# Patient Record
Sex: Female | Born: 1950
Health system: Southern US, Community
[De-identification: ages and names within clinical notes are randomized; demographics above are authoritative.]

## PROBLEM LIST (undated history)

## (undated) DIAGNOSIS — R0600 Dyspnea, unspecified: Secondary | ICD-10-CM

## (undated) DIAGNOSIS — E119 Type 2 diabetes mellitus without complications: Secondary | ICD-10-CM

## (undated) DIAGNOSIS — I10 Essential (primary) hypertension: Secondary | ICD-10-CM

## (undated) DIAGNOSIS — E785 Hyperlipidemia, unspecified: Secondary | ICD-10-CM

## (undated) DIAGNOSIS — D649 Anemia, unspecified: Secondary | ICD-10-CM

## (undated) DIAGNOSIS — Z9289 Personal history of other medical treatment: Secondary | ICD-10-CM

## (undated) HISTORY — PX: TONSILLECTOMY: SUR1361

## (undated) HISTORY — DX: Essential (primary) hypertension: I10

## (undated) HISTORY — DX: Type 2 diabetes mellitus without complications: E11.9

## (undated) HISTORY — DX: Hyperlipidemia, unspecified: E78.5

## (undated) HISTORY — PX: OTHER SURGICAL HISTORY: SHX169

---

## 1997-06-29 ENCOUNTER — Ambulatory Visit (HOSPITAL_COMMUNITY): Admission: RE | Admit: 1997-06-29 | Discharge: 1997-06-29 | Payer: Self-pay | Admitting: Family Medicine

## 1998-08-02 ENCOUNTER — Encounter: Payer: Self-pay | Admitting: Family Medicine

## 1998-08-02 ENCOUNTER — Ambulatory Visit (HOSPITAL_COMMUNITY): Admission: RE | Admit: 1998-08-02 | Discharge: 1998-08-02 | Payer: Self-pay | Admitting: Family Medicine

## 1999-08-29 ENCOUNTER — Ambulatory Visit (HOSPITAL_COMMUNITY): Admission: RE | Admit: 1999-08-29 | Discharge: 1999-08-29 | Payer: Self-pay | Admitting: Family Medicine

## 1999-08-29 ENCOUNTER — Encounter: Payer: Self-pay | Admitting: Family Medicine

## 1999-10-11 ENCOUNTER — Other Ambulatory Visit: Admission: RE | Admit: 1999-10-11 | Discharge: 1999-10-11 | Payer: Self-pay | Admitting: Family Medicine

## 1999-10-13 ENCOUNTER — Encounter: Payer: Self-pay | Admitting: Family Medicine

## 1999-10-13 ENCOUNTER — Encounter: Admission: RE | Admit: 1999-10-13 | Discharge: 1999-10-13 | Payer: Self-pay | Admitting: Family Medicine

## 2001-02-25 ENCOUNTER — Encounter: Payer: Self-pay | Admitting: Family Medicine

## 2001-02-25 ENCOUNTER — Ambulatory Visit (HOSPITAL_COMMUNITY): Admission: RE | Admit: 2001-02-25 | Discharge: 2001-02-25 | Payer: Self-pay | Admitting: Family Medicine

## 2002-02-26 ENCOUNTER — Ambulatory Visit (HOSPITAL_COMMUNITY): Admission: RE | Admit: 2002-02-26 | Discharge: 2002-02-26 | Payer: Self-pay | Admitting: Family Medicine

## 2002-02-26 ENCOUNTER — Encounter: Payer: Self-pay | Admitting: Family Medicine

## 2003-03-01 ENCOUNTER — Ambulatory Visit (HOSPITAL_COMMUNITY): Admission: RE | Admit: 2003-03-01 | Discharge: 2003-03-01 | Payer: Self-pay | Admitting: Family Medicine

## 2003-04-12 ENCOUNTER — Other Ambulatory Visit: Admission: RE | Admit: 2003-04-12 | Discharge: 2003-04-12 | Payer: Self-pay | Admitting: Family Medicine

## 2004-01-31 ENCOUNTER — Encounter (INDEPENDENT_AMBULATORY_CARE_PROVIDER_SITE_OTHER): Payer: Self-pay | Admitting: *Deleted

## 2004-01-31 ENCOUNTER — Ambulatory Visit (HOSPITAL_COMMUNITY): Admission: RE | Admit: 2004-01-31 | Discharge: 2004-01-31 | Payer: Self-pay | Admitting: Gastroenterology

## 2004-03-28 ENCOUNTER — Ambulatory Visit (HOSPITAL_COMMUNITY): Admission: RE | Admit: 2004-03-28 | Discharge: 2004-03-28 | Payer: Self-pay | Admitting: Family Medicine

## 2005-05-02 ENCOUNTER — Ambulatory Visit (HOSPITAL_COMMUNITY): Admission: RE | Admit: 2005-05-02 | Discharge: 2005-05-02 | Payer: Self-pay | Admitting: Family Medicine

## 2005-05-10 ENCOUNTER — Other Ambulatory Visit: Admission: RE | Admit: 2005-05-10 | Discharge: 2005-05-10 | Payer: Self-pay | Admitting: Family Medicine

## 2006-05-14 ENCOUNTER — Ambulatory Visit (HOSPITAL_COMMUNITY): Admission: RE | Admit: 2006-05-14 | Discharge: 2006-05-14 | Payer: Self-pay | Admitting: Family Medicine

## 2006-05-24 ENCOUNTER — Other Ambulatory Visit: Admission: RE | Admit: 2006-05-24 | Discharge: 2006-05-24 | Payer: Self-pay | Admitting: Family Medicine

## 2007-08-19 ENCOUNTER — Ambulatory Visit (HOSPITAL_COMMUNITY): Admission: RE | Admit: 2007-08-19 | Discharge: 2007-08-19 | Payer: Self-pay | Admitting: Family Medicine

## 2007-09-09 ENCOUNTER — Other Ambulatory Visit: Admission: RE | Admit: 2007-09-09 | Discharge: 2007-09-09 | Payer: Self-pay | Admitting: Family Medicine

## 2008-10-28 ENCOUNTER — Ambulatory Visit (HOSPITAL_COMMUNITY): Admission: RE | Admit: 2008-10-28 | Discharge: 2008-10-28 | Payer: Self-pay | Admitting: Family Medicine

## 2008-11-23 ENCOUNTER — Other Ambulatory Visit: Admission: RE | Admit: 2008-11-23 | Discharge: 2008-11-23 | Payer: Self-pay | Admitting: Family Medicine

## 2010-01-23 ENCOUNTER — Ambulatory Visit (HOSPITAL_COMMUNITY): Admission: RE | Admit: 2010-01-23 | Discharge: 2010-01-23 | Payer: Self-pay | Admitting: Family Medicine

## 2010-03-21 ENCOUNTER — Other Ambulatory Visit
Admission: RE | Admit: 2010-03-21 | Discharge: 2010-03-21 | Payer: Self-pay | Source: Home / Self Care | Admitting: Family Medicine

## 2010-05-12 ENCOUNTER — Other Ambulatory Visit: Payer: Self-pay | Admitting: Dermatology

## 2010-08-04 NOTE — Op Note (Signed)
Courtney Braun, Courtney Braun NO.:  [id number]   MEDICAL RECORD NO.:  [id number]          PATIENT TYPE:  AMB   LOCATION:  ENDO                         FACILITY:  [facility]   PHYSICIAN:  Jeryl Columbia, M.D.    DATE OF BIRTH:  February 07, 1951   DATE OF PROCEDURE:  01/31/2004  DATE OF DISCHARGE:                                 OPERATIVE REPORT   PROCEDURE:  EGD with biopsy.   INDICATIONS FOR PROCEDURE:  Patient with episodic upper tract symptoms,  longstanding.  Want to rule out Barrett's or other etiologies.  Consent was  signed after the risks, benefits, methods, and options were thoroughly  discussed in the office.   MEDICATIONS GIVEN:  Fentanyl 25 mcg, Versed 2 mg.   DESCRIPTION OF PROCEDURE:  The video endoscope was inserted by direct  vision.  The esophagus was normal.  On the distal esophagus was a tiny  hiatal hernia.  The scope passed into the stomach and into the antrum where  some minimal antritis was seen.  It was advanced through a normal pylorus  into a normal duodenal bulb and around the __________ to a normal second  portion of the duodenum.  The scope was withdrawn back to the bulb, and a  good look there __________ location.  The scope was withdrawn back into the  stomach and retroflexed.  The angularis, cardia, and fundus were pertinent  for hiatal hernia being confirmed in the cardia as well as some proximal  gastritis.  Straight visualization of the stomach did not reveal any  additional findings, except for the proximal greater than distal gastritis.  Retroflexion of the lesser and greater curve, again, did not show any  additional findings.  We went ahead and took a few biopsies of the antrum, a  few of the proximal stomach to confirm the gastritis and rule out  Helicobacter.  Air was suctioned, and the scope was slowly withdrawn.  Again, a good look at the esophagus was normal.  The scope was removed.  The  patient tolerated the procedure well.  There were  no obvious immediate  complications.   ENDOSCOPIC DIAGNOSES:  1.  Tiny hiatal hernia.  2.  Minimal proximal gastritis and mild antritis status post biopsy.  3.  Otherwise normal esophagogastroduodenoscopy.   PLAN:  1.  Consider pump inhibitor use p.r.n.  2.  Await pathology.  3.  Happy to see back p.r.n.;  otherwise, return care to Dr. Roderick Pee for      the customary health management to include yearly rectals and guaiacs.      MEM/MEDQ  D:  01/31/2004  T:  01/31/2004  Job:  [id number]   cc:   Osvaldo Human, M.D.  [address]  [place] [address]  Fax: [phone]

## 2010-08-04 NOTE — Op Note (Signed)
NAMETERREA, BRUSTER NO.:  [id number]   MEDICAL RECORD NO.:  [id number]          PATIENT TYPE:  AMB   LOCATION:  ENDO                         FACILITY:  [facility]   PHYSICIAN:  Jeryl Columbia, M.D.    DATE OF BIRTH:  1951-02-21   DATE OF PROCEDURE:  01/31/2004  DATE OF DISCHARGE:                                 OPERATIVE REPORT   PROCEDURE:  Colonoscopy.   INDICATION:  Screening.  Consent was signed after risks, benefits, methods,  options thoroughly discussed in the office.   MEDICINES USED:  1.  Fentanyl 75 mcg.  2.  Versed 7 mg.   DESCRIPTION OF PROCEDURE:  Rectal inspection was pertinent for external  hemorrhoids, small.  Digital exam was negative.  The video colonoscope was  inserted, easily advanced around the colon to the cecum.  This did require  abdominal pressure but no position changes.  No abnormality was seen on  insertion.  The cecum was identified by the appendiceal orifice and the  ileocecal valve.  The scope was slowly withdrawn.  The prep was adequate.  It did require a liter of washing and suctioning for adequate visualization  but on slow withdrawal through the colon, the cecum and the ascending were  normal.  In the mid transverse, a questionable small sessile polyp was seen  and was hot biopsied x 1.  The scope was further withdrawn.  There were a  few very rare tiny left-sided diverticula just beginning but no other  abnormalities.  Once back in the rectum, anorectal pull-through and  retroflexion confirmed some small hemorrhoids.  The scope was straightened  and readvanced a short ways up the left side of the colon; air was  suctioned, scope removed.  The patient tolerated the procedure well.  There  was no obvious immediate complication.   ENDOSCOPIC DIAGNOSES:  1.  Internal and external hemorrhoids.  2.  Very rare tiny left-sided diverticula.  3.  Questionable transverse small polyp, hot biopsied.  4.  Otherwise, within normal limits  to the cecum.   PLAN:  1.  Await pathology but probably recheck colon screening in 5 years.  2.  Continue work-up with a one-time EGD to rule out Barrett's and other      etiologies.      MEM/MEDQ  D:  01/31/2004  T:  01/31/2004  Job:  [id number]   cc:   Osvaldo Human, M.D.  [address]  [place] [address]  Fax: [phone]

## 2011-03-28 ENCOUNTER — Other Ambulatory Visit (HOSPITAL_COMMUNITY): Payer: Self-pay | Admitting: Family Medicine

## 2013-04-27 ENCOUNTER — Other Ambulatory Visit (HOSPITAL_COMMUNITY): Payer: Self-pay | Admitting: Family Medicine

## 2013-04-27 DIAGNOSIS — Z1231 Encounter for screening mammogram for malignant neoplasm of breast: Secondary | ICD-10-CM

## 2013-04-29 ENCOUNTER — Ambulatory Visit (HOSPITAL_COMMUNITY)
Admission: RE | Admit: 2013-04-29 | Discharge: 2013-04-29 | Disposition: A | Discharge status: Home / Self Care | Payer: Medicare PPO | Source: Ambulatory Visit | Attending: Family Medicine | Admitting: Family Medicine

## 2013-04-29 DIAGNOSIS — Z1231 Encounter for screening mammogram for malignant neoplasm of breast: Secondary | ICD-10-CM | POA: Insufficient documentation

## 2013-06-04 ENCOUNTER — Encounter: Payer: Medicare PPO | Attending: Family Medicine

## 2013-06-04 VITALS — Ht 72.0 in | Wt 267.6 lb

## 2013-06-04 DIAGNOSIS — Z713 Dietary counseling and surveillance: Secondary | ICD-10-CM | POA: Insufficient documentation

## 2013-06-04 DIAGNOSIS — E119 Type 2 diabetes mellitus without complications: Secondary | ICD-10-CM

## 2013-06-04 NOTE — Progress Notes (Signed)
Patient was seen on 06/04/13 for the first of a series of three diabetes self-management courses at the [facility].  Current HbA1c: 8.5%  The following learning objectives were met by the patient during this class:  Describe diabetes  State some common risk factors for diabetes  Defines the role of glucose and insulin  Identifies type of diabetes and pathophysiology  Describe the relationship between diabetes and cardiovascular risk  State the members of the Healthcare Team  States the rationale for glucose monitoring  State when to test glucose  State their individual Target Range  State the importance of logging glucose readings  Describe how to interpret glucose readings  Identifies A1C target  Explain the correlation between A1c and eAG values  State symptoms and treatment of high blood glucose  State symptoms and treatment of low blood glucose  Explain proper technique for glucose testing  Identifies proper sharps disposal  Handouts given during class include:  Living Well with Diabetes book  Carb Counting and Meal Planning book  Meal Plan Card  Carbohydrate guide  Meal planning worksheet  Low Sodium Flavoring Tips  The diabetes portion plate  D4Y to eAG Conversion Chart  Diabetes Medications  Diabetes Recommended Care Schedule  Support Group  Diabetes Success Plan  Core Class Satisfaction Survey  Follow-Up Plan:  Attend core 2

## 2013-06-11 DIAGNOSIS — E119 Type 2 diabetes mellitus without complications: Secondary | ICD-10-CM

## 2013-06-11 NOTE — Progress Notes (Signed)
Patient was seen on 06/10/13 for the second of a series of three diabetes self-management courses at the [facility]. The following learning objectives were met by the patient during this class:   Describe the role of different macronutrients on glucose  Explain how carbohydrates affect blood glucose  State what foods contain the most carbohydrates  Demonstrate carbohydrate counting  Demonstrate how to read Nutrition Facts food label  Describe effects of various fats on heart health  Describe the importance of good nutrition for health and healthy eating strategies  Describe techniques for managing your shopping, cooking and meal planning  List strategies to follow meal plan when dining out  Describe the effects of alcohol on glucose and how to use it safely  Goals:  Follow Diabetes Meal Plan as instructed  Eat 3 meals and 2 snacks, every 3-5 hrs  Aim for carbohydrate intake to 45 grams carbohydrate/meal Aim for carbohydrate intake to 15 grams carbohydrate/snack Add lean protein foods to meals/snacks  Monitor glucose levels as instructed by your doctor   Follow-Up Plan:  Attend Core 3  Work towards following your personal food plan.

## 2013-06-18 ENCOUNTER — Encounter: Payer: Medicare PPO | Attending: Family Medicine

## 2013-06-18 DIAGNOSIS — E119 Type 2 diabetes mellitus without complications: Secondary | ICD-10-CM

## 2013-06-18 DIAGNOSIS — Z713 Dietary counseling and surveillance: Secondary | ICD-10-CM | POA: Insufficient documentation

## 2013-06-18 NOTE — Progress Notes (Signed)
Patient was seen on 06/18/13 for the third of a series of three diabetes self-management courses at the [facility]. The following learning objectives were met by the patient during this class:    State the amount of activity recommended for healthy living   Describe activities suitable for individual needs   Identify ways to regularly incorporate activity into daily life   Identify barriers to activity and ways to over come these barriers  Identify diabetes medications being personally used and their primary action for lowering glucose and possible side effects   Describe role of stress on blood glucose and develop strategies to address psychosocial issues   Identify diabetes complications and ways to prevent them  Explain how to manage diabetes during illness   Evaluate success in meeting personal goal   Establish 2-3 goals that they will plan to diligently work on until they return for the  33-monthfollow-up visit  Goals:  Follow Diabetes Meal Plan as instructed  Aim for 15-30 mins of physical activity daily as tolerated  Bring food record and glucose log to your follow up visit  Your patient has established the following 4 month goals in their individualized success plan: I will count my carb choices at most meals and snacks I will increase my activity level at least 30 minutes 3 days a week I will take my diabetes medications as scheduled I will test my glucose at least 2 times a day, 7 days a week I will look at patterns in my record book at least 1 days a week To help manage stress I will do physical activity at least 3 times a week  Your patient has identified these potential barriers to change:  None noted  Your patient has identified their diabetes self-care support plan as  [facility]  Plan:  Attend Core 4 in 4 months

## 2013-10-19 ENCOUNTER — Encounter: Payer: Medicare PPO | Attending: Family Medicine

## 2013-10-19 DIAGNOSIS — E119 Type 2 diabetes mellitus without complications: Secondary | ICD-10-CM | POA: Diagnosis present

## 2013-10-19 DIAGNOSIS — Z713 Dietary counseling and surveillance: Secondary | ICD-10-CM | POA: Diagnosis present

## 2013-10-20 NOTE — Progress Notes (Signed)
Patient was seen on 10/19/2013 for a review of the series of three diabetes self-management courses at the [facility]. The following learning objectives were met by the patient during this class:    Reviewed blood glucose monitoring and interpretation including the recommended target ranges and Hgb A1c.    Reviewed on carb counting, importance of regularly scheduled meals/snacks, and meal planning.    Reviewed the effects of physical activity on glucose levels and long-term glucose control.  Recommended goal of 150 minutes of physical activity/week.   Reviewed patient medications and discussed role of medication on blood glucose and possible side effects.   Discussed strategies to manage stress, psychosocial issues, and other obstacles to diabetes management.   Encouraged moderate weight reduction to improve glucose levels.     Reviewed short-term complications: hyper- and hypo-glycemia.  Discussed causes, symptoms, and treatment options.   Reviewed prevention, detection, and treatment of long-term complications.  Discussed the role of prolonged elevated glucose levels on body systems.  Goals:  Follow Diabetes Meal Plan as instructed  Eat 3 meals and 2 snacks, every 3-5 hrs  Limit carbohydrate intake to 45 grams carbohydrate/meal Limit carbohydrate intake to 15 grams carbohydrate/snack Add lean protein foods to meals/snacks  Monitor glucose levels as instructed by your doctor  Aim for goal of 15-30 mins of physical activity daily as tolerated  Bring food record and glucose log to your next nutrition visit   

## 2013-10-20 NOTE — Patient Instructions (Signed)
Goals:  Follow Diabetes Meal Plan as instructed  Eat 3 meals and 2 snacks, every 3-5 hrs  Limit carbohydrate intake to 45 grams carbohydrate/meal Limit carbohydrate intake to 15 grams carbohydrate/snack Add lean protein foods to meals/snacks  Monitor glucose levels as instructed by your doctor  Aim for goal of 15-30 mins of physical activity daily as tolerated  Bring food record and glucose log to your next nutrition visit

## 2014-01-21 ENCOUNTER — Other Ambulatory Visit: Payer: Self-pay | Admitting: Gastroenterology

## 2014-04-22 ENCOUNTER — Other Ambulatory Visit (HOSPITAL_COMMUNITY): Payer: Self-pay | Admitting: Family Medicine

## 2014-04-22 DIAGNOSIS — Z1231 Encounter for screening mammogram for malignant neoplasm of breast: Secondary | ICD-10-CM

## 2014-04-30 ENCOUNTER — Ambulatory Visit (HOSPITAL_COMMUNITY): Payer: Medicare PPO

## 2014-05-07 ENCOUNTER — Ambulatory Visit (HOSPITAL_COMMUNITY)
Admission: RE | Admit: 2014-05-07 | Discharge: 2014-05-07 | Disposition: A | Discharge status: Home / Self Care | Payer: Medicare PPO | Source: Ambulatory Visit | Attending: Family Medicine | Admitting: Family Medicine

## 2014-05-07 DIAGNOSIS — Z1231 Encounter for screening mammogram for malignant neoplasm of breast: Secondary | ICD-10-CM | POA: Insufficient documentation

## 2014-05-07 DIAGNOSIS — I10 Essential (primary) hypertension: Secondary | ICD-10-CM | POA: Diagnosis not present

## 2014-05-07 DIAGNOSIS — Z0000 Encounter for general adult medical examination without abnormal findings: Secondary | ICD-10-CM | POA: Diagnosis not present

## 2014-05-07 DIAGNOSIS — E782 Mixed hyperlipidemia: Secondary | ICD-10-CM | POA: Diagnosis not present

## 2014-05-07 DIAGNOSIS — Z23 Encounter for immunization: Secondary | ICD-10-CM | POA: Diagnosis not present

## 2014-05-07 DIAGNOSIS — R748 Abnormal levels of other serum enzymes: Secondary | ICD-10-CM | POA: Diagnosis not present

## 2014-05-07 DIAGNOSIS — E119 Type 2 diabetes mellitus without complications: Secondary | ICD-10-CM | POA: Diagnosis not present

## 2014-05-07 DIAGNOSIS — Z6836 Body mass index (BMI) 36.0-36.9, adult: Secondary | ICD-10-CM | POA: Diagnosis not present

## 2014-05-12 DIAGNOSIS — H25093 Other age-related incipient cataract, bilateral: Secondary | ICD-10-CM | POA: Diagnosis not present

## 2014-05-12 DIAGNOSIS — H43392 Other vitreous opacities, left eye: Secondary | ICD-10-CM | POA: Diagnosis not present

## 2014-07-19 ENCOUNTER — Ambulatory Visit
Admission: RE | Admit: 2014-07-19 | Discharge: 2014-07-19 | Disposition: A | Discharge status: Home / Self Care | Payer: Commercial Managed Care - HMO | Source: Ambulatory Visit | Attending: Physician Assistant | Admitting: Physician Assistant

## 2014-07-19 ENCOUNTER — Other Ambulatory Visit: Payer: Self-pay | Admitting: Physician Assistant

## 2014-07-19 DIAGNOSIS — W19XXXA Unspecified fall, initial encounter: Secondary | ICD-10-CM

## 2014-07-19 DIAGNOSIS — M25511 Pain in right shoulder: Secondary | ICD-10-CM | POA: Diagnosis not present

## 2014-07-19 DIAGNOSIS — S2231XA Fracture of one rib, right side, initial encounter for closed fracture: Secondary | ICD-10-CM | POA: Diagnosis not present

## 2014-07-19 DIAGNOSIS — R0789 Other chest pain: Secondary | ICD-10-CM | POA: Diagnosis not present

## 2014-07-19 DIAGNOSIS — S4991XA Unspecified injury of right shoulder and upper arm, initial encounter: Secondary | ICD-10-CM | POA: Diagnosis not present

## 2014-07-19 DIAGNOSIS — S40811A Abrasion of right upper arm, initial encounter: Secondary | ICD-10-CM | POA: Diagnosis not present

## 2014-07-19 DIAGNOSIS — Z23 Encounter for immunization: Secondary | ICD-10-CM | POA: Diagnosis not present

## 2014-09-01 DIAGNOSIS — L03116 Cellulitis of left lower limb: Secondary | ICD-10-CM | POA: Diagnosis not present

## 2014-11-25 DIAGNOSIS — Z23 Encounter for immunization: Secondary | ICD-10-CM | POA: Diagnosis not present

## 2014-11-25 DIAGNOSIS — Z6835 Body mass index (BMI) 35.0-35.9, adult: Secondary | ICD-10-CM | POA: Diagnosis not present

## 2014-11-25 DIAGNOSIS — E782 Mixed hyperlipidemia: Secondary | ICD-10-CM | POA: Diagnosis not present

## 2014-11-25 DIAGNOSIS — E119 Type 2 diabetes mellitus without complications: Secondary | ICD-10-CM | POA: Diagnosis not present

## 2014-11-25 DIAGNOSIS — I10 Essential (primary) hypertension: Secondary | ICD-10-CM | POA: Diagnosis not present

## 2014-12-29 DIAGNOSIS — H524 Presbyopia: Secondary | ICD-10-CM | POA: Diagnosis not present

## 2014-12-29 DIAGNOSIS — H5210 Myopia, unspecified eye: Secondary | ICD-10-CM | POA: Diagnosis not present

## 2015-03-25 DIAGNOSIS — E119 Type 2 diabetes mellitus without complications: Secondary | ICD-10-CM | POA: Diagnosis not present

## 2015-03-25 DIAGNOSIS — Z6835 Body mass index (BMI) 35.0-35.9, adult: Secondary | ICD-10-CM | POA: Diagnosis not present

## 2015-03-25 DIAGNOSIS — I10 Essential (primary) hypertension: Secondary | ICD-10-CM | POA: Diagnosis not present

## 2015-03-25 DIAGNOSIS — E782 Mixed hyperlipidemia: Secondary | ICD-10-CM | POA: Diagnosis not present

## 2015-03-25 DIAGNOSIS — Z7984 Long term (current) use of oral hypoglycemic drugs: Secondary | ICD-10-CM | POA: Diagnosis not present

## 2015-04-26 ENCOUNTER — Other Ambulatory Visit: Payer: Self-pay

## 2015-04-26 DIAGNOSIS — Z1231 Encounter for screening mammogram for malignant neoplasm of breast: Secondary | ICD-10-CM

## 2015-06-14 ENCOUNTER — Ambulatory Visit
Admission: RE | Admit: 2015-06-14 | Discharge: 2015-06-14 | Disposition: A | Discharge status: Home / Self Care | Payer: Commercial Managed Care - HMO | Source: Ambulatory Visit

## 2015-06-14 DIAGNOSIS — Z1231 Encounter for screening mammogram for malignant neoplasm of breast: Secondary | ICD-10-CM

## 2015-08-03 DIAGNOSIS — L309 Dermatitis, unspecified: Secondary | ICD-10-CM | POA: Diagnosis not present

## 2015-08-03 DIAGNOSIS — M8580 Other specified disorders of bone density and structure, unspecified site: Secondary | ICD-10-CM | POA: Diagnosis not present

## 2015-08-03 DIAGNOSIS — R748 Abnormal levels of other serum enzymes: Secondary | ICD-10-CM | POA: Diagnosis not present

## 2015-08-03 DIAGNOSIS — I10 Essential (primary) hypertension: Secondary | ICD-10-CM | POA: Diagnosis not present

## 2015-08-03 DIAGNOSIS — E782 Mixed hyperlipidemia: Secondary | ICD-10-CM | POA: Diagnosis not present

## 2015-08-03 DIAGNOSIS — E119 Type 2 diabetes mellitus without complications: Secondary | ICD-10-CM | POA: Diagnosis not present

## 2015-08-03 DIAGNOSIS — Z0000 Encounter for general adult medical examination without abnormal findings: Secondary | ICD-10-CM | POA: Diagnosis not present

## 2015-08-03 DIAGNOSIS — Z6836 Body mass index (BMI) 36.0-36.9, adult: Secondary | ICD-10-CM | POA: Diagnosis not present

## 2015-09-07 DIAGNOSIS — M8588 Other specified disorders of bone density and structure, other site: Secondary | ICD-10-CM | POA: Diagnosis not present

## 2015-09-26 DIAGNOSIS — L309 Dermatitis, unspecified: Secondary | ICD-10-CM | POA: Diagnosis not present

## 2016-01-16 DIAGNOSIS — H524 Presbyopia: Secondary | ICD-10-CM | POA: Diagnosis not present

## 2016-01-16 DIAGNOSIS — E119 Type 2 diabetes mellitus without complications: Secondary | ICD-10-CM | POA: Diagnosis not present

## 2016-04-25 DIAGNOSIS — I10 Essential (primary) hypertension: Secondary | ICD-10-CM | POA: Diagnosis not present

## 2016-04-25 DIAGNOSIS — E782 Mixed hyperlipidemia: Secondary | ICD-10-CM | POA: Diagnosis not present

## 2016-04-25 DIAGNOSIS — E119 Type 2 diabetes mellitus without complications: Secondary | ICD-10-CM | POA: Diagnosis not present

## 2016-04-30 DIAGNOSIS — E119 Type 2 diabetes mellitus without complications: Secondary | ICD-10-CM | POA: Diagnosis not present

## 2016-07-06 ENCOUNTER — Ambulatory Visit (HOSPITAL_COMMUNITY)
Admission: EM | Admit: 2016-07-06 | Discharge: 2016-07-06 | Disposition: A | Discharge status: Home / Self Care | Payer: Medicare HMO | Attending: Internal Medicine | Admitting: Internal Medicine

## 2016-07-06 ENCOUNTER — Encounter (HOSPITAL_COMMUNITY): Payer: Self-pay | Admitting: Emergency Medicine

## 2016-07-06 DIAGNOSIS — A084 Viral intestinal infection, unspecified: Secondary | ICD-10-CM

## 2016-07-06 DIAGNOSIS — R197 Diarrhea, unspecified: Secondary | ICD-10-CM | POA: Diagnosis not present

## 2016-07-06 DIAGNOSIS — R1110 Vomiting, unspecified: Secondary | ICD-10-CM | POA: Diagnosis not present

## 2016-07-06 MED ORDER — ONDANSETRON 4 MG PO TBDP
ORAL_TABLET | ORAL | Status: AC
  Filled 2016-07-06: qty 2

## 2016-07-06 MED ORDER — DICYCLOMINE HCL 20 MG PO TABS: 20.0000 mg | ORAL_TABLET | Freq: Two times a day (BID) | ORAL | 0 refills | Status: DC

## 2016-07-06 MED ORDER — ONDANSETRON 4 MG PO TBDP
8.0000 mg | ORAL_TABLET | Freq: Once | ORAL | Status: AC
  Administered 2016-07-06: 8 mg via ORAL

## 2016-07-06 MED ORDER — OMEPRAZOLE 40 MG PO CPDR: 40.0000 mg | DELAYED_RELEASE_CAPSULE | Freq: Two times a day (BID) | ORAL | 0 refills | Status: DC

## 2016-07-06 MED ORDER — GI COCKTAIL ~~LOC~~
ORAL | Status: AC
  Filled 2016-07-06: qty 30

## 2016-07-06 MED ORDER — DIPHENOXYLATE-ATROPINE 2.5-0.025 MG PO TABS: 1.0000 | ORAL_TABLET | Freq: Four times a day (QID) | ORAL | 0 refills | Status: DC | PRN

## 2016-07-06 MED ORDER — GI COCKTAIL ~~LOC~~
30.0000 mL | Freq: Once | ORAL | Status: AC
  Administered 2016-07-06: 30 mL via ORAL

## 2016-07-06 MED ORDER — ONDANSETRON 4 MG PO TBDP: 4.0000 mg | ORAL_TABLET | Freq: Three times a day (TID) | ORAL | 0 refills | Status: DC | PRN

## 2016-07-06 NOTE — ED Triage Notes (Signed)
PT reports diarrhea that started at 1600 yesterday. PT reports vomiting started at 0400. PT reports 10 episodes diarrhea and 8 episodes vomiting since onset. PT denies abdominal pain at this time.

## 2016-07-06 NOTE — ED Provider Notes (Signed)
CSN: [id number]     Arrival date & time 07/06/16  1338 History   First MD Initiated Contact with Patient 07/06/16 1413     Chief Complaint  Patient presents with  . Emesis  . Diarrhea   (Consider location/radiation/quality/duration/timing/severity/associated sxs/prior Treatment) 66 year old female presents to clinic for nausea, vomiting, and diarrhea starting last night.   The history is provided by the patient.  Emesis  Severity:  Moderate Duration:  1 day Timing:  Constant Number of daily episodes:  5 Quality:  Stomach contents Progression:  Worsening Chronicity:  New Recent urination:  Normal Context: not post-tussive and not self-induced   Relieved by:  None tried Worsened by:  Food smell Ineffective treatments:  None tried Associated symptoms: abdominal pain and diarrhea   Associated symptoms: no chills, no cough, no fever, no headaches, no myalgias, no sore throat and no URI   Diarrhea:    Quality:  Explosive, malodorous and watery   Number of occurrences:  8-10   Severity:  Moderate   Duration:  1 day   Timing:  Constant   Progression:  Worsening Risk factors: diabetes   Risk factors: no alcohol use, no suspect food intake and no travel to endemic areas   Diarrhea  Associated symptoms: abdominal pain and vomiting   Associated symptoms: no chills, no fever, no headaches, no myalgias and no URI     Past Medical History:  Diagnosis Date  . Diabetes mellitus without complication ([place])   . Hyperlipidemia   . Hypertension    Past Surgical History:  Procedure Laterality Date  . none    . TONSILLECTOMY     Family History  Problem Relation Age of Onset  . Hyperlipidemia Other    Social History  Substance Use Topics  . Smoking status: Never Smoker  . Smokeless tobacco: Never Used  . Alcohol use Yes     Comment: occasionally   OB History    No data available     Review of Systems  Constitutional: Negative for chills and fever.  HENT: Negative for  congestion, sinus pain, sinus pressure, sore throat and trouble swallowing.   Eyes: Negative.   Respiratory: Negative for cough and shortness of breath.   Cardiovascular: Negative.   Gastrointestinal: Positive for abdominal pain, diarrhea and vomiting.  Genitourinary: Negative.   Musculoskeletal: Negative for myalgias, neck pain and neck stiffness.  Skin: Negative.   Neurological: Positive for light-headedness. Negative for dizziness and headaches.  All other systems reviewed and are negative.   Allergies  Patient has no known allergies.  Home Medications   Prior to Admission medications   Medication Sig Start Date End Date Taking? Authorizing Provider  aspirin 81 MG tablet Take 81 mg by mouth daily.   Yes Historical Provider, MD  lisinopril-hydrochlorothiazide (PRINZIDE,ZESTORETIC) 20-12.5 MG per tablet Take 1 tablet by mouth daily.   Yes Historical Provider, MD  metFORMIN (GLUCOPHAGE) 500 MG tablet Take 500 mg by mouth 2 (two) times daily with a meal.   Yes Historical Provider, MD  simvastatin (ZOCOR) 20 MG tablet Take 20 mg by mouth daily.   Yes Historical Provider, MD  dicyclomine (BENTYL) 20 MG tablet Take 1 tablet (20 mg total) by mouth 2 (two) times daily. 07/06/16   Barnet Glasgow, NP  diphenoxylate-atropine (LOMOTIL) 2.5-0.025 MG tablet Take 1 tablet by mouth 4 (four) times daily as needed for diarrhea or loose stools. 07/06/16   Barnet Glasgow, NP  omeprazole (PRILOSEC) 40 MG capsule Take 1 capsule (40  mg total) by mouth 2 (two) times daily. 07/06/16 07/20/16  Barnet Glasgow, NP  ondansetron (ZOFRAN ODT) 4 MG disintegrating tablet Take 1 tablet (4 mg total) by mouth every 8 (eight) hours as needed for nausea or vomiting. 07/06/16   Barnet Glasgow, NP   Meds Ordered and Administered this Visit   Medications  ondansetron (ZOFRAN-ODT) disintegrating tablet 8 mg (8 mg Oral Given 07/06/16 1429)  gi cocktail (Maalox,Lidocaine,Donnatal) (30 mLs Oral Given 07/06/16 1429)    BP  113/66 (BP Location: Right Arm)   Pulse (!) 128   Temp 98.7 F (37.1 C) (Oral)   Resp 18   Ht 6' (1.829 m)   Wt 245 lb (111.1 kg)   SpO2 99%   BMI 33.23 kg/m  No data found.   Physical Exam  Constitutional: She is oriented to person, place, and time. She appears well-developed and well-nourished. No distress.  HENT:  Head: Normocephalic and atraumatic.  Right Ear: External ear normal.  Left Ear: External ear normal.  Eyes: Conjunctivae are normal. Right eye exhibits no discharge. Left eye exhibits no discharge.  Cardiovascular: Normal rate and regular rhythm.   Pulmonary/Chest: Effort normal and breath sounds normal.  Abdominal: Soft. Normal appearance and bowel sounds are normal. There is tenderness in the epigastric area. There is no rebound, no CVA tenderness, no tenderness at McBurney's point and negative Murphy's sign.  Neurological: She is alert and oriented to person, place, and time.  Skin: Skin is warm and dry. Capillary refill takes less than 2 seconds. She is not diaphoretic.  Psychiatric: She has a normal mood and affect. Her behavior is normal.  Nursing note and vitals reviewed.   Urgent Care Course     Procedures (including critical care time)  Labs Review Labs Reviewed - No data to display  Imaging Review No results found.    MDM   1. Viral gastroenteritis     Patient treated in clinic with a GI cocktail, and with Zofran. Prescription sent her pharmacy for Zofran, Prilosec, Bentyl, and Lomotil. Given counseling on clear liquid diet followed by simple foods such as bananas, rice, toast, provided follow-up counseling should her symptoms worsen to go to the emergency room, or return to clinic at anytime if her symptoms fail to resolve. Also encouraged to push fluids.    Barnet Glasgow, NP 07/06/16 1451

## 2016-07-06 NOTE — ED Triage Notes (Signed)
PT reports vomit has been "dark brown" and diarrhea has been black.

## 2016-07-06 NOTE — Discharge Instructions (Signed)
You have viral gastritis. I prescribed medications to help your symptoms.For Nausea, I have prescribed Zofran, take 1 tablet under the tongue every 8 hours as needed. For diarrhea, prescribed loperamide, take one tablet after each loose stool, not to exceed 4 tablets in any 24 hour period. I prescribed a medicine called Bentyl, take one tablet by mouth twice daily. And finally have prescribed a medicine called omeprazole, take 1 tablet by mouth daily. Should your symptoms persist, or fail to improve, follow up with your primary care provider or return to clinic as needed. If your symptoms worsen, particularly if you develop worsened abdominal pain, fever, signs or symptoms of severe dehydration, then go to the emergency room.

## 2016-07-30 DIAGNOSIS — R609 Edema, unspecified: Secondary | ICD-10-CM | POA: Diagnosis not present

## 2016-07-30 DIAGNOSIS — D649 Anemia, unspecified: Secondary | ICD-10-CM | POA: Diagnosis not present

## 2016-07-31 ENCOUNTER — Other Ambulatory Visit: Payer: Self-pay | Admitting: Gastroenterology

## 2016-07-31 DIAGNOSIS — D509 Iron deficiency anemia, unspecified: Secondary | ICD-10-CM

## 2016-07-31 DIAGNOSIS — R748 Abnormal levels of other serum enzymes: Secondary | ICD-10-CM | POA: Diagnosis not present

## 2016-07-31 DIAGNOSIS — D500 Iron deficiency anemia secondary to blood loss (chronic): Secondary | ICD-10-CM | POA: Diagnosis not present

## 2016-07-31 DIAGNOSIS — Z86010 Personal history of colonic polyps: Secondary | ICD-10-CM | POA: Diagnosis not present

## 2016-08-02 ENCOUNTER — Ambulatory Visit (HOSPITAL_COMMUNITY)
Admission: RE | Admit: 2016-08-02 | Discharge: 2016-08-02 | Disposition: A | Discharge status: Home / Self Care | Payer: Medicare HMO | Source: Ambulatory Visit | Attending: Gastroenterology | Admitting: Gastroenterology

## 2016-08-02 DIAGNOSIS — D649 Anemia, unspecified: Secondary | ICD-10-CM | POA: Diagnosis not present

## 2016-08-02 LAB — ABO/RH: ABO/RH(D): B NEG

## 2016-08-02 LAB — PREPARE RBC (CROSSMATCH)

## 2016-08-02 MED ORDER — FUROSEMIDE 10 MG/ML IJ SOLN
20.0000 mg | Freq: Once | INTRAMUSCULAR | Status: AC
  Administered 2016-08-02: 20 mg via INTRAVENOUS
  Filled 2016-08-02: qty 2

## 2016-08-02 MED ORDER — SODIUM CHLORIDE 0.9 % IV SOLN
Freq: Once | INTRAVENOUS | Status: AC
  Administered 2016-08-02: 10:00:00 via INTRAVENOUS

## 2016-08-02 NOTE — Discharge Instructions (Signed)
Blood Transfusion, Care After This sheet gives you information about how to care for yourself after your procedure. Your doctor may also give you more specific instructions. If you have problems or questions, contact your doctor. Follow these instructions at home:  Take over-the-counter and prescription medicines only as told by your doctor.  Go back to your normal activities as told by your doctor.  Follow instructions from your doctor about how to take care of the area where an IV tube was put into your vein (insertion site). Make sure you:  Wash your hands with soap and water before you change your bandage (dressing). If there is no soap and water, use hand sanitizer.  Change your bandage as told by your doctor.  Check your IV insertion site every day for signs of infection. Check for:  More redness, swelling, or pain.  More fluid or blood.  Warmth.  Pus or a bad smell. Contact a doctor if:  You have more redness, swelling, or pain around the IV insertion site..  You have more fluid or blood coming from the IV insertion site.  Your IV insertion site feels warm to the touch.  You have pus or a bad smell coming from the IV insertion site.  Your pee (urine) turns pink, red, or brown.  You feel weak after doing your normal activities. Get help right away if:  You have signs of a serious allergic or body defense (immune) system reaction, including:  Itchiness.  Hives.  Trouble breathing.  Anxiety.  Pain in your chest or lower back.  Fever, flushing, and chills.  Fast pulse.  Rash.  Watery poop (diarrhea).  Throwing up (vomiting).  Dark pee.  Serious headache.  Dizziness.  Stiff neck.  Yellow color in your face or the white parts of your eyes (jaundice). Summary  After a blood transfusion, return to your normal activities as told by your doctor.  Every day, check for signs of infection where the IV tube was put into your vein.  Some signs of  infection are warm skin, more redness and pain, more fluid or blood, and pus or a bad smell where the needle went in.  Contact your doctor if you feel weak or have any unusual symptoms. This information is not intended to replace advice given to you by your health care provider. Make sure you discuss any questions you have with your health care provider. Document Released: 03/26/2014 Document Revised: 10/28/2015 Document Reviewed: 10/28/2015 Elsevier Interactive Patient Education  2017 [employer].   Patient received 2 units of PRBCs today after being typed and screened.

## 2016-08-02 NOTE — Procedures (Signed)
[facility]  Procedure Note  Courtney Braun BTY:[id number] DOB: 12-12-1950 DOA: 08/02/2016   Ordering Provider: Clarene Essex, MD  Associated Diagnosis: Anemia  Procedure Note: Transfusion of 2 units PRBCs; IV push lasix   Condition During Procedure: Pt tolerated well; no complications noted   Condition at Discharge: Pt was alert, oriented, ambulatory; no complications noted  Nigel Sloop, [facility]

## 2016-08-03 ENCOUNTER — Encounter (HOSPITAL_COMMUNITY): Payer: Self-pay | Admitting: *Deleted

## 2016-08-03 ENCOUNTER — Other Ambulatory Visit: Payer: Self-pay | Admitting: Gastroenterology

## 2016-08-03 ENCOUNTER — Ambulatory Visit
Admission: RE | Admit: 2016-08-03 | Discharge: 2016-08-03 | Disposition: A | Discharge status: Home / Self Care | Payer: Medicare HMO | Source: Ambulatory Visit | Attending: Gastroenterology | Admitting: Gastroenterology

## 2016-08-03 DIAGNOSIS — K8000 Calculus of gallbladder with acute cholecystitis without obstruction: Secondary | ICD-10-CM | POA: Diagnosis not present

## 2016-08-03 DIAGNOSIS — D509 Iron deficiency anemia, unspecified: Secondary | ICD-10-CM

## 2016-08-03 LAB — BPAM RBC
Blood Product Expiration Date: 201805302359
Blood Product Expiration Date: 201806012359
ISSUE DATE / TIME: 201805170929
ISSUE DATE / TIME: 201805170929
UNIT TYPE AND RH: 1700
Unit Type and Rh: 1700

## 2016-08-03 LAB — TYPE AND SCREEN
ABO/RH(D): B NEG
ANTIBODY SCREEN: NEGATIVE
UNIT DIVISION: 0
UNIT DIVISION: 0

## 2016-08-03 MED ORDER — IOPAMIDOL (ISOVUE-300) INJECTION 61%
125.0000 mL | Freq: Once | INTRAVENOUS | Status: AC | PRN
  Administered 2016-08-03: 125 mL via INTRAVENOUS

## 2016-08-06 ENCOUNTER — Ambulatory Visit (HOSPITAL_COMMUNITY)
Admission: RE | Admit: 2016-08-06 | Discharge: 2016-08-06 | Disposition: A | Discharge status: Home / Self Care | Payer: Medicare HMO | Source: Ambulatory Visit | Attending: Gastroenterology | Admitting: Gastroenterology

## 2016-08-06 ENCOUNTER — Ambulatory Visit (HOSPITAL_COMMUNITY): Payer: Medicare HMO | Admitting: Certified Registered"

## 2016-08-06 ENCOUNTER — Encounter (HOSPITAL_COMMUNITY): Payer: Self-pay | Admitting: Certified Registered"

## 2016-08-06 ENCOUNTER — Encounter (HOSPITAL_COMMUNITY)
Admission: RE | Disposition: A | Discharge status: Home / Self Care | Payer: Self-pay | Source: Ambulatory Visit | Attending: Gastroenterology

## 2016-08-06 DIAGNOSIS — Z79899 Other long term (current) drug therapy: Secondary | ICD-10-CM | POA: Insufficient documentation

## 2016-08-06 DIAGNOSIS — R748 Abnormal levels of other serum enzymes: Secondary | ICD-10-CM | POA: Insufficient documentation

## 2016-08-06 DIAGNOSIS — K766 Portal hypertension: Secondary | ICD-10-CM | POA: Insufficient documentation

## 2016-08-06 DIAGNOSIS — I10 Essential (primary) hypertension: Secondary | ICD-10-CM | POA: Diagnosis not present

## 2016-08-06 DIAGNOSIS — I8500 Esophageal varices without bleeding: Secondary | ICD-10-CM | POA: Diagnosis not present

## 2016-08-06 DIAGNOSIS — K317 Polyp of stomach and duodenum: Secondary | ICD-10-CM | POA: Diagnosis not present

## 2016-08-06 DIAGNOSIS — Z6838 Body mass index (BMI) 38.0-38.9, adult: Secondary | ICD-10-CM | POA: Diagnosis not present

## 2016-08-06 DIAGNOSIS — Z7984 Long term (current) use of oral hypoglycemic drugs: Secondary | ICD-10-CM | POA: Insufficient documentation

## 2016-08-06 DIAGNOSIS — E119 Type 2 diabetes mellitus without complications: Secondary | ICD-10-CM | POA: Diagnosis not present

## 2016-08-06 DIAGNOSIS — E7800 Pure hypercholesterolemia, unspecified: Secondary | ICD-10-CM | POA: Diagnosis not present

## 2016-08-06 DIAGNOSIS — K449 Diaphragmatic hernia without obstruction or gangrene: Secondary | ICD-10-CM | POA: Diagnosis not present

## 2016-08-06 DIAGNOSIS — D509 Iron deficiency anemia, unspecified: Secondary | ICD-10-CM | POA: Insufficient documentation

## 2016-08-06 DIAGNOSIS — K3189 Other diseases of stomach and duodenum: Secondary | ICD-10-CM | POA: Diagnosis not present

## 2016-08-06 DIAGNOSIS — Z7982 Long term (current) use of aspirin: Secondary | ICD-10-CM | POA: Insufficient documentation

## 2016-08-06 HISTORY — DX: Dyspnea, unspecified: R06.00

## 2016-08-06 HISTORY — DX: Anemia, unspecified: D64.9

## 2016-08-06 HISTORY — PX: ESOPHAGOGASTRODUODENOSCOPY: SHX5428

## 2016-08-06 HISTORY — DX: Personal history of other medical treatment: Z92.89

## 2016-08-06 LAB — GLUCOSE, CAPILLARY
Glucose-Capillary: 110 mg/dL — ABNORMAL HIGH (ref 65–99)
Glucose-Capillary: 107 mg/dL — ABNORMAL HIGH (ref 65–99)

## 2016-08-06 SURGERY — EGD (ESOPHAGOGASTRODUODENOSCOPY)
Anesthesia: Monitor Anesthesia Care

## 2016-08-06 MED ORDER — LACTATED RINGERS IV SOLN
INTRAVENOUS | Status: DC | PRN
Start: 2016-08-06 — End: 2016-08-06
  Administered 2016-08-06: 12:00:00 via INTRAVENOUS

## 2016-08-06 MED ORDER — SODIUM CHLORIDE 0.9 % IV SOLN: INTRAVENOUS | Status: DC

## 2016-08-06 MED ORDER — PROPOFOL 500 MG/50ML IV EMUL
INTRAVENOUS | Status: DC | PRN
  Administered 2016-08-06: 100 ug/kg/min via INTRAVENOUS

## 2016-08-06 MED ORDER — PROPOFOL 10 MG/ML IV BOLUS
INTRAVENOUS | Status: DC | PRN
  Administered 2016-08-06: 20 mg via INTRAVENOUS
  Administered 2016-08-06: 10 mg via INTRAVENOUS

## 2016-08-06 MED ORDER — BUTAMBEN-TETRACAINE-BENZOCAINE 2-2-14 % EX AERO
INHALATION_SPRAY | CUTANEOUS | Status: DC | PRN
  Administered 2016-08-06: 1 via TOPICAL

## 2016-08-06 MED ORDER — LACTATED RINGERS IV SOLN: INTRAVENOUS | Status: DC

## 2016-08-06 MED ORDER — LIDOCAINE HCL (CARDIAC) 20 MG/ML IV SOLN
INTRAVENOUS | Status: DC | PRN
  Administered 2016-08-06: 40 mg via INTRAVENOUS

## 2016-08-06 NOTE — Progress Notes (Signed)
Courtney Braun 10:34 AM  Subjective: Patient doing better since her transfusion without signs of bleeding and no new complaints and has not done her guaiac cards nor has she started her iron and her CT was reviewed and other than her brother alcohol cirrhosis no other liver disease runs in the family and she has only been a social drinker her life Objective: Vital signs stable afebrile exam please see preassessment evaluation no new labs CT reviewed as above  Assessment: Microcytic anemia probably due to her cirrhosis of questionable etiology  Plan: We'll proceed with endoscopy with anesthesia assistance and further workup and plans pending those findings including proceeding with guaiacs then begin iron and repeat labs and proceed with liver serologies and possible paracentesis as well  [phone] After 5PM or if no answer call [phone]

## 2016-08-06 NOTE — Anesthesia Postprocedure Evaluation (Signed)
Anesthesia Post Note  Patient: EYVA CALIFANO  Procedure(s) Performed: Procedure(s) (LRB): ESOPHAGOGASTRODUODENOSCOPY (EGD) (N/A)  Patient location during evaluation: PACU Anesthesia Type: MAC Level of consciousness: awake and alert Pain management: pain level controlled Vital Signs Assessment: post-procedure vital signs reviewed and stable Respiratory status: spontaneous breathing, nonlabored ventilation, respiratory function stable and patient connected to nasal cannula oxygen Cardiovascular status: stable and blood pressure returned to baseline Anesthetic complications: no       Last Vitals:  Vitals:   08/06/16 1200 08/06/16 1210  BP: (!) 120/101 118/87  Pulse: 88 90  Resp: (!) 24 (!) 43  Temp:      Last Pain:  Vitals:   08/06/16 1149  TempSrc: Oral                 Saloma Cadena DAVID

## 2016-08-06 NOTE — Anesthesia Preprocedure Evaluation (Signed)
Anesthesia Evaluation  Patient identified by MRN, date of birth, ID band Patient awake    Reviewed: Allergy & Precautions, NPO status , Patient's Chart, lab work & pertinent test results  Airway Mallampati: I  TM Distance: >3 FB Neck ROM: Full    Dental   Pulmonary    Pulmonary exam normal        Cardiovascular hypertension, Pt. on medications Normal cardiovascular exam     Neuro/Psych    GI/Hepatic   Endo/Other  diabetes, Type 2, Oral Hypoglycemic Agents  Renal/GU      Musculoskeletal   Abdominal   Peds  Hematology   Anesthesia Other Findings   Reproductive/Obstetrics                             Anesthesia Physical Anesthesia Plan  ASA: II  Anesthesia Plan: MAC   Post-op Pain Management:    Induction: Intravenous  Airway Management Planned: Simple Face Mask  Additional Equipment:   Intra-op Plan:   Post-operative Plan:   Informed Consent: I have reviewed the patients History and Physical, chart, labs and discussed the procedure including the risks, benefits and alternatives for the proposed anesthesia with the patient or authorized representative who has indicated his/her understanding and acceptance.     Plan Discussed with: CRNA and Surgeon  Anesthesia Plan Comments:         Anesthesia Quick Evaluation

## 2016-08-06 NOTE — Transfer of Care (Signed)
Immediate Anesthesia Transfer of Care Note  Patient: Courtney Braun  Procedure(s) Performed: Procedure(s): ESOPHAGOGASTRODUODENOSCOPY (EGD) (N/A)  Patient Location: Endoscopy Unit  Anesthesia Type:MAC  Level of Consciousness: awake, alert  and oriented  Airway & Oxygen Therapy: Patient Spontanous Breathing and Patient connected to nasal cannula oxygen  Post-op Assessment: Report given to RN, Post -op Vital signs reviewed and stable and Patient moving all extremities X 4  Post vital signs: Reviewed and stable  Last Vitals:  Vitals:   08/06/16 1006  BP: (!) 131/54  Pulse: 93  Resp: 18  Temp: 36.7 C    Last Pain:  Vitals:   08/06/16 1006  TempSrc: Oral         Complications: No apparent anesthesia complications

## 2016-08-06 NOTE — Discharge Instructions (Signed)
YOU HAD AN ENDOSCOPIC PROCEDURE TODAY: Refer to the procedure report and other information in the discharge instructions given to you for any specific questions about what was found during the examination. If this information does not answer your questions, please call [facility] office at [phone] to clarify.   YOU SHOULD EXPECT: Some feelings of bloating in the abdomen. Passage of more gas than usual. Walking can help get rid of the air that was put into your GI tract during the procedure and reduce the bloating. If you had a lower endoscopy (such as a colonoscopy or flexible sigmoidoscopy) you may notice spotting of blood in your stool or on the toilet paper. Some abdominal soreness may be present for a day or two, also.  DIET: Your first meal following the procedure should be a light meal and then it is ok to progress to your normal diet. A half-sandwich or bowl of soup is an example of a good first meal. Heavy or fried foods are harder to digest and may make you feel nauseous or bloated. Drink plenty of fluids but you should avoid alcoholic beverages for 24 hours. If you had a esophageal dilation, please see attached instructions for diet.   ACTIVITY: Your care partner should take you home directly after the procedure. You should plan to take it easy, moving slowly for the rest of the day. You can resume normal activity the day after the procedure however YOU SHOULD NOT DRIVE, use power tools, machinery or perform tasks that involve climbing or major physical exertion for 24 hours (because of the sedation medicines used during the test).   SYMPTOMS TO REPORT IMMEDIATELY: A gastroenterologist can be reached at any hour. Please call [phone]  for any of the following symptoms:  Following lower endoscopy (colonoscopy, flexible sigmoidoscopy) Excessive amounts of blood in the stool  Significant tenderness, worsening of abdominal pains  Swelling of the abdomen that is new, acute  Fever of 100 or  higher  Following upper endoscopy (EGD, EUS, ERCP, esophageal dilation) Vomiting of blood or coffee ground material  New, significant abdominal pain  New, significant chest pain or pain under the shoulder blades  Painful or persistently difficult swallowing  New shortness of breath  Black, tarry-looking or red, bloody stools  FOLLOW UP:  If any biopsies were taken you will be contacted by phone or by letter within the next 1-3 weeks. Call [phone]  if you have not heard about the biopsies in 3 weeks.  Please also call with any specific questions about appointments or follow up tests.  Call if question or problem otherwise will arrange lab follow-up later this week and go ahead and do the cards and then we'll start iron when done and will start beta blockers in office when we rechecked vital signs for your lab appointment and probable follow-up in 3 weeks afterward

## 2016-08-06 NOTE — Op Note (Signed)
[facility] Patient Name: Courtney Braun Procedure Date : 08/06/2016 MRN: [id number] Attending MD: Clarene Essex , MD Date of Birth: 09-Feb-1951 CSN: [id number] Age: 66 Admit Type: Outpatient Procedure:                Upper GI endoscopy Indications:              Iron deficiency anemia abnormal CT worrisome for                            his cirrhosis and varices Providers:                Clarene Essex, MD, Cleda Daub, RN, Cletis Athens,                            Technician, Alan Mulder, Technician, Phill Myron.                            Proofreader, CRNA Referring MD:              Medicines:                Propofol total dose 150 mg IV,40 mg lidocaine Complications:            No immediate complications. Estimated Blood Loss:     Estimated blood loss: none. Procedure:                Pre-Anesthesia Assessment:                           - Prior to the procedure, a History and Physical                            was performed, and patient medications and                            allergies were reviewed. The patient's tolerance of                            previous anesthesia was also reviewed. The risks                            and benefits of the procedure and the sedation                            options and risks were discussed with the patient.                            All questions were answered, and informed consent                            was obtained. Prior Anticoagulants: The patient has                            taken aspirin, last dose was 7 days prior to  procedure. ASA Grade Assessment: II - A patient                            with mild systemic disease. After reviewing the                            risks and benefits, the patient was deemed in                            satisfactory condition to undergo the procedure.                           After obtaining informed consent, the endoscope was   passed under direct vision. Throughout the                            procedure, the patient's blood pressure, pulse, and                            oxygen saturations were monitored continuously. The                            EG-[id number] ([id number]) scope was introduced through the                            mouth, and advanced to the third part of duodenum.                            The upper GI endoscopy was accomplished without                            difficulty. The patient tolerated the procedure                            well. Scope In: Scope Out: Findings:      The larynx was normal.      A small hiatal hernia was present.      Grade II varices were found in the lower third of the esophagus.      A few diminutive semi-sessile polyps with no bleeding and no stigmata of       recent bleeding were found in the gastric antrum.      The duodenal bulb, first portion of the duodenum, second portion of the       duodenum and third portion of the duodenum were normal.      The exam was otherwise without abnormality.      Mild portal hypertensive gastropathy was found in the cardia, in the       gastric fundus, on the proximal greater curvature of the stomach and on       the proximal lesser curvature of the stomach. Impression:               - Normal larynx.                           - Small hiatal hernia.                           -  Grade I esophageal varices.                           - A few gastric polyps.                           - Normal duodenal bulb, first portion of the                            duodenum, second portion of the duodenum and third                            portion of the duodenum.                           - The examination was otherwise normal.                           - Portal hypertensive gastropathy.                           - No specimens collected. Moderate Sedation:      moderate sedation-none Recommendation:           - Patient has a contact  number available for                            emergencies. The signs and symptoms of potential                            delayed complications were discussed with the                            patient. Return to normal activities tomorrow.                            Written discharge instructions were provided to the                            patient.                           - Soft diet today. continue to hold aspirin                           - Continue present medications. we will probably                            start beta blockers later this week based on vital                            signs in office on lab visit                           - Return to GI clinic at appointment to be  scheduled.                           - Telephone GI clinic if symptomatic PRN. proceed                            with guaiac cards and then will begin iron                           - Check hemogram with white blood cell count and                            platelets this week as well as entire cirrhosis                            liver panel. Procedure Code(s):        --- Professional ---                           [phone], Esophagogastroduodenoscopy, flexible,                            transoral; diagnostic, including collection of                            specimen(s) by brushing or washing, when performed                            (separate procedure) Diagnosis Code(s):        --- Professional ---                           K44.9, Diaphragmatic hernia without obstruction or                            gangrene                           I85.00, Esophageal varices without bleeding                           K31.7, Polyp of stomach and duodenum                           D50.9, Iron deficiency anemia, unspecified CPT copyright 2016 American Medical Association. All rights reserved. The codes documented in this report are preliminary and upon coder review may  be  revised to meet current compliance requirements. Clarene Essex, MD 08/06/2016 11:56:16 AM This report has been signed electronically. Number of Addenda: 0

## 2016-08-08 DIAGNOSIS — K7469 Other cirrhosis of liver: Secondary | ICD-10-CM | POA: Diagnosis not present

## 2016-08-08 DIAGNOSIS — R748 Abnormal levels of other serum enzymes: Secondary | ICD-10-CM | POA: Diagnosis not present

## 2016-08-09 DIAGNOSIS — D500 Iron deficiency anemia secondary to blood loss (chronic): Secondary | ICD-10-CM | POA: Diagnosis not present

## 2016-10-04 ENCOUNTER — Other Ambulatory Visit: Payer: Self-pay | Admitting: Family Medicine

## 2016-10-04 DIAGNOSIS — E782 Mixed hyperlipidemia: Secondary | ICD-10-CM | POA: Diagnosis not present

## 2016-10-04 DIAGNOSIS — K7469 Other cirrhosis of liver: Secondary | ICD-10-CM | POA: Diagnosis not present

## 2016-10-04 DIAGNOSIS — Z7984 Long term (current) use of oral hypoglycemic drugs: Secondary | ICD-10-CM | POA: Diagnosis not present

## 2016-10-04 DIAGNOSIS — Z0000 Encounter for general adult medical examination without abnormal findings: Secondary | ICD-10-CM | POA: Diagnosis not present

## 2016-10-04 DIAGNOSIS — E1165 Type 2 diabetes mellitus with hyperglycemia: Secondary | ICD-10-CM | POA: Diagnosis not present

## 2016-10-04 DIAGNOSIS — Z86010 Personal history of colonic polyps: Secondary | ICD-10-CM | POA: Diagnosis not present

## 2016-10-04 DIAGNOSIS — D649 Anemia, unspecified: Secondary | ICD-10-CM | POA: Diagnosis not present

## 2016-10-04 DIAGNOSIS — M8580 Other specified disorders of bone density and structure, unspecified site: Secondary | ICD-10-CM | POA: Diagnosis not present

## 2016-10-04 DIAGNOSIS — R748 Abnormal levels of other serum enzymes: Secondary | ICD-10-CM | POA: Diagnosis not present

## 2016-10-04 DIAGNOSIS — Z1231 Encounter for screening mammogram for malignant neoplasm of breast: Secondary | ICD-10-CM

## 2016-10-04 DIAGNOSIS — I10 Essential (primary) hypertension: Secondary | ICD-10-CM | POA: Diagnosis not present

## 2016-10-19 ENCOUNTER — Ambulatory Visit
Admission: RE | Admit: 2016-10-19 | Discharge: 2016-10-19 | Disposition: A | Discharge status: Home / Self Care | Payer: Medicare HMO | Source: Ambulatory Visit | Attending: Family Medicine | Admitting: Family Medicine

## 2016-10-19 DIAGNOSIS — Z1231 Encounter for screening mammogram for malignant neoplasm of breast: Secondary | ICD-10-CM | POA: Diagnosis not present

## 2017-04-10 DIAGNOSIS — I10 Essential (primary) hypertension: Secondary | ICD-10-CM | POA: Diagnosis not present

## 2017-04-10 DIAGNOSIS — E782 Mixed hyperlipidemia: Secondary | ICD-10-CM | POA: Diagnosis not present

## 2017-04-10 DIAGNOSIS — Z6837 Body mass index (BMI) 37.0-37.9, adult: Secondary | ICD-10-CM | POA: Diagnosis not present

## 2017-04-10 DIAGNOSIS — K7460 Unspecified cirrhosis of liver: Secondary | ICD-10-CM | POA: Diagnosis not present

## 2017-04-10 DIAGNOSIS — Z7984 Long term (current) use of oral hypoglycemic drugs: Secondary | ICD-10-CM | POA: Diagnosis not present

## 2017-04-10 DIAGNOSIS — I8500 Esophageal varices without bleeding: Secondary | ICD-10-CM | POA: Diagnosis not present

## 2017-04-10 DIAGNOSIS — E119 Type 2 diabetes mellitus without complications: Secondary | ICD-10-CM | POA: Diagnosis not present

## 2017-04-10 DIAGNOSIS — D649 Anemia, unspecified: Secondary | ICD-10-CM | POA: Diagnosis not present

## 2017-05-07 DIAGNOSIS — E119 Type 2 diabetes mellitus without complications: Secondary | ICD-10-CM | POA: Diagnosis not present

## 2017-05-07 DIAGNOSIS — H524 Presbyopia: Secondary | ICD-10-CM | POA: Diagnosis not present

## 2017-05-29 DIAGNOSIS — I8500 Esophageal varices without bleeding: Secondary | ICD-10-CM | POA: Diagnosis not present

## 2017-05-29 DIAGNOSIS — K7581 Nonalcoholic steatohepatitis (NASH): Secondary | ICD-10-CM | POA: Diagnosis not present

## 2017-05-30 ENCOUNTER — Other Ambulatory Visit: Payer: Self-pay | Admitting: Nurse Practitioner

## 2017-05-30 DIAGNOSIS — K7581 Nonalcoholic steatohepatitis (NASH): Secondary | ICD-10-CM

## 2017-06-10 ENCOUNTER — Ambulatory Visit
Admission: RE | Admit: 2017-06-10 | Discharge: 2017-06-10 | Disposition: A | Discharge status: Home / Self Care | Payer: Medicare HMO | Source: Ambulatory Visit | Attending: Nurse Practitioner | Admitting: Nurse Practitioner

## 2017-06-10 DIAGNOSIS — K7581 Nonalcoholic steatohepatitis (NASH): Secondary | ICD-10-CM

## 2017-06-10 DIAGNOSIS — K8020 Calculus of gallbladder without cholecystitis without obstruction: Secondary | ICD-10-CM | POA: Diagnosis not present

## 2017-06-10 DIAGNOSIS — K7460 Unspecified cirrhosis of liver: Secondary | ICD-10-CM | POA: Diagnosis not present

## 2017-11-04 DIAGNOSIS — D649 Anemia, unspecified: Secondary | ICD-10-CM | POA: Diagnosis not present

## 2017-11-04 DIAGNOSIS — E782 Mixed hyperlipidemia: Secondary | ICD-10-CM | POA: Diagnosis not present

## 2017-11-04 DIAGNOSIS — E1169 Type 2 diabetes mellitus with other specified complication: Secondary | ICD-10-CM | POA: Diagnosis not present

## 2017-11-04 DIAGNOSIS — I10 Essential (primary) hypertension: Secondary | ICD-10-CM | POA: Diagnosis not present

## 2017-11-04 DIAGNOSIS — K7460 Unspecified cirrhosis of liver: Secondary | ICD-10-CM | POA: Diagnosis not present

## 2017-11-04 DIAGNOSIS — Z86010 Personal history of colonic polyps: Secondary | ICD-10-CM | POA: Diagnosis not present

## 2017-11-04 DIAGNOSIS — Z0000 Encounter for general adult medical examination without abnormal findings: Secondary | ICD-10-CM | POA: Diagnosis not present

## 2017-11-04 DIAGNOSIS — K7581 Nonalcoholic steatohepatitis (NASH): Secondary | ICD-10-CM | POA: Diagnosis not present

## 2017-11-11 ENCOUNTER — Other Ambulatory Visit: Payer: Self-pay | Admitting: Family Medicine

## 2017-11-11 DIAGNOSIS — Z1231 Encounter for screening mammogram for malignant neoplasm of breast: Secondary | ICD-10-CM

## 2017-11-28 DIAGNOSIS — K7290 Hepatic failure, unspecified without coma: Secondary | ICD-10-CM | POA: Diagnosis not present

## 2017-11-28 DIAGNOSIS — K7581 Nonalcoholic steatohepatitis (NASH): Secondary | ICD-10-CM | POA: Diagnosis not present

## 2017-12-02 ENCOUNTER — Other Ambulatory Visit: Payer: Self-pay | Admitting: Nurse Practitioner

## 2017-12-02 DIAGNOSIS — K7581 Nonalcoholic steatohepatitis (NASH): Secondary | ICD-10-CM

## 2017-12-04 ENCOUNTER — Ambulatory Visit
Admission: RE | Admit: 2017-12-04 | Discharge: 2017-12-04 | Disposition: A | Discharge status: Home / Self Care | Payer: Medicare HMO | Source: Ambulatory Visit | Attending: Nurse Practitioner | Admitting: Nurse Practitioner

## 2017-12-04 DIAGNOSIS — K8020 Calculus of gallbladder without cholecystitis without obstruction: Secondary | ICD-10-CM | POA: Diagnosis not present

## 2017-12-04 DIAGNOSIS — K7581 Nonalcoholic steatohepatitis (NASH): Secondary | ICD-10-CM

## 2017-12-10 ENCOUNTER — Ambulatory Visit
Admission: RE | Admit: 2017-12-10 | Discharge: 2017-12-10 | Disposition: A | Discharge status: Home / Self Care | Payer: Medicare HMO | Source: Ambulatory Visit | Attending: Family Medicine | Admitting: Family Medicine

## 2017-12-10 DIAGNOSIS — Z1231 Encounter for screening mammogram for malignant neoplasm of breast: Secondary | ICD-10-CM

## 2017-12-11 ENCOUNTER — Other Ambulatory Visit: Payer: Self-pay | Admitting: Family Medicine

## 2017-12-11 DIAGNOSIS — R928 Other abnormal and inconclusive findings on diagnostic imaging of breast: Secondary | ICD-10-CM

## 2017-12-13 ENCOUNTER — Ambulatory Visit: Payer: Medicare HMO

## 2017-12-13 ENCOUNTER — Ambulatory Visit
Admission: RE | Admit: 2017-12-13 | Discharge: 2017-12-13 | Disposition: A | Discharge status: Home / Self Care | Payer: Medicare HMO | Source: Ambulatory Visit | Attending: Family Medicine | Admitting: Family Medicine

## 2017-12-13 DIAGNOSIS — R928 Other abnormal and inconclusive findings on diagnostic imaging of breast: Secondary | ICD-10-CM | POA: Diagnosis not present

## 2018-05-08 ENCOUNTER — Encounter (HOSPITAL_COMMUNITY): Payer: Self-pay | Admitting: *Deleted

## 2018-05-08 ENCOUNTER — Other Ambulatory Visit: Payer: Self-pay | Admitting: Gastroenterology

## 2018-05-08 DIAGNOSIS — K7469 Other cirrhosis of liver: Secondary | ICD-10-CM | POA: Diagnosis not present

## 2018-05-08 DIAGNOSIS — K920 Hematemesis: Secondary | ICD-10-CM | POA: Diagnosis not present

## 2018-05-09 ENCOUNTER — Other Ambulatory Visit: Payer: Self-pay

## 2018-05-09 ENCOUNTER — Encounter (HOSPITAL_COMMUNITY): Payer: Self-pay

## 2018-05-09 ENCOUNTER — Ambulatory Visit (HOSPITAL_COMMUNITY)
Admission: RE | Admit: 2018-05-09 | Discharge: 2018-05-09 | Disposition: A | Discharge status: Home / Self Care | Payer: Medicare HMO | Attending: Gastroenterology | Admitting: Gastroenterology

## 2018-05-09 ENCOUNTER — Ambulatory Visit (HOSPITAL_COMMUNITY): Payer: Medicare HMO | Admitting: Anesthesiology

## 2018-05-09 ENCOUNTER — Encounter (HOSPITAL_COMMUNITY)
Admission: RE | Disposition: A | Discharge status: Home / Self Care | Payer: Self-pay | Source: Home / Self Care | Attending: Gastroenterology

## 2018-05-09 DIAGNOSIS — Z7984 Long term (current) use of oral hypoglycemic drugs: Secondary | ICD-10-CM | POA: Diagnosis not present

## 2018-05-09 DIAGNOSIS — I8500 Esophageal varices without bleeding: Secondary | ICD-10-CM | POA: Diagnosis not present

## 2018-05-09 DIAGNOSIS — K920 Hematemesis: Secondary | ICD-10-CM | POA: Insufficient documentation

## 2018-05-09 DIAGNOSIS — K259 Gastric ulcer, unspecified as acute or chronic, without hemorrhage or perforation: Secondary | ICD-10-CM | POA: Insufficient documentation

## 2018-05-09 DIAGNOSIS — Z79899 Other long term (current) drug therapy: Secondary | ICD-10-CM | POA: Diagnosis not present

## 2018-05-09 DIAGNOSIS — K766 Portal hypertension: Secondary | ICD-10-CM | POA: Diagnosis not present

## 2018-05-09 DIAGNOSIS — Z7982 Long term (current) use of aspirin: Secondary | ICD-10-CM | POA: Insufficient documentation

## 2018-05-09 DIAGNOSIS — I10 Essential (primary) hypertension: Secondary | ICD-10-CM | POA: Insufficient documentation

## 2018-05-09 DIAGNOSIS — E119 Type 2 diabetes mellitus without complications: Secondary | ICD-10-CM | POA: Insufficient documentation

## 2018-05-09 DIAGNOSIS — K3189 Other diseases of stomach and duodenum: Secondary | ICD-10-CM | POA: Insufficient documentation

## 2018-05-09 DIAGNOSIS — I8510 Secondary esophageal varices without bleeding: Secondary | ICD-10-CM | POA: Diagnosis not present

## 2018-05-09 DIAGNOSIS — K7469 Other cirrhosis of liver: Secondary | ICD-10-CM | POA: Insufficient documentation

## 2018-05-09 DIAGNOSIS — K2210 Ulcer of esophagus without bleeding: Secondary | ICD-10-CM | POA: Diagnosis not present

## 2018-05-09 HISTORY — PX: GASTRIC VARICES BANDING: SHX5519

## 2018-05-09 HISTORY — PX: ESOPHAGOGASTRODUODENOSCOPY (EGD) WITH PROPOFOL: SHX5813

## 2018-05-09 LAB — GLUCOSE, CAPILLARY: Glucose-Capillary: 96 mg/dL (ref 70–99)

## 2018-05-09 SURGERY — ESOPHAGOGASTRODUODENOSCOPY (EGD) WITH PROPOFOL
Anesthesia: Monitor Anesthesia Care

## 2018-05-09 MED ORDER — PROPOFOL 10 MG/ML IV BOLUS
INTRAVENOUS | Status: AC
  Filled 2018-05-09: qty 40

## 2018-05-09 MED ORDER — LACTATED RINGERS IV SOLN
INTRAVENOUS | Status: DC
  Administered 2018-05-09: 11:00:00 via INTRAVENOUS

## 2018-05-09 MED ORDER — PROPOFOL 10 MG/ML IV BOLUS
INTRAVENOUS | Status: DC | PRN
  Administered 2018-05-09 (×4): 20 mg via INTRAVENOUS

## 2018-05-09 MED ORDER — SODIUM CHLORIDE 0.9 % IV SOLN: INTRAVENOUS | Status: DC

## 2018-05-09 MED ORDER — ONDANSETRON HCL 4 MG/2ML IJ SOLN
INTRAMUSCULAR | Status: DC | PRN
  Administered 2018-05-09: 4 mg via INTRAVENOUS

## 2018-05-09 MED ORDER — PROPOFOL 500 MG/50ML IV EMUL
INTRAVENOUS | Status: DC | PRN
  Administered 2018-05-09: 120 ug/kg/min via INTRAVENOUS

## 2018-05-09 MED ORDER — LIDOCAINE 2% (20 MG/ML) 5 ML SYRINGE
INTRAMUSCULAR | Status: DC | PRN
  Administered 2018-05-09: 100 mg via INTRAVENOUS

## 2018-05-09 SURGICAL SUPPLY — 15 items

## 2018-05-09 NOTE — Transfer of Care (Signed)
Immediate Anesthesia Transfer of Care Note  Patient: Courtney Braun  Procedure(s) Performed: ESOPHAGOGASTRODUODENOSCOPY (EGD) WITH PROPOFOL (N/A ) GASTRIC VARICES BANDING (N/A )  Patient Location: Endoscopy Unit  Anesthesia Type:MAC  Level of Consciousness: awake  Airway & Oxygen Therapy: Patient Spontanous Breathing and Patient connected to nasal cannula oxygen  Post-op Assessment: Report given to RN and Post -op Vital signs reviewed and stable  Post vital signs: Reviewed and stable  Last Vitals:  Vitals Value Taken Time  BP    Temp    Pulse    Resp    SpO2      Last Pain:  Vitals:   05/09/18 1048  TempSrc: Oral  PainSc: 0-No pain         Complications: No apparent anesthesia complications

## 2018-05-09 NOTE — Progress Notes (Signed)
[name] 11:32 AM  Subjective: Patient without any new problems and no further bleeding since we saw her 2 days ago in the office and her labs were discussed  Objective: Vital signs stable afebrile exam please see preassessment evaluation  Assessment: Self-limited upper GI bleeding in a patient with cirrhosis on aspirin  Plan: Okay to proceed with endoscopy and possible banding with anesthesia assistance and possibly restarting her Inderal which we discussed  [facility]  Pager [phone] After 5PM or if no answer call [phone]

## 2018-05-09 NOTE — Anesthesia Postprocedure Evaluation (Signed)
Anesthesia Post Note  Patient: Courtney Braun  Procedure(s) Performed: ESOPHAGOGASTRODUODENOSCOPY (EGD) WITH PROPOFOL (N/A ) GASTRIC VARICES BANDING (N/A )     Patient location during evaluation: PACU Anesthesia Type: MAC Level of consciousness: awake Pain management: pain level controlled Vital Signs Assessment: post-procedure vital signs reviewed and stable Respiratory status: spontaneous breathing Cardiovascular status: stable Postop Assessment: no apparent nausea or vomiting Anesthetic complications: no    Last Vitals:  Vitals:   05/09/18 1247 05/09/18 1250  BP: (!) 125/56 (!) 117/50  Pulse: (!) 106 (!) 104  Resp: (!) 21 18  Temp: (!) 36.4 C   SpO2: 100% 100%    Last Pain:  Vitals:   05/09/18 1250  TempSrc:   PainSc: 0-No pain                 Manly Nestle

## 2018-05-09 NOTE — Discharge Instructions (Signed)
Liquids only for 4 hours and if doing well may have soft solids today and for 1 week chew your food well and nothing too hard drink plenty of liquids and call if question or problem particularly if signs of rebleeding and resume your propranolol at previous dose nurse will call in a prescription and otherwise follow-up in 1 month  YOU HAD AN ENDOSCOPIC PROCEDURE TODAY: Refer to the procedure report and other information in the discharge instructions given to you for any specific questions about what was found during the examination. If this information does not answer your questions, please call [facility] office at [phone] to clarify.   YOU SHOULD EXPECT: Some feelings of bloating in the abdomen. Passage of more gas than usual. Walking can help get rid of the air that was put into your GI tract during the procedure and reduce the bloating. If you had a lower endoscopy (such as a colonoscopy or flexible sigmoidoscopy) you may notice spotting of blood in your stool or on the toilet paper. Some abdominal soreness may be present for a day or two, also.  DIET: Your first meal following the procedure should be a light meal and then it is ok to progress to your normal diet. A half-sandwich or bowl of soup is an example of a good first meal. Heavy or fried foods are harder to digest and may make you feel nauseous or bloated. Drink plenty of fluids but you should avoid alcoholic beverages for 24 hours. If you had a esophageal dilation, please see attached instructions for diet.   ACTIVITY: Your care partner should take you home directly after the procedure. You should plan to take it easy, moving slowly for the rest of the day. You can resume normal activity the day after the procedure however YOU SHOULD NOT DRIVE, use power tools, machinery or perform tasks that involve climbing or major physical exertion for 24 hours (because of the sedation medicines used during the test).   SYMPTOMS TO REPORT  IMMEDIATELY: A gastroenterologist can be reached at any hour. Please call [phone]  for any of the following symptoms:   Following lower endoscopy (colonoscopy, flexible sigmoidoscopy) Excessive amounts of blood in the stool  Significant tenderness, worsening of abdominal pains  Swelling of the abdomen that is new, acute  Fever of 100 or higher   Following upper endoscopy (EGD, EUS, ERCP, esophageal dilation) Vomiting of blood or coffee ground material  New, significant abdominal pain  New, significant chest pain or pain under the shoulder blades  Painful or persistently difficult swallowing  New shortness of breath  Black, tarry-looking or red, bloody stools  FOLLOW UP:  If any biopsies were taken you will be contacted by phone or by letter within the next 1-3 weeks. Call [phone]  if you have not heard about the biopsies in 3 weeks.  Please also call with any specific questions about appointments or follow up tests.

## 2018-05-09 NOTE — Addendum Note (Signed)
Addendum  created 05/09/18 1313 by Belinda Block, MD   Attestation recorded in [place], [place] filed

## 2018-05-09 NOTE — Op Note (Signed)
[facility] Patient Name: Courtney Braun Procedure Date: 05/09/2018 MRN: [id number] Attending MD: Clarene Essex , MD Date of Birth: [birth year] CSN: [id number] Age: 68 Admit Type: Outpatient Procedure:                Upper GI endoscopy Indications:              Hematemesis, and patient on baby aspirin and with                            cirrhosis and history of varices Providers:                Clarene Essex, MD, Jeanella Cara, RN, Charolette Child, Technician, Danley Danker, CRNA Referring MD:              Medicines:                Propofol total dose 240 mg IV, 100 mg IV lidocaine Complications:            No immediate complications. Estimated Blood Loss:     Estimated blood loss: none. Procedure:                Pre-Anesthesia Assessment:                           - Prior to the procedure, a History and Physical                            was performed, and patient medications and                            allergies were reviewed. The patient's tolerance of                            previous anesthesia was also reviewed. The risks                            and benefits of the procedure and the sedation                            options and risks were discussed with the patient.                            All questions were answered, and informed consent                            was obtained. Prior Anticoagulants: The patient has                            taken aspirin, last dose was 1 day prior to                            procedure. ASA Grade Assessment: II - A patient  with mild systemic disease. After reviewing the                            risks and benefits, the patient was deemed in                            satisfactory condition to undergo the procedure.                           After obtaining informed consent, the endoscope was                            passed under direct vision. Throughout the                             procedure, the patient's blood pressure, pulse, and                            oxygen saturations were monitored continuously. The                            [id number]-[id number] ([id number]) Olympus gastroscope was                            introduced through the mouth, and advanced to the                            third part of duodenum. The upper GI endoscopy was                            accomplished without difficulty. The patient                            tolerated the procedure well. Scope In: Scope Out: Findings:      The larynx was normal.      Grade II varices were found in the middle third of the esophagus and in       the lower third of the esophagus. They were small in size. tHey did have       a few red spots on them so Four bands were successfully placed with Out       bleeding. There was no bleeding during the procedure.      Mild portal hypertensive gastropathy was found in the cardia and in the       gastric fundus and proximal stomach.      A few localized, small non-bleeding erosions were found in the gastric       antrum. Probably aspirin induced      The duodenal bulb, first portion of the duodenum, second portion of the       duodenum and third portion of the duodenum were normal.      The exam was otherwise without abnormality. Impression:               - Normal larynx.                           -  Grade II esophageal varices. Banded.                           - Portal hypertensive gastropathy. Mild                           - Non-bleeding erosive gastropathy.                           - Normal duodenal bulb, first portion of the                            duodenum, second portion of the duodenum and third                            portion of the duodenum.                           - The examination was otherwise normal.                           - No specimens collected. Moderate Sedation:      Not Applicable - Patient had care per  Anesthesia. Recommendation:           - Patient has a contact number available for                            emergencies. The signs and symptoms of potential                            delayed complications were discussed with the                            patient. Return to normal activities tomorrow.                            Written discharge instructions were provided to the                            patient.                           - Soft diet today.                           - No aspirin, ibuprofen, naproxen, or other                            non-steroidal anti-inflammatory drugs Long-term.                           - Repeat upper endoscopy in 1 month to evaluate the                            response to therapy.                           -  Return to GI clinic in 4 weeks.                           - Telephone GI clinic if symptomatic PRN. Resume                            propranolol at previous dose Procedure Code(s):        --- Professional ---                           [id number], Esophagogastroduodenoscopy, flexible,                            transoral; with band ligation of esophageal/gastric                            varices Diagnosis Code(s):        --- Professional ---                           I85.00, Esophageal varices without bleeding                           K76.6, Portal hypertension                           K31.89, Other diseases of stomach and duodenum                           K92.0, Hematemesis CPT copyright 2018 American Medical Association. All rights reserved. The codes documented in this report are preliminary and upon coder review may  be revised to meet current compliance requirements. Clarene Essex, MD 05/09/2018 12:54:39 PM This report has been signed electronically. Number of Addenda: 0

## 2018-05-09 NOTE — Anesthesia Preprocedure Evaluation (Addendum)
Anesthesia Evaluation  Patient identified by MRN, date of birth, ID band Patient awake    Reviewed: Allergy & Precautions, NPO status   Airway Mallampati: II  TM Distance: >3 FB     Dental   Pulmonary shortness of breath,    breath sounds clear to auscultation       Cardiovascular hypertension,  Rhythm:Regular Rate:Normal     Neuro/Psych    GI/Hepatic Neg liver ROS, History noted. CG   Endo/Other  diabetes  Renal/GU negative Renal ROS     Musculoskeletal   Abdominal   Peds  Hematology   Anesthesia Other Findings   Reproductive/Obstetrics                             Anesthesia Physical Anesthesia Plan  ASA: III  Anesthesia Plan: MAC   Post-op Pain Management:    Induction: Intravenous  PONV Risk Score and Plan: 2 and Ondansetron, Dexamethasone, Propofol infusion and Treatment may vary due to age or medical condition  Airway Management Planned: Nasal Cannula and Simple Face Mask  Additional Equipment:   Intra-op Plan:   Post-operative Plan:   Informed Consent: I have reviewed the patients History and Physical, chart, labs and discussed the procedure including the risks, benefits and alternatives for the proposed anesthesia with the patient or authorized representative who has indicated his/her understanding and acceptance.     Dental advisory given  Plan Discussed with: CRNA and Anesthesiologist  Anesthesia Plan Comments:         Anesthesia Quick Evaluation

## 2018-05-09 NOTE — Anesthesia Postprocedure Evaluation (Signed)
Anesthesia Post Note  Patient: Courtney Braun  Procedure(s) Performed: ESOPHAGOGASTRODUODENOSCOPY (EGD) WITH PROPOFOL (N/A ) GASTRIC VARICES BANDING (N/A )     Patient location during evaluation: Endoscopy Anesthesia Type: MAC Level of consciousness: awake Pain management: pain level controlled Vital Signs Assessment: post-procedure vital signs reviewed and stable Respiratory status: spontaneous breathing Cardiovascular status: stable Postop Assessment: no apparent nausea or vomiting Anesthetic complications: no    Last Vitals:  Vitals:   05/09/18 1250 05/09/18 1300  BP: (!) 117/50 (!) 126/57  Pulse: (!) 104 97  Resp: 18 (!) 21  Temp:    SpO2: 100% 96%    Last Pain:  Vitals:   05/09/18 1300  TempSrc:   PainSc: 0-No pain                 Louie Flenner

## 2018-05-09 NOTE — Anesthesia Procedure Notes (Signed)
Date/Time: 05/09/2018 12:25 PM Performed by: Sharlette Dense, CRNA Oxygen Delivery Method: Nasal cannula

## 2018-05-12 ENCOUNTER — Encounter (HOSPITAL_COMMUNITY): Payer: Self-pay | Admitting: Gastroenterology

## 2018-05-29 DIAGNOSIS — Z7984 Long term (current) use of oral hypoglycemic drugs: Secondary | ICD-10-CM | POA: Diagnosis not present

## 2018-05-29 DIAGNOSIS — R197 Diarrhea, unspecified: Secondary | ICD-10-CM | POA: Diagnosis not present

## 2018-05-29 DIAGNOSIS — E1169 Type 2 diabetes mellitus with other specified complication: Secondary | ICD-10-CM | POA: Diagnosis not present

## 2018-05-29 DIAGNOSIS — R110 Nausea: Secondary | ICD-10-CM | POA: Diagnosis not present

## 2018-05-29 DIAGNOSIS — E782 Mixed hyperlipidemia: Secondary | ICD-10-CM | POA: Diagnosis not present

## 2018-05-29 DIAGNOSIS — I10 Essential (primary) hypertension: Secondary | ICD-10-CM | POA: Diagnosis not present

## 2018-05-29 DIAGNOSIS — K7460 Unspecified cirrhosis of liver: Secondary | ICD-10-CM | POA: Diagnosis not present

## 2018-05-29 DIAGNOSIS — Z6837 Body mass index (BMI) 37.0-37.9, adult: Secondary | ICD-10-CM | POA: Diagnosis not present

## 2018-05-29 DIAGNOSIS — I8500 Esophageal varices without bleeding: Secondary | ICD-10-CM | POA: Diagnosis not present

## 2018-06-03 DIAGNOSIS — H52221 Regular astigmatism, right eye: Secondary | ICD-10-CM | POA: Diagnosis not present

## 2018-06-03 DIAGNOSIS — H5203 Hypermetropia, bilateral: Secondary | ICD-10-CM | POA: Diagnosis not present

## 2018-06-03 DIAGNOSIS — I10 Essential (primary) hypertension: Secondary | ICD-10-CM | POA: Diagnosis not present

## 2018-06-03 DIAGNOSIS — H524 Presbyopia: Secondary | ICD-10-CM | POA: Diagnosis not present

## 2018-06-03 DIAGNOSIS — E119 Type 2 diabetes mellitus without complications: Secondary | ICD-10-CM | POA: Diagnosis not present

## 2018-07-17 DIAGNOSIS — Z86010 Personal history of colonic polyps: Secondary | ICD-10-CM | POA: Diagnosis not present

## 2018-07-17 DIAGNOSIS — I8500 Esophageal varices without bleeding: Secondary | ICD-10-CM | POA: Diagnosis not present

## 2018-07-17 DIAGNOSIS — K7469 Other cirrhosis of liver: Secondary | ICD-10-CM | POA: Diagnosis not present

## 2018-09-16 DIAGNOSIS — I8500 Esophageal varices without bleeding: Secondary | ICD-10-CM | POA: Diagnosis not present

## 2018-09-16 DIAGNOSIS — K7460 Unspecified cirrhosis of liver: Secondary | ICD-10-CM | POA: Diagnosis not present

## 2018-09-16 DIAGNOSIS — D500 Iron deficiency anemia secondary to blood loss (chronic): Secondary | ICD-10-CM | POA: Diagnosis not present

## 2018-09-25 ENCOUNTER — Other Ambulatory Visit: Payer: Self-pay | Admitting: Gastroenterology

## 2018-10-31 ENCOUNTER — Other Ambulatory Visit (HOSPITAL_COMMUNITY)
Admission: RE | Admit: 2018-10-31 | Discharge: 2018-10-31 | Disposition: A | Discharge status: Home / Self Care | Payer: Medicare HMO | Source: Ambulatory Visit | Attending: Gastroenterology | Admitting: Gastroenterology

## 2018-10-31 DIAGNOSIS — Z01812 Encounter for preprocedural laboratory examination: Secondary | ICD-10-CM | POA: Insufficient documentation

## 2018-10-31 DIAGNOSIS — Z20828 Contact with and (suspected) exposure to other viral communicable diseases: Secondary | ICD-10-CM | POA: Insufficient documentation

## 2018-10-31 LAB — SARS CORONAVIRUS 2 (TAT 6-24 HRS): SARS Coronavirus 2: NEGATIVE

## 2018-11-03 ENCOUNTER — Other Ambulatory Visit: Payer: Self-pay

## 2018-11-03 ENCOUNTER — Other Ambulatory Visit: Payer: Self-pay | Admitting: Gastroenterology

## 2018-11-03 ENCOUNTER — Encounter (HOSPITAL_COMMUNITY): Payer: Self-pay | Admitting: *Deleted

## 2018-11-03 NOTE — Anesthesia Preprocedure Evaluation (Addendum)
Anesthesia Evaluation  Patient identified by MRN, date of birth, ID band Patient awake    Reviewed: Allergy & Precautions, NPO status , Patient's Chart, lab work & pertinent test results  History of Anesthesia Complications Negative for: history of anesthetic complications  Airway Mallampati: II  TM Distance: >3 FB Neck ROM: Full    Dental  (+) Dental Advisory Given, Teeth Intact   Pulmonary neg pulmonary ROS,    Pulmonary exam normal        Cardiovascular hypertension, Pt. on medications and Pt. on home beta blockers (-) anginaNormal cardiovascular exam     Neuro/Psych negative neurological ROS  negative psych ROS   GI/Hepatic negative GI ROS, (+)   Esophageal Varices    ,   Endo/Other  diabetes, Well Controlled, Type 2 Obesity   Renal/GU negative Renal ROS     Musculoskeletal negative musculoskeletal ROS (+)   Abdominal   Peds  Hematology negative hematology ROS (+)   Anesthesia Other Findings   Reproductive/Obstetrics                            Anesthesia Physical Anesthesia Plan  ASA: III  Anesthesia Plan: MAC   Post-op Pain Management:    Induction: Intravenous  PONV Risk Score and Plan: 2 and Propofol infusion and Treatment may vary due to age or medical condition  Airway Management Planned: Nasal Cannula and Natural Airway  Additional Equipment: None  Intra-op Plan:   Post-operative Plan:   Informed Consent: I have reviewed the patients History and Physical, chart, labs and discussed the procedure including the risks, benefits and alternatives for the proposed anesthesia with the patient or authorized representative who has indicated his/her understanding and acceptance.       Plan Discussed with: CRNA and Anesthesiologist  Anesthesia Plan Comments:        Anesthesia Quick Evaluation

## 2018-11-04 ENCOUNTER — Ambulatory Visit (HOSPITAL_COMMUNITY)
Admission: RE | Admit: 2018-11-04 | Discharge: 2018-11-04 | Disposition: A | Discharge status: Home / Self Care | Payer: Medicare HMO | Attending: Gastroenterology | Admitting: Gastroenterology

## 2018-11-04 ENCOUNTER — Encounter (HOSPITAL_COMMUNITY)
Admission: RE | Disposition: A | Discharge status: Home / Self Care | Payer: Self-pay | Source: Home / Self Care | Attending: Gastroenterology

## 2018-11-04 ENCOUNTER — Other Ambulatory Visit: Payer: Self-pay

## 2018-11-04 ENCOUNTER — Encounter (HOSPITAL_COMMUNITY): Payer: Self-pay | Admitting: Gastroenterology

## 2018-11-04 ENCOUNTER — Ambulatory Visit (HOSPITAL_COMMUNITY): Payer: Medicare HMO | Admitting: Anesthesiology

## 2018-11-04 DIAGNOSIS — K3189 Other diseases of stomach and duodenum: Secondary | ICD-10-CM | POA: Insufficient documentation

## 2018-11-04 DIAGNOSIS — I8500 Esophageal varices without bleeding: Secondary | ICD-10-CM | POA: Diagnosis not present

## 2018-11-04 DIAGNOSIS — K7460 Unspecified cirrhosis of liver: Secondary | ICD-10-CM | POA: Insufficient documentation

## 2018-11-04 DIAGNOSIS — E119 Type 2 diabetes mellitus without complications: Secondary | ICD-10-CM | POA: Diagnosis not present

## 2018-11-04 DIAGNOSIS — I10 Essential (primary) hypertension: Secondary | ICD-10-CM | POA: Diagnosis not present

## 2018-11-04 DIAGNOSIS — E669 Obesity, unspecified: Secondary | ICD-10-CM | POA: Diagnosis not present

## 2018-11-04 DIAGNOSIS — K766 Portal hypertension: Secondary | ICD-10-CM | POA: Diagnosis not present

## 2018-11-04 DIAGNOSIS — I8510 Secondary esophageal varices without bleeding: Secondary | ICD-10-CM | POA: Insufficient documentation

## 2018-11-04 DIAGNOSIS — Z6835 Body mass index (BMI) 35.0-35.9, adult: Secondary | ICD-10-CM | POA: Insufficient documentation

## 2018-11-04 DIAGNOSIS — D509 Iron deficiency anemia, unspecified: Secondary | ICD-10-CM | POA: Insufficient documentation

## 2018-11-04 DIAGNOSIS — I864 Gastric varices: Secondary | ICD-10-CM | POA: Diagnosis not present

## 2018-11-04 HISTORY — PX: ESOPHAGOGASTRODUODENOSCOPY (EGD) WITH PROPOFOL: SHX5813

## 2018-11-04 LAB — CBC
HCT: 37.6 % (ref 36.0–46.0)
Hemoglobin: 12.2 g/dL (ref 12.0–15.0)
MCH: 28.3 pg (ref 26.0–34.0)
MCHC: 32.4 g/dL (ref 30.0–36.0)
MCV: 87.2 fL (ref 80.0–100.0)
Platelets: 69 10*3/uL — ABNORMAL LOW (ref 150–400)
RBC: 4.31 MIL/uL (ref 3.87–5.11)
RDW: 19.7 % — ABNORMAL HIGH (ref 11.5–15.5)
WBC: 4.5 10*3/uL (ref 4.0–10.5)
nRBC: 0 % (ref 0.0–0.2)

## 2018-11-04 SURGERY — ESOPHAGOGASTRODUODENOSCOPY (EGD) WITH PROPOFOL
Anesthesia: Monitor Anesthesia Care

## 2018-11-04 MED ORDER — PROPOFOL 10 MG/ML IV BOLUS
INTRAVENOUS | Status: AC
  Filled 2018-11-04: qty 60

## 2018-11-04 MED ORDER — LIDOCAINE 2% (20 MG/ML) 5 ML SYRINGE
INTRAMUSCULAR | Status: DC | PRN
  Administered 2018-11-04: 80 mg via INTRAVENOUS

## 2018-11-04 MED ORDER — SODIUM CHLORIDE 0.9 % IV SOLN: INTRAVENOUS | Status: DC

## 2018-11-04 MED ORDER — PROPOFOL 500 MG/50ML IV EMUL
INTRAVENOUS | Status: DC | PRN
  Administered 2018-11-04: 75 ug/kg/min via INTRAVENOUS

## 2018-11-04 MED ORDER — LACTATED RINGERS IV SOLN
INTRAVENOUS | Status: DC
  Administered 2018-11-04: 07:00:00 via INTRAVENOUS

## 2018-11-04 MED ORDER — PROPOFOL 10 MG/ML IV BOLUS
INTRAVENOUS | Status: DC | PRN
  Administered 2018-11-04: 10 mg via INTRAVENOUS
  Administered 2018-11-04 (×3): 20 mg via INTRAVENOUS
  Administered 2018-11-04: 10 mg via INTRAVENOUS

## 2018-11-04 SURGICAL SUPPLY — 14 items

## 2018-11-04 NOTE — Discharge Instructions (Signed)
YOU HAD AN ENDOSCOPIC PROCEDURE TODAY: Refer to the procedure report and other information in the discharge instructions given to you for any specific questions about what was found during the examination. If this information does not answer your questions, please call [facility] office at [phone] to clarify.   YOU SHOULD EXPECT: Some feelings of bloating in the abdomen. Passage of more gas than usual. Walking can help get rid of the air that was put into your GI tract during the procedure and reduce the bloating. If you had a lower endoscopy (such as a colonoscopy or flexible sigmoidoscopy) you may notice spotting of blood in your stool or on the toilet paper. Some abdominal soreness may be present for a day or two, also.  DIET: Your first meal following the procedure should be a light meal and then it is ok to progress to your normal diet. A half-sandwich or bowl of soup is an example of a good first meal. Heavy or fried foods are harder to digest and may make you feel nauseous or bloated. Drink plenty of fluids but you should avoid alcoholic beverages for 24 hours. If you had a esophageal dilation, please see attached instructions for diet.   ACTIVITY: Your care partner should take you home directly after the procedure. You should plan to take it easy, moving slowly for the rest of the day. You can resume normal activity the day after the procedure however YOU SHOULD NOT DRIVE, use power tools, machinery or perform tasks that involve climbing or major physical exertion for 24 hours (because of the sedation medicines used during the test).   SYMPTOMS TO REPORT IMMEDIATELY: A gastroenterologist can be reached at any hour. Please call [phone]  for any of the following symptoms:    Following upper endoscopy (EGD, EUS, ERCP, esophageal dilation) Vomiting of blood or coffee ground material  New, significant abdominal pain  New, significant chest pain or pain under the shoulder blades  Painful or  persistently difficult swallowing  New shortness of breath  Black, tarry-looking or red, bloody stools  FOLLOW UP:  If any biopsies were taken you will be contacted by phone or by letter within the next 1-3 weeks. Call [phone]  if you have not heard about the biopsies in 3 weeks.  Please also call with any specific questions about appointments or follow up tests.  Call if question or problem otherwise call tomorrow for lab results and try to get with my nurse Pamala Hurry about iron samples and try other pharmacies to see if they have better availability and follow-up in 4 weeks for at least repeat lab work

## 2018-11-04 NOTE — Anesthesia Postprocedure Evaluation (Signed)
Anesthesia Post Note  Patient: Courtney Braun  Procedure(s) Performed: ESOPHAGOGASTRODUODENOSCOPY (EGD) WITH PROPOFOL (N/A )     Patient location during evaluation: PACU Anesthesia Type: MAC Level of consciousness: awake and alert Pain management: pain level controlled Vital Signs Assessment: post-procedure vital signs reviewed and stable Respiratory status: spontaneous breathing, nonlabored ventilation and respiratory function stable Cardiovascular status: stable and blood pressure returned to baseline Anesthetic complications: no    Last Vitals:  Vitals:   11/04/18 0820 11/04/18 0830  BP: 129/83   Pulse: 62 65  Resp: 17 16  Temp:    SpO2: 94% 97%    Last Pain:  Vitals:   11/04/18 0824  TempSrc:   PainSc: 0-No pain                 Audry Pili

## 2018-11-04 NOTE — Op Note (Signed)
[facility] Patient Name: Courtney Braun Procedure Date: 11/04/2018 MRN: [id number] Attending MD: Clarene Essex , MD Date of Birth: 06/03/1950 CSN: [id number] Age: 68 Admit Type: Outpatient Procedure:                Upper GI endoscopy Indications:              Iron deficiency anemia, Follow-up of esophageal                            varices Providers:                Clarene Essex, MD, Angus Seller, William Dalton,                            Technician Referring MD:              Medicines:                Propofol total dose 180 mg IV, 80 milligrams IV                            lidocaine Complications:            No immediate complications. Estimated Blood Loss:     Estimated blood loss: none. Procedure:                Pre-Anesthesia Assessment:                           - Prior to the procedure, a History and Physical                            was performed, and patient medications and                            allergies were reviewed. The patient's tolerance of                            previous anesthesia was also reviewed. The risks                            and benefits of the procedure and the sedation                            options and risks were discussed with the patient.                            All questions were answered, and informed consent                            was obtained. Prior Anticoagulants: The patient has                            taken no previous anticoagulant or antiplatelet                            agents. ASA Grade Assessment: II -  A patient with                            mild systemic disease. After reviewing the risks                            and benefits, the patient was deemed in                            satisfactory condition to undergo the procedure.                           After obtaining informed consent, the endoscope was                            passed under direct vision. Throughout the          procedure, the patient's blood pressure, pulse, and                            oxygen saturations were monitored continuously. The                            [id number]-H190 ([id number]) Olympus gastroscope was                            introduced through the mouth, and advanced to the                            second part of duodenum. The upper GI endoscopy was                            accomplished without difficulty. The patient                            tolerated the procedure well. Scope In: Scope Out: Findings:      The larynx was normal.      Grade I varices were found in the middle third of the esophagus and in       the lower third of the esophagus. One may have had a small red spot but       no other at risk lesions but they did seem to flatten out fairly nicely      Varices with no bleeding were found in the gastric fundus. There were no       stigmata of recent bleeding. They were medium in largest diameter.      Diffuse portal hypertensive gastropathy was found in the entire examined       stomach.      The duodenal bulb, first portion of the duodenum and second portion of       the duodenum were normal.      The exam was otherwise without abnormality. Impression:               - Normal larynx.                           - Grade I  esophageal varices.                           - Gastric varices, without bleeding.                           - Portal hypertensive gastropathy.                           - Normal duodenal bulb, first portion of the                            duodenum and second portion of the duodenum.                           - The examination was otherwise normal.                           - No specimens collected. Moderate Sedation:      Not Applicable - Patient had care per Anesthesia. Recommendation:           - Patient has a contact number available for                            emergencies. The signs and symptoms of potential                             delayed complications were discussed with the                            patient. Return to normal activities tomorrow.                            Written discharge instructions were provided to the                            patient.                           - Soft diet today.                           - Continue present medications.                           - Return to GI clinic in 4 weeks. Repeat CBC on iron                           - Telephone GI clinic if symptomatic PRN. Procedure Code(s):        --- Professional ---                           [phone], Esophagogastroduodenoscopy, flexible,                            transoral; diagnostic, including collection of  specimen(s) by brushing or washing, when performed                            (separate procedure) Diagnosis Code(s):        --- Professional ---                           I85.00, Esophageal varices without bleeding                           I86.4, Gastric varices                           K76.6, Portal hypertension                           K31.89, Other diseases of stomach and duodenum                           D50.9, Iron deficiency anemia, unspecified CPT copyright 2019 [employer] Association. All rights reserved. The codes documented in this report are preliminary and upon coder review may  be revised to meet current compliance requirements. Clarene Essex, MD 11/04/2018 8:08:13 AM This report has been signed electronically. Number of Addenda: 0

## 2018-11-04 NOTE — Transfer of Care (Signed)
Immediate Anesthesia Transfer of Care Note  Patient: Courtney Braun  Procedure(s) Performed: ESOPHAGOGASTRODUODENOSCOPY (EGD) WITH PROPOFOL (N/A )  Patient Location: Endoscopy Unit  Anesthesia Type:MAC  Level of Consciousness: drowsy and patient cooperative  Airway & Oxygen Therapy: Patient Spontanous Breathing and Patient connected to face mask oxygen  Post-op Assessment: Report given to RN and Post -op Vital signs reviewed and stable  Post vital signs: Reviewed and stable  Last Vitals:  Vitals Value Taken Time  BP 132/48 11/04/18 0807  Temp    Pulse 65 11/04/18 0808  Resp 17 11/04/18 0808  SpO2 96 % 11/04/18 0808  Vitals shown include unvalidated device data.  Last Pain:  Vitals:   11/04/18 0807  TempSrc: Oral  PainSc: 0-No pain         Complications: No apparent anesthesia complications

## 2018-11-04 NOTE — Progress Notes (Signed)
Courtney Braun 7:39 AM  Subjective: Patient without any new medical problems since we last saw her and other than fatigue has no signs of bleeding and has been unable to get her prescription iron due to probable backorder and has no new complaints  Objective: Vital signs stable afebrile no acute distress exam please see preassessment evaluation repeat hemoglobin pending  Assessment: Cirrhosis and varices and worsening anemia  Plan: Okay to proceed with endoscopy and possible banding with anesthesia assistance  [facility]  office [phone] After 5PM or if no answer call [phone]

## 2018-11-05 ENCOUNTER — Encounter (HOSPITAL_COMMUNITY): Payer: Self-pay | Admitting: Gastroenterology

## 2018-11-05 NOTE — Progress Notes (Signed)
No answer

## 2018-12-08 DIAGNOSIS — Z86010 Personal history of colonic polyps: Secondary | ICD-10-CM | POA: Diagnosis not present

## 2018-12-08 DIAGNOSIS — D649 Anemia, unspecified: Secondary | ICD-10-CM | POA: Diagnosis not present

## 2018-12-08 DIAGNOSIS — K7460 Unspecified cirrhosis of liver: Secondary | ICD-10-CM | POA: Diagnosis not present

## 2018-12-24 DIAGNOSIS — I10 Essential (primary) hypertension: Secondary | ICD-10-CM | POA: Diagnosis not present

## 2018-12-24 DIAGNOSIS — E782 Mixed hyperlipidemia: Secondary | ICD-10-CM | POA: Diagnosis not present

## 2018-12-24 DIAGNOSIS — E1169 Type 2 diabetes mellitus with other specified complication: Secondary | ICD-10-CM | POA: Diagnosis not present

## 2018-12-26 DIAGNOSIS — K7460 Unspecified cirrhosis of liver: Secondary | ICD-10-CM | POA: Diagnosis not present

## 2018-12-26 DIAGNOSIS — Z86010 Personal history of colonic polyps: Secondary | ICD-10-CM | POA: Diagnosis not present

## 2018-12-26 DIAGNOSIS — K7581 Nonalcoholic steatohepatitis (NASH): Secondary | ICD-10-CM | POA: Diagnosis not present

## 2018-12-26 DIAGNOSIS — Z0000 Encounter for general adult medical examination without abnormal findings: Secondary | ICD-10-CM | POA: Diagnosis not present

## 2018-12-26 DIAGNOSIS — E782 Mixed hyperlipidemia: Secondary | ICD-10-CM | POA: Diagnosis not present

## 2018-12-26 DIAGNOSIS — D649 Anemia, unspecified: Secondary | ICD-10-CM | POA: Diagnosis not present

## 2018-12-26 DIAGNOSIS — E1169 Type 2 diabetes mellitus with other specified complication: Secondary | ICD-10-CM | POA: Diagnosis not present

## 2018-12-26 DIAGNOSIS — I10 Essential (primary) hypertension: Secondary | ICD-10-CM | POA: Diagnosis not present

## 2018-12-29 ENCOUNTER — Other Ambulatory Visit: Payer: Self-pay | Admitting: Family Medicine

## 2018-12-29 DIAGNOSIS — D649 Anemia, unspecified: Secondary | ICD-10-CM | POA: Diagnosis not present

## 2018-12-29 DIAGNOSIS — Z23 Encounter for immunization: Secondary | ICD-10-CM | POA: Diagnosis not present

## 2018-12-29 DIAGNOSIS — Z1231 Encounter for screening mammogram for malignant neoplasm of breast: Secondary | ICD-10-CM

## 2018-12-29 DIAGNOSIS — M8580 Other specified disorders of bone density and structure, unspecified site: Secondary | ICD-10-CM

## 2019-01-06 DIAGNOSIS — K7581 Nonalcoholic steatohepatitis (NASH): Secondary | ICD-10-CM | POA: Diagnosis not present

## 2019-01-06 DIAGNOSIS — K7460 Unspecified cirrhosis of liver: Secondary | ICD-10-CM | POA: Diagnosis not present

## 2019-01-06 DIAGNOSIS — I8500 Esophageal varices without bleeding: Secondary | ICD-10-CM | POA: Diagnosis not present

## 2019-01-06 DIAGNOSIS — K3189 Other diseases of stomach and duodenum: Secondary | ICD-10-CM | POA: Diagnosis not present

## 2019-01-12 ENCOUNTER — Other Ambulatory Visit: Payer: Self-pay | Admitting: Nurse Practitioner

## 2019-01-12 DIAGNOSIS — K7581 Nonalcoholic steatohepatitis (NASH): Secondary | ICD-10-CM

## 2019-01-12 DIAGNOSIS — K7460 Unspecified cirrhosis of liver: Secondary | ICD-10-CM

## 2019-01-16 ENCOUNTER — Ambulatory Visit
Admission: RE | Admit: 2019-01-16 | Discharge: 2019-01-16 | Disposition: A | Discharge status: Home / Self Care | Payer: Medicare HMO | Source: Ambulatory Visit | Attending: Nurse Practitioner | Admitting: Nurse Practitioner

## 2019-01-16 DIAGNOSIS — K8020 Calculus of gallbladder without cholecystitis without obstruction: Secondary | ICD-10-CM | POA: Diagnosis not present

## 2019-01-16 DIAGNOSIS — K7460 Unspecified cirrhosis of liver: Secondary | ICD-10-CM

## 2019-01-16 DIAGNOSIS — K7581 Nonalcoholic steatohepatitis (NASH): Secondary | ICD-10-CM

## 2019-02-02 DIAGNOSIS — Z23 Encounter for immunization: Secondary | ICD-10-CM | POA: Diagnosis not present

## 2019-03-17 ENCOUNTER — Ambulatory Visit
Admission: RE | Admit: 2019-03-17 | Discharge: 2019-03-17 | Disposition: A | Discharge status: Home / Self Care | Payer: Medicare HMO | Source: Ambulatory Visit | Attending: Physician Assistant | Admitting: Physician Assistant

## 2019-03-17 ENCOUNTER — Other Ambulatory Visit: Payer: Self-pay | Admitting: Physician Assistant

## 2019-03-17 DIAGNOSIS — W19XXXA Unspecified fall, initial encounter: Secondary | ICD-10-CM

## 2019-03-17 DIAGNOSIS — R300 Dysuria: Secondary | ICD-10-CM | POA: Diagnosis not present

## 2019-03-17 DIAGNOSIS — M25569 Pain in unspecified knee: Secondary | ICD-10-CM | POA: Diagnosis not present

## 2019-03-17 DIAGNOSIS — M25462 Effusion, left knee: Secondary | ICD-10-CM | POA: Diagnosis not present

## 2019-03-18 ENCOUNTER — Other Ambulatory Visit: Payer: Medicare HMO

## 2019-03-18 ENCOUNTER — Ambulatory Visit: Payer: Medicare HMO

## 2019-06-09 ENCOUNTER — Ambulatory Visit
Admission: RE | Admit: 2019-06-09 | Discharge: 2019-06-09 | Disposition: A | Discharge status: Home / Self Care | Payer: Medicare HMO | Source: Ambulatory Visit | Attending: Family Medicine | Admitting: Family Medicine

## 2019-06-09 ENCOUNTER — Other Ambulatory Visit: Payer: Self-pay

## 2019-06-09 DIAGNOSIS — Z1231 Encounter for screening mammogram for malignant neoplasm of breast: Secondary | ICD-10-CM | POA: Diagnosis not present

## 2019-06-09 DIAGNOSIS — M8580 Other specified disorders of bone density and structure, unspecified site: Secondary | ICD-10-CM

## 2019-07-09 DIAGNOSIS — H52221 Regular astigmatism, right eye: Secondary | ICD-10-CM | POA: Diagnosis not present

## 2019-07-09 DIAGNOSIS — E119 Type 2 diabetes mellitus without complications: Secondary | ICD-10-CM | POA: Diagnosis not present

## 2019-07-09 DIAGNOSIS — H5203 Hypermetropia, bilateral: Secondary | ICD-10-CM | POA: Diagnosis not present

## 2019-07-09 DIAGNOSIS — H524 Presbyopia: Secondary | ICD-10-CM | POA: Diagnosis not present

## 2019-07-09 DIAGNOSIS — I10 Essential (primary) hypertension: Secondary | ICD-10-CM | POA: Diagnosis not present

## 2019-07-10 ENCOUNTER — Other Ambulatory Visit: Payer: Medicare HMO

## 2019-07-16 DIAGNOSIS — I8500 Esophageal varices without bleeding: Secondary | ICD-10-CM | POA: Diagnosis not present

## 2019-07-16 DIAGNOSIS — K7581 Nonalcoholic steatohepatitis (NASH): Secondary | ICD-10-CM | POA: Diagnosis not present

## 2019-07-22 DIAGNOSIS — K7460 Unspecified cirrhosis of liver: Secondary | ICD-10-CM | POA: Diagnosis not present

## 2019-07-22 DIAGNOSIS — E669 Obesity, unspecified: Secondary | ICD-10-CM | POA: Diagnosis not present

## 2019-07-22 DIAGNOSIS — Z23 Encounter for immunization: Secondary | ICD-10-CM | POA: Diagnosis not present

## 2019-07-22 DIAGNOSIS — E1169 Type 2 diabetes mellitus with other specified complication: Secondary | ICD-10-CM | POA: Diagnosis not present

## 2019-07-22 DIAGNOSIS — E782 Mixed hyperlipidemia: Secondary | ICD-10-CM | POA: Diagnosis not present

## 2019-07-22 DIAGNOSIS — I10 Essential (primary) hypertension: Secondary | ICD-10-CM | POA: Diagnosis not present

## 2019-07-23 ENCOUNTER — Other Ambulatory Visit: Payer: Self-pay | Admitting: Nurse Practitioner

## 2019-07-23 DIAGNOSIS — K7469 Other cirrhosis of liver: Secondary | ICD-10-CM

## 2019-07-30 ENCOUNTER — Ambulatory Visit
Admission: RE | Admit: 2019-07-30 | Discharge: 2019-07-30 | Disposition: A | Discharge status: Home / Self Care | Payer: Medicare HMO | Source: Ambulatory Visit | Attending: Nurse Practitioner | Admitting: Nurse Practitioner

## 2019-07-30 DIAGNOSIS — K7469 Other cirrhosis of liver: Secondary | ICD-10-CM

## 2019-07-30 DIAGNOSIS — K7460 Unspecified cirrhosis of liver: Secondary | ICD-10-CM | POA: Diagnosis not present

## 2019-09-02 DIAGNOSIS — E1169 Type 2 diabetes mellitus with other specified complication: Secondary | ICD-10-CM | POA: Diagnosis not present

## 2019-09-02 DIAGNOSIS — R300 Dysuria: Secondary | ICD-10-CM | POA: Diagnosis not present

## 2019-09-14 ENCOUNTER — Other Ambulatory Visit: Payer: Self-pay

## 2019-09-14 ENCOUNTER — Ambulatory Visit
Admission: RE | Admit: 2019-09-14 | Discharge: 2019-09-14 | Disposition: A | Discharge status: Home / Self Care | Payer: Medicare HMO | Source: Ambulatory Visit | Attending: Family Medicine | Admitting: Family Medicine

## 2019-09-14 DIAGNOSIS — Z780 Asymptomatic menopausal state: Secondary | ICD-10-CM | POA: Diagnosis not present

## 2020-01-08 DIAGNOSIS — Z86010 Personal history of colonic polyps: Secondary | ICD-10-CM | POA: Diagnosis not present

## 2020-01-08 DIAGNOSIS — E782 Mixed hyperlipidemia: Secondary | ICD-10-CM | POA: Diagnosis not present

## 2020-01-08 DIAGNOSIS — E1169 Type 2 diabetes mellitus with other specified complication: Secondary | ICD-10-CM | POA: Diagnosis not present

## 2020-01-08 DIAGNOSIS — D649 Anemia, unspecified: Secondary | ICD-10-CM | POA: Diagnosis not present

## 2020-01-08 DIAGNOSIS — M8580 Other specified disorders of bone density and structure, unspecified site: Secondary | ICD-10-CM | POA: Diagnosis not present

## 2020-01-08 DIAGNOSIS — I8500 Esophageal varices without bleeding: Secondary | ICD-10-CM | POA: Diagnosis not present

## 2020-01-08 DIAGNOSIS — K7460 Unspecified cirrhosis of liver: Secondary | ICD-10-CM | POA: Diagnosis not present

## 2020-01-08 DIAGNOSIS — Z23 Encounter for immunization: Secondary | ICD-10-CM | POA: Diagnosis not present

## 2020-01-08 DIAGNOSIS — Z0000 Encounter for general adult medical examination without abnormal findings: Secondary | ICD-10-CM | POA: Diagnosis not present

## 2020-01-08 DIAGNOSIS — I10 Essential (primary) hypertension: Secondary | ICD-10-CM | POA: Diagnosis not present

## 2020-01-08 DIAGNOSIS — K7581 Nonalcoholic steatohepatitis (NASH): Secondary | ICD-10-CM | POA: Diagnosis not present

## 2020-06-08 DIAGNOSIS — K7581 Nonalcoholic steatohepatitis (NASH): Secondary | ICD-10-CM | POA: Diagnosis not present

## 2020-06-08 DIAGNOSIS — I8500 Esophageal varices without bleeding: Secondary | ICD-10-CM | POA: Diagnosis not present

## 2020-06-09 ENCOUNTER — Other Ambulatory Visit: Payer: Self-pay | Admitting: Nurse Practitioner

## 2020-06-09 DIAGNOSIS — K7469 Other cirrhosis of liver: Secondary | ICD-10-CM

## 2020-06-15 DIAGNOSIS — K7469 Other cirrhosis of liver: Secondary | ICD-10-CM | POA: Diagnosis not present

## 2020-06-15 DIAGNOSIS — K7581 Nonalcoholic steatohepatitis (NASH): Secondary | ICD-10-CM | POA: Diagnosis not present

## 2020-06-20 DIAGNOSIS — E669 Obesity, unspecified: Secondary | ICD-10-CM | POA: Diagnosis not present

## 2020-06-20 DIAGNOSIS — I10 Essential (primary) hypertension: Secondary | ICD-10-CM | POA: Diagnosis not present

## 2020-06-20 DIAGNOSIS — K7460 Unspecified cirrhosis of liver: Secondary | ICD-10-CM | POA: Diagnosis not present

## 2020-06-20 DIAGNOSIS — E1169 Type 2 diabetes mellitus with other specified complication: Secondary | ICD-10-CM | POA: Diagnosis not present

## 2020-06-20 DIAGNOSIS — E782 Mixed hyperlipidemia: Secondary | ICD-10-CM | POA: Diagnosis not present

## 2020-06-22 DIAGNOSIS — K5730 Diverticulosis of large intestine without perforation or abscess without bleeding: Secondary | ICD-10-CM | POA: Diagnosis not present

## 2020-06-22 DIAGNOSIS — K635 Polyp of colon: Secondary | ICD-10-CM | POA: Diagnosis not present

## 2020-06-22 DIAGNOSIS — Z86010 Personal history of colonic polyps: Secondary | ICD-10-CM | POA: Diagnosis not present

## 2020-06-24 DIAGNOSIS — Z86010 Personal history of colonic polyps: Secondary | ICD-10-CM | POA: Diagnosis not present

## 2020-06-27 DIAGNOSIS — E1169 Type 2 diabetes mellitus with other specified complication: Secondary | ICD-10-CM | POA: Diagnosis not present

## 2020-06-27 DIAGNOSIS — E782 Mixed hyperlipidemia: Secondary | ICD-10-CM | POA: Diagnosis not present

## 2020-06-29 ENCOUNTER — Ambulatory Visit
Admission: RE | Admit: 2020-06-29 | Discharge: 2020-06-29 | Disposition: A | Discharge status: Home / Self Care | Payer: Medicare HMO | Source: Ambulatory Visit | Attending: Nurse Practitioner | Admitting: Nurse Practitioner

## 2020-06-29 DIAGNOSIS — K7469 Other cirrhosis of liver: Secondary | ICD-10-CM

## 2020-06-29 DIAGNOSIS — K8020 Calculus of gallbladder without cholecystitis without obstruction: Secondary | ICD-10-CM | POA: Diagnosis not present

## 2020-06-29 DIAGNOSIS — K7460 Unspecified cirrhosis of liver: Secondary | ICD-10-CM | POA: Diagnosis not present

## 2020-11-18 ENCOUNTER — Other Ambulatory Visit: Payer: Self-pay

## 2020-11-18 ENCOUNTER — Encounter (HOSPITAL_COMMUNITY): Payer: Self-pay

## 2020-11-18 ENCOUNTER — Emergency Department (HOSPITAL_COMMUNITY): Payer: Medicare HMO

## 2020-11-18 ENCOUNTER — Emergency Department (HOSPITAL_COMMUNITY)
Admission: EM | Admit: 2020-11-18 | Discharge: 2020-11-19 | Disposition: A | Discharge status: Home / Self Care | Payer: Medicare HMO | Attending: Emergency Medicine | Admitting: Emergency Medicine

## 2020-11-18 DIAGNOSIS — E860 Dehydration: Secondary | ICD-10-CM | POA: Diagnosis not present

## 2020-11-18 DIAGNOSIS — R112 Nausea with vomiting, unspecified: Secondary | ICD-10-CM | POA: Insufficient documentation

## 2020-11-18 DIAGNOSIS — R110 Nausea: Secondary | ICD-10-CM | POA: Diagnosis not present

## 2020-11-18 DIAGNOSIS — A084 Viral intestinal infection, unspecified: Secondary | ICD-10-CM | POA: Diagnosis not present

## 2020-11-18 DIAGNOSIS — Z20822 Contact with and (suspected) exposure to covid-19: Secondary | ICD-10-CM | POA: Insufficient documentation

## 2020-11-18 DIAGNOSIS — R42 Dizziness and giddiness: Secondary | ICD-10-CM

## 2020-11-18 DIAGNOSIS — I10 Essential (primary) hypertension: Secondary | ICD-10-CM | POA: Diagnosis not present

## 2020-11-18 DIAGNOSIS — Z79899 Other long term (current) drug therapy: Secondary | ICD-10-CM | POA: Diagnosis not present

## 2020-11-18 DIAGNOSIS — E119 Type 2 diabetes mellitus without complications: Secondary | ICD-10-CM | POA: Diagnosis not present

## 2020-11-18 DIAGNOSIS — S2231XA Fracture of one rib, right side, initial encounter for closed fracture: Secondary | ICD-10-CM | POA: Diagnosis not present

## 2020-11-18 DIAGNOSIS — R52 Pain, unspecified: Secondary | ICD-10-CM

## 2020-11-18 LAB — CBC WITH DIFFERENTIAL/PLATELET
Abs Immature Granulocytes: 0.01 10*3/uL (ref 0.00–0.07)
Basophils Absolute: 0 10*3/uL (ref 0.0–0.1)
Basophils Relative: 0 %
Eosinophils Absolute: 0.1 10*3/uL (ref 0.0–0.5)
Eosinophils Relative: 1 %
HCT: 35 % — ABNORMAL LOW (ref 36.0–46.0)
Hemoglobin: 12.3 g/dL (ref 12.0–15.0)
Immature Granulocytes: 0 %
Lymphocytes Relative: 13 %
Lymphs Abs: 0.9 10*3/uL (ref 0.7–4.0)
MCH: 32.2 pg (ref 26.0–34.0)
MCHC: 35.1 g/dL (ref 30.0–36.0)
MCV: 91.6 fL (ref 80.0–100.0)
Monocytes Absolute: 0.9 10*3/uL (ref 0.1–1.0)
Monocytes Relative: 13 %
Neutro Abs: 5.5 10*3/uL (ref 1.7–7.7)
Neutrophils Relative %: 73 %
Platelets: 82 10*3/uL — ABNORMAL LOW (ref 150–400)
RBC: 3.82 MIL/uL — ABNORMAL LOW (ref 3.87–5.11)
RDW: 15 % (ref 11.5–15.5)
Smear Review: NORMAL
WBC: 7.5 10*3/uL (ref 4.0–10.5)
nRBC: 0 % (ref 0.0–0.2)

## 2020-11-18 LAB — COMPREHENSIVE METABOLIC PANEL
ALT: 42 U/L (ref 0–44)
AST: 62 U/L — ABNORMAL HIGH (ref 15–41)
Albumin: 2.8 g/dL — ABNORMAL LOW (ref 3.5–5.0)
Alkaline Phosphatase: 113 U/L (ref 38–126)
Anion gap: 8 (ref 5–15)
BUN: 15 mg/dL (ref 8–23)
CO2: 28 mmol/L (ref 22–32)
Calcium: 9.1 mg/dL (ref 8.9–10.3)
Chloride: 99 mmol/L (ref 98–111)
Creatinine, Ser: 0.91 mg/dL (ref 0.44–1.00)
GFR, Estimated: >60 mL/min (ref >60)
Glucose, Bld: 105 mg/dL — ABNORMAL HIGH (ref 70–99)
Potassium: 3.6 mmol/L (ref 3.5–5.1)
Sodium: 135 mmol/L (ref 135–145)
Total Bilirubin: 2.3 mg/dL — ABNORMAL HIGH (ref 0.3–1.2)
Total Protein: 6.6 g/dL (ref 6.5–8.1)

## 2020-11-18 LAB — RESP PANEL BY RT-PCR (FLU A&B, COVID) ARPGX2
Influenza A by PCR: NEGATIVE
Influenza B by PCR: NEGATIVE
SARS Coronavirus 2 by RT PCR: NEGATIVE

## 2020-11-18 MED ORDER — ONDANSETRON HCL 4 MG/2ML IJ SOLN
4.0000 mg | Freq: Once | INTRAMUSCULAR | Status: AC
  Administered 2020-11-18: 4 mg via INTRAVENOUS
  Filled 2020-11-18: qty 2

## 2020-11-18 MED ORDER — SODIUM CHLORIDE 0.9 % IV SOLN
1000.0000 mL | INTRAVENOUS | Status: DC
  Administered 2020-11-18: 1000 mL via INTRAVENOUS

## 2020-11-18 MED ORDER — SODIUM CHLORIDE 0.9 % IV BOLUS (SEPSIS)
1000.0000 mL | Freq: Once | INTRAVENOUS | Status: AC
  Administered 2020-11-18: 1000 mL via INTRAVENOUS

## 2020-11-18 NOTE — ED Triage Notes (Signed)
Pt sent from PCP office for further evaluation of dizziness and nausea since yesterday. Pt had 3 episodes of emesis last night, none today. Pt has also had some confusion and trouble finding her words, trouble writing- all started yesterday afternoon. Pt a.o, denies dizziness at this time.

## 2020-11-18 NOTE — ED Provider Notes (Signed)
Emergency Medicine Provider Triage Evaluation Note  Courtney Braun , a 70 y.o. female  was evaluated in triage.  Pt complains of nausea and dizziness. Reported nausea, vomiting, and diarrhea last night. Has been feeling weak, dizzy, and confused since. No falls. Saw family doctor today who dx her with viral gastroenteritis and sent her to ED to get fluids. At home COVID test was normal.  Review of Systems  Positive: Fevers, chills, cough, nausea, dizziness, fatigue Negative: CP, SOB, congestion, abdominal pain  Physical Exam  BP (!) 128/58 (BP Location: Left Arm)   Pulse 93   Temp 98.8 F (37.1 C) (Oral)   Resp 16   Ht 6' (1.829 m)   Wt 113.4 kg   SpO2 93%   BMI 33.91 kg/m  Gen:   Awake, no distress. A&O x 3   Resp:  Normal effort  MSK:   Moves extremities without difficulty  Other:    Medical Decision Making  Medically screening exam initiated at 5:52 PM.  Appropriate orders placed.  Courtney Braun was informed that the remainder of the evaluation will be completed by another provider, this initial triage assessment does not replace that evaluation, and the importance of remaining in the ED until their evaluation is complete.     Courtney Braun 11/18/20 1757    Sherwood Gambler, MD 11/21/20 2328

## 2020-11-18 NOTE — ED Provider Notes (Signed)
[facility] EMERGENCY DEPARTMENT Provider Note   CSN: [id number] Arrival date & time: 11/18/20  1717     History Chief Complaint  Patient presents with   Dizziness   Emesis    Courtney Braun is a 70 y.o. female.   Dizziness Associated symptoms: vomiting   Emesis  Patient presents to the ED with complaints of dizziness and confusion.  Patient states she had 1 episode of nausea and vomiting since yesterday.  She also had 1 episode of diarrhea last night.  She started to feel weak and lightheaded.  She is also felt somewhat confused.  She thinks at times her speech has been a little bit slurred.  She did a home COVID test that was negative.  She went to see her primary doctor who thought she could be having issues with dehydration related to viral illness.  She was sent to the ED for IV fluids and further evaluation.  Patient states she has had some occasional cough.  She is not having any chest pain or shortness of breath.  No known fevers or chills.  No blood in her stool  Past Medical History:  Diagnosis Date   Anemia    Diabetes mellitus without complication ([place])    type II   History of blood transfusion    Hyperlipidemia    Hypertension     There are no problems to display for this patient.   Past Surgical History:  Procedure Laterality Date   ESOPHAGOGASTRODUODENOSCOPY N/A 08/06/2016   Procedure: ESOPHAGOGASTRODUODENOSCOPY (EGD);  Surgeon: Clarene Essex, MD;  Location: [facility];  Service: Endoscopy;  Laterality: N/A;   ESOPHAGOGASTRODUODENOSCOPY (EGD) WITH PROPOFOL N/A 05/09/2018   Procedure: ESOPHAGOGASTRODUODENOSCOPY (EGD) WITH PROPOFOL;  Surgeon: Clarene Essex, MD;  Location: [facility];  Service: Endoscopy;  Laterality: N/A;   ESOPHAGOGASTRODUODENOSCOPY (EGD) WITH PROPOFOL N/A 11/04/2018   Procedure: ESOPHAGOGASTRODUODENOSCOPY (EGD) WITH PROPOFOL;  Surgeon: Clarene Essex, MD;  Location: [facility];  Service: Endoscopy;  Laterality: N/A;    GASTRIC VARICES BANDING N/A 05/09/2018   Procedure: GASTRIC VARICES BANDING;  Surgeon: Clarene Essex, MD;  Location: [facility];  Service: Endoscopy;  Laterality: N/A;   none     TONSILLECTOMY       OB History   No obstetric history on file.     Family History  Problem Relation Age of Onset   Hyperlipidemia Other     Social History   Tobacco Use   Smoking status: Never   Smokeless tobacco: Never  Vaping Use   Vaping Use: Never used  Substance Use Topics   Alcohol use: Yes    Comment: occasionally   Drug use: No    Home Medications Prior to Admission medications   Medication Sig Start Date End Date Taking? Authorizing Provider  ondansetron (ZOFRAN ODT) 8 MG disintegrating tablet Take 1 tablet (8 mg total) by mouth every 8 (eight) hours as needed for nausea or vomiting. 11/19/20  Yes Dorie Rank, MD  acetaminophen (TYLENOL) 500 MG tablet Take 1,000 mg by mouth every 6 (six) hours as needed for moderate pain or headache.    [provider]  cetirizine (ZYRTEC) 10 MG tablet Take 10 mg by mouth daily as needed for allergies.    [provider]  ferrous sulfate 325 (65 FE) MG tablet Take 325 mg by mouth daily with breakfast.    [provider]  lisinopril-hydrochlorothiazide (ZESTORETIC) 20-12.5 MG tablet Take 1 tablet by mouth daily. 08/19/18   [provider]  Multiple  Vitamin (MULTIVITAMIN WITH MINERALS) TABS tablet Take 1 tablet by mouth daily.    [provider]  ondansetron (ZOFRAN) 8 MG tablet Take 8 mg by mouth 2 (two) times daily as needed for nausea/vomiting. 05/29/18   [provider]  propranolol (INDERAL) 40 MG tablet Take 20 mg by mouth 2 (two) times daily. 09/12/18   [provider]  simvastatin (ZOCOR) 40 MG tablet Take 40 mg by mouth at bedtime.     [provider]    Allergies    Metformin and related and Voltaren [diclofenac]  Review of Systems   Review of Systems  Gastrointestinal:  Positive  for vomiting.  Neurological:  Positive for dizziness.  All other systems reviewed and are negative.  Physical Exam Updated Vital Signs BP (!) 128/58   Pulse 92   Temp 98.5 F (36.9 C) (Oral)   Resp 16   Ht 1.829 m (6')   Wt 113.4 kg   SpO2 98%   BMI 33.91 kg/m   Physical Exam Vitals and nursing note reviewed.  Constitutional:      General: She is not in acute distress.    Appearance: She is well-developed.  HENT:     Head: Normocephalic and atraumatic.     Right Ear: External ear normal.     Left Ear: External ear normal.  Eyes:     General: No scleral icterus.       Right eye: No discharge.        Left eye: No discharge.     Conjunctiva/sclera: Conjunctivae normal.  Neck:     Trachea: No tracheal deviation.  Cardiovascular:     Rate and Rhythm: Normal rate and regular rhythm.  Pulmonary:     Effort: Pulmonary effort is normal. No respiratory distress.     Breath sounds: Normal breath sounds. No stridor. No wheezing or rales.  Abdominal:     General: Bowel sounds are normal. There is no distension.     Palpations: Abdomen is soft.     Tenderness: There is no abdominal tenderness. There is no guarding or rebound.  Musculoskeletal:        General: No tenderness.     Cervical back: Neck supple.  Skin:    General: Skin is warm and dry.     Findings: No rash.  Neurological:     Mental Status: She is alert and oriented to person, place, and time.     Cranial Nerves: No cranial nerve deficit (No facial droop, extraocular movements intact, tongue midline ).     Sensory: No sensory deficit.     Motor: Weakness present. No abnormal muscle tone or seizure activity.     Coordination: Coordination normal.     Comments: No pronator drift bilateral upper extrem, able to hold both legs off bed for 5 seconds, sensation intact in all extremities, no visual field cuts, no left or right sided neglect, normal finger-nose exam bilaterally, no nystagmus noted     ED Results /  Procedures / Treatments   Labs (all labs ordered are listed, but only abnormal results are displayed) Labs Reviewed  CBC WITH DIFFERENTIAL/PLATELET - Abnormal; Notable for the following components:      Result Value   RBC 3.82 (*)    HCT 35.0 (*)    Platelets 82 (*)    All other components within normal limits  COMPREHENSIVE METABOLIC PANEL - Abnormal; Notable for the following components:   Glucose, Bld 105 (*)    Albumin 2.8 (*)  AST 62 (*)    Total Bilirubin 2.3 (*)    All other components within normal limits  RESP PANEL BY RT-PCR (FLU A&B, COVID) [id number]    EKG EKG Interpretation  Date/Time:  Friday November 18 2020 17:31:25 EDT Ventricular Rate:  91 PR Interval:  148 QRS Duration: 92 QT Interval:  356 QTC Calculation: 437 R Axis:   19 Text Interpretation: Sinus rhythm with Premature atrial complexes Nonspecific ST abnormality Abnormal ECG No old tracing to compare Confirmed by Dorie Rank [phone]) on 11/18/2020 11:26:28 PM  Radiology DG Chest 1 View  Result Date: 11/18/2020 CLINICAL DATA:  Dizziness nausea EXAM: CHEST  1 VIEW COMPARISON:  07/19/2014 FINDINGS: No focal opacity or pleural effusion. Cardiomediastinal silhouette within normal limits. Aortic atherosclerosis. No pneumothorax. Multiple chronic appearing right rib fractures. IMPRESSION: No active disease. Electronically Signed   By: Donavan Foil M.D.   On: 11/18/2020 21:05   CT HEAD WO CONTRAST (5MM)  Result Date: 11/18/2020 CLINICAL DATA:  Dizziness nausea EXAM: CT HEAD WITHOUT CONTRAST TECHNIQUE: Contiguous axial images were obtained from the base of the skull through the vertex without intravenous contrast. COMPARISON:  None. FINDINGS: Brain: No acute territorial infarction, hemorrhage or intracranial mass. Mild atrophy. Nonenlarged ventricles Vascular: No hyperdense vessels.  Carotid vascular calcification Skull: Normal. Negative for fracture or focal lesion. Sinuses/Orbits: No acute finding. Other: None  IMPRESSION: Negative.  No CT evidence for acute intracranial abnormality. Electronically Signed   By: Donavan Foil M.D.   On: 11/18/2020 21:15    Procedures Procedures   Medications Ordered in ED Medications  sodium chloride 0.9 % bolus 1,000 mL (0 mLs Intravenous Stopped 11/18/20 2212)  ondansetron (ZOFRAN) injection 4 mg (4 mg Intravenous Given 11/18/20 2142)    ED Course  I have reviewed the triage vital signs and the nursing notes.  Pertinent labs & imaging results that were available during my care of the patient were reviewed by me and considered in my medical decision making (see chart for details).  Clinical Course as of 11/19/20 1837  Ludwig Clarks Nov 18, 2020  2324 Head CT without acute findings [JK]  2324 Chest x-ray without acute findings [JK]  2325 CBC unremarkable.  Electrolyte panel unremarkable [JK]  Sat Nov 19, 2020  0017 Patient is feeling better.  She was able to ambulate without difficulty. [JK]    Clinical Course User Index [JK] Dorie Rank, MD   MDM Rules/Calculators/A&P                          Pt was sent to the ED for evaluation of dizziness, nausea vomiting.  Considered dehydration, orthostatic hypotension, with her confusion considered stroke, cerebral hemorrhage.  LAbs and CT scans reassuring.  No signs of anemia, AKI.  CT without hemorrhage, stroke.  Pt improved with IV fluids.  Walking without difficulty.  No neuro findings on exam. Doubt stroke.  Stable for discharge Final Clinical Impression(s) / ED Diagnoses Final diagnoses:  Dehydration  Dizziness    Rx / DC Orders ED Discharge Orders          Ordered    ondansetron (ZOFRAN ODT) 8 MG disintegrating tablet  Every 8 hours PRN        11/19/20 0019             Dorie Rank, MD 11/19/20 1839

## 2020-11-19 MED ORDER — ONDANSETRON 8 MG PO TBDP: 8.0000 mg | ORAL_TABLET | Freq: Three times a day (TID) | ORAL | 0 refills | Status: AC | PRN

## 2020-11-19 NOTE — ED Notes (Signed)
Pt has steady gait and able to ambulate 69f without any assistance.

## 2020-11-19 NOTE — Discharge Instructions (Addendum)
Take the medications as needed for nausea.  Follow-up with your doctor to be rechecked.  Return as needed for worsening symptoms

## 2020-11-19 NOTE — ED Notes (Signed)
Pt discharged and wheeled out of the ED without difficulty.

## 2020-11-25 DIAGNOSIS — A084 Viral intestinal infection, unspecified: Secondary | ICD-10-CM | POA: Diagnosis not present

## 2020-11-25 DIAGNOSIS — E860 Dehydration: Secondary | ICD-10-CM | POA: Diagnosis not present

## 2021-01-11 DIAGNOSIS — F321 Major depressive disorder, single episode, moderate: Secondary | ICD-10-CM | POA: Diagnosis not present

## 2021-01-11 DIAGNOSIS — Z0000 Encounter for general adult medical examination without abnormal findings: Secondary | ICD-10-CM | POA: Diagnosis not present

## 2021-01-11 DIAGNOSIS — E1169 Type 2 diabetes mellitus with other specified complication: Secondary | ICD-10-CM | POA: Diagnosis not present

## 2021-01-11 DIAGNOSIS — K7460 Unspecified cirrhosis of liver: Secondary | ICD-10-CM | POA: Diagnosis not present

## 2021-01-11 DIAGNOSIS — E669 Obesity, unspecified: Secondary | ICD-10-CM | POA: Diagnosis not present

## 2021-01-11 DIAGNOSIS — Z23 Encounter for immunization: Secondary | ICD-10-CM | POA: Diagnosis not present

## 2021-01-11 DIAGNOSIS — K7581 Nonalcoholic steatohepatitis (NASH): Secondary | ICD-10-CM | POA: Diagnosis not present

## 2021-01-11 DIAGNOSIS — D649 Anemia, unspecified: Secondary | ICD-10-CM | POA: Diagnosis not present

## 2021-01-11 DIAGNOSIS — I10 Essential (primary) hypertension: Secondary | ICD-10-CM | POA: Diagnosis not present

## 2021-01-11 DIAGNOSIS — E782 Mixed hyperlipidemia: Secondary | ICD-10-CM | POA: Diagnosis not present

## 2021-01-18 ENCOUNTER — Other Ambulatory Visit: Payer: Self-pay | Admitting: Family Medicine

## 2021-01-18 DIAGNOSIS — Z1231 Encounter for screening mammogram for malignant neoplasm of breast: Secondary | ICD-10-CM

## 2021-03-03 ENCOUNTER — Ambulatory Visit
Admission: RE | Admit: 2021-03-03 | Discharge: 2021-03-03 | Disposition: A | Discharge status: Home / Self Care | Payer: Medicare HMO | Source: Ambulatory Visit | Attending: Family Medicine | Admitting: Family Medicine

## 2021-03-03 ENCOUNTER — Other Ambulatory Visit: Payer: Self-pay

## 2021-03-03 DIAGNOSIS — Z1231 Encounter for screening mammogram for malignant neoplasm of breast: Secondary | ICD-10-CM

## 2021-03-28 DIAGNOSIS — R609 Edema, unspecified: Secondary | ICD-10-CM | POA: Diagnosis not present

## 2021-03-28 DIAGNOSIS — K7460 Unspecified cirrhosis of liver: Secondary | ICD-10-CM | POA: Diagnosis not present

## 2021-03-28 DIAGNOSIS — R29898 Other symptoms and signs involving the musculoskeletal system: Secondary | ICD-10-CM | POA: Diagnosis not present

## 2021-03-31 DIAGNOSIS — R600 Localized edema: Secondary | ICD-10-CM | POA: Diagnosis not present

## 2021-03-31 DIAGNOSIS — K7460 Unspecified cirrhosis of liver: Secondary | ICD-10-CM | POA: Diagnosis not present

## 2021-04-07 DIAGNOSIS — R188 Other ascites: Secondary | ICD-10-CM | POA: Diagnosis not present

## 2021-04-07 DIAGNOSIS — K7460 Unspecified cirrhosis of liver: Secondary | ICD-10-CM | POA: Diagnosis not present

## 2021-04-07 DIAGNOSIS — K8020 Calculus of gallbladder without cholecystitis without obstruction: Secondary | ICD-10-CM | POA: Diagnosis not present

## 2021-04-07 DIAGNOSIS — E876 Hypokalemia: Secondary | ICD-10-CM | POA: Diagnosis not present

## 2021-04-14 DIAGNOSIS — R188 Other ascites: Secondary | ICD-10-CM | POA: Diagnosis not present

## 2021-04-14 DIAGNOSIS — E876 Hypokalemia: Secondary | ICD-10-CM | POA: Diagnosis not present

## 2021-04-14 DIAGNOSIS — K7460 Unspecified cirrhosis of liver: Secondary | ICD-10-CM | POA: Diagnosis not present

## 2021-04-21 DIAGNOSIS — K7291 Hepatic failure, unspecified with coma: Secondary | ICD-10-CM | POA: Diagnosis not present

## 2021-04-21 DIAGNOSIS — J90 Pleural effusion, not elsewhere classified: Secondary | ICD-10-CM | POA: Diagnosis not present

## 2021-04-21 DIAGNOSIS — K7469 Other cirrhosis of liver: Secondary | ICD-10-CM | POA: Diagnosis not present

## 2021-04-21 DIAGNOSIS — I8510 Secondary esophageal varices without bleeding: Secondary | ICD-10-CM | POA: Diagnosis not present

## 2021-04-21 DIAGNOSIS — R188 Other ascites: Secondary | ICD-10-CM | POA: Diagnosis not present

## 2021-04-24 ENCOUNTER — Other Ambulatory Visit: Payer: Self-pay | Admitting: Nurse Practitioner

## 2021-04-24 DIAGNOSIS — K7469 Other cirrhosis of liver: Secondary | ICD-10-CM

## 2021-04-27 ENCOUNTER — Other Ambulatory Visit: Payer: Self-pay

## 2021-04-27 ENCOUNTER — Encounter (HOSPITAL_COMMUNITY): Payer: Self-pay | Admitting: *Deleted

## 2021-04-27 ENCOUNTER — Inpatient Hospital Stay (HOSPITAL_COMMUNITY)
Admission: EM | Admit: 2021-04-27 | Discharge: 2021-05-10 | DRG: 870 | Disposition: A | Discharge status: Skilled Nursing Facility | Payer: Medicare HMO | Attending: Internal Medicine | Admitting: Internal Medicine

## 2021-04-27 ENCOUNTER — Emergency Department (HOSPITAL_COMMUNITY): Payer: Medicare HMO

## 2021-04-27 DIAGNOSIS — I700 Atherosclerosis of aorta: Secondary | ICD-10-CM | POA: Diagnosis not present

## 2021-04-27 DIAGNOSIS — K766 Portal hypertension: Secondary | ICD-10-CM | POA: Diagnosis present

## 2021-04-27 DIAGNOSIS — Z66 Do not resuscitate: Secondary | ICD-10-CM | POA: Diagnosis present

## 2021-04-27 DIAGNOSIS — J810 Acute pulmonary edema: Secondary | ICD-10-CM | POA: Diagnosis present

## 2021-04-27 DIAGNOSIS — K7200 Acute and subacute hepatic failure without coma: Secondary | ICD-10-CM | POA: Diagnosis present

## 2021-04-27 DIAGNOSIS — E1165 Type 2 diabetes mellitus with hyperglycemia: Secondary | ICD-10-CM | POA: Diagnosis not present

## 2021-04-27 DIAGNOSIS — I8510 Secondary esophageal varices without bleeding: Secondary | ICD-10-CM | POA: Diagnosis present

## 2021-04-27 DIAGNOSIS — J181 Lobar pneumonia, unspecified organism: Secondary | ICD-10-CM | POA: Diagnosis not present

## 2021-04-27 DIAGNOSIS — R339 Retention of urine, unspecified: Secondary | ICD-10-CM | POA: Diagnosis present

## 2021-04-27 DIAGNOSIS — I82890 Acute embolism and thrombosis of other specified veins: Secondary | ICD-10-CM | POA: Diagnosis present

## 2021-04-27 DIAGNOSIS — N289 Disorder of kidney and ureter, unspecified: Secondary | ICD-10-CM | POA: Diagnosis present

## 2021-04-27 DIAGNOSIS — K7581 Nonalcoholic steatohepatitis (NASH): Secondary | ICD-10-CM | POA: Diagnosis not present

## 2021-04-27 DIAGNOSIS — R0602 Shortness of breath: Secondary | ICD-10-CM | POA: Diagnosis not present

## 2021-04-27 DIAGNOSIS — J9601 Acute respiratory failure with hypoxia: Secondary | ICD-10-CM | POA: Diagnosis not present

## 2021-04-27 DIAGNOSIS — K7682 Hepatic encephalopathy: Secondary | ICD-10-CM | POA: Diagnosis not present

## 2021-04-27 DIAGNOSIS — Z01810 Encounter for preprocedural cardiovascular examination: Secondary | ICD-10-CM | POA: Diagnosis not present

## 2021-04-27 DIAGNOSIS — K7469 Other cirrhosis of liver: Secondary | ICD-10-CM | POA: Diagnosis not present

## 2021-04-27 DIAGNOSIS — Z781 Physical restraint status: Secondary | ICD-10-CM

## 2021-04-27 DIAGNOSIS — Z20822 Contact with and (suspected) exposure to covid-19: Secondary | ICD-10-CM | POA: Diagnosis present

## 2021-04-27 DIAGNOSIS — J90 Pleural effusion, not elsewhere classified: Secondary | ICD-10-CM | POA: Diagnosis not present

## 2021-04-27 DIAGNOSIS — Z48813 Encounter for surgical aftercare following surgery on the respiratory system: Secondary | ICD-10-CM | POA: Diagnosis not present

## 2021-04-27 DIAGNOSIS — Y95 Nosocomial condition: Secondary | ICD-10-CM | POA: Diagnosis present

## 2021-04-27 DIAGNOSIS — R6521 Severe sepsis with septic shock: Secondary | ICD-10-CM | POA: Diagnosis not present

## 2021-04-27 DIAGNOSIS — Z7401 Bed confinement status: Secondary | ICD-10-CM | POA: Diagnosis not present

## 2021-04-27 DIAGNOSIS — D638 Anemia in other chronic diseases classified elsewhere: Secondary | ICD-10-CM | POA: Diagnosis present

## 2021-04-27 DIAGNOSIS — R262 Difficulty in walking, not elsewhere classified: Secondary | ICD-10-CM | POA: Diagnosis not present

## 2021-04-27 DIAGNOSIS — K7290 Hepatic failure, unspecified without coma: Secondary | ICD-10-CM | POA: Diagnosis present

## 2021-04-27 DIAGNOSIS — K7210 Chronic hepatic failure without coma: Secondary | ICD-10-CM | POA: Diagnosis present

## 2021-04-27 DIAGNOSIS — Z9889 Other specified postprocedural states: Secondary | ICD-10-CM

## 2021-04-27 DIAGNOSIS — Z01818 Encounter for other preprocedural examination: Secondary | ICD-10-CM

## 2021-04-27 DIAGNOSIS — I81 Portal vein thrombosis: Secondary | ICD-10-CM | POA: Diagnosis not present

## 2021-04-27 DIAGNOSIS — D684 Acquired coagulation factor deficiency: Secondary | ICD-10-CM | POA: Diagnosis present

## 2021-04-27 DIAGNOSIS — I959 Hypotension, unspecified: Secondary | ICD-10-CM | POA: Diagnosis not present

## 2021-04-27 DIAGNOSIS — J918 Pleural effusion in other conditions classified elsewhere: Secondary | ICD-10-CM | POA: Diagnosis present

## 2021-04-27 DIAGNOSIS — E440 Moderate protein-calorie malnutrition: Secondary | ICD-10-CM | POA: Diagnosis not present

## 2021-04-27 DIAGNOSIS — K7460 Unspecified cirrhosis of liver: Secondary | ICD-10-CM | POA: Diagnosis not present

## 2021-04-27 DIAGNOSIS — G9341 Metabolic encephalopathy: Secondary | ICD-10-CM | POA: Diagnosis not present

## 2021-04-27 DIAGNOSIS — I517 Cardiomegaly: Secondary | ICD-10-CM | POA: Diagnosis not present

## 2021-04-27 DIAGNOSIS — K8020 Calculus of gallbladder without cholecystitis without obstruction: Secondary | ICD-10-CM | POA: Diagnosis not present

## 2021-04-27 DIAGNOSIS — I864 Gastric varices: Secondary | ICD-10-CM | POA: Diagnosis present

## 2021-04-27 DIAGNOSIS — M6259 Muscle wasting and atrophy, not elsewhere classified, multiple sites: Secondary | ICD-10-CM | POA: Diagnosis not present

## 2021-04-27 DIAGNOSIS — D696 Thrombocytopenia, unspecified: Secondary | ICD-10-CM | POA: Diagnosis not present

## 2021-04-27 DIAGNOSIS — Z6841 Body Mass Index (BMI) 40.0 and over, adult: Secondary | ICD-10-CM | POA: Diagnosis not present

## 2021-04-27 DIAGNOSIS — R188 Other ascites: Secondary | ICD-10-CM | POA: Diagnosis present

## 2021-04-27 DIAGNOSIS — Z7189 Other specified counseling: Secondary | ICD-10-CM | POA: Diagnosis not present

## 2021-04-27 DIAGNOSIS — J9691 Respiratory failure, unspecified with hypoxia: Secondary | ICD-10-CM | POA: Diagnosis not present

## 2021-04-27 DIAGNOSIS — I10 Essential (primary) hypertension: Secondary | ICD-10-CM | POA: Diagnosis present

## 2021-04-27 DIAGNOSIS — Z4682 Encounter for fitting and adjustment of non-vascular catheter: Secondary | ICD-10-CM | POA: Diagnosis not present

## 2021-04-27 DIAGNOSIS — E8809 Other disorders of plasma-protein metabolism, not elsewhere classified: Secondary | ICD-10-CM | POA: Diagnosis present

## 2021-04-27 DIAGNOSIS — Z4659 Encounter for fitting and adjustment of other gastrointestinal appliance and device: Secondary | ICD-10-CM

## 2021-04-27 DIAGNOSIS — J948 Other specified pleural conditions: Secondary | ICD-10-CM | POA: Diagnosis not present

## 2021-04-27 DIAGNOSIS — A419 Sepsis, unspecified organism: Secondary | ICD-10-CM | POA: Diagnosis not present

## 2021-04-27 DIAGNOSIS — D6959 Other secondary thrombocytopenia: Secondary | ICD-10-CM | POA: Diagnosis present

## 2021-04-27 DIAGNOSIS — Z79899 Other long term (current) drug therapy: Secondary | ICD-10-CM

## 2021-04-27 DIAGNOSIS — G9340 Encephalopathy, unspecified: Secondary | ICD-10-CM | POA: Diagnosis not present

## 2021-04-27 DIAGNOSIS — E118 Type 2 diabetes mellitus with unspecified complications: Secondary | ICD-10-CM

## 2021-04-27 DIAGNOSIS — Z9911 Dependence on respirator [ventilator] status: Secondary | ICD-10-CM | POA: Diagnosis not present

## 2021-04-27 DIAGNOSIS — I728 Aneurysm of other specified arteries: Secondary | ICD-10-CM | POA: Diagnosis not present

## 2021-04-27 DIAGNOSIS — E785 Hyperlipidemia, unspecified: Secondary | ICD-10-CM | POA: Diagnosis present

## 2021-04-27 DIAGNOSIS — Z8719 Personal history of other diseases of the digestive system: Secondary | ICD-10-CM

## 2021-04-27 LAB — I-STAT CHEM 8, ED
BUN: 21 mg/dL (ref 8–23)
Calcium, Ion: 0.9 mmol/L — ABNORMAL LOW (ref 1.15–1.40)
Chloride: 104 mmol/L (ref 98–111)
Creatinine, Ser: 0.7 mg/dL (ref 0.44–1.00)
Glucose, Bld: 135 mg/dL — ABNORMAL HIGH (ref 70–99)
HCT: 34 % — ABNORMAL LOW (ref 36.0–46.0)
Hemoglobin: 11.6 g/dL — ABNORMAL LOW (ref 12.0–15.0)
Potassium: 3.9 mmol/L (ref 3.5–5.1)
Sodium: 137 mmol/L (ref 135–145)
TCO2: 28 mmol/L (ref 22–32)

## 2021-04-27 LAB — CBC WITH DIFFERENTIAL/PLATELET
Abs Immature Granulocytes: 0.04 10*3/uL (ref 0.00–0.07)
Basophils Absolute: 0.1 10*3/uL (ref 0.0–0.1)
Basophils Relative: 0 %
Eosinophils Absolute: 0.1 10*3/uL (ref 0.0–0.5)
Eosinophils Relative: 1 %
HCT: 34.5 % — ABNORMAL LOW (ref 36.0–46.0)
Hemoglobin: 11.6 g/dL — ABNORMAL LOW (ref 12.0–15.0)
Immature Granulocytes: 0 %
Lymphocytes Relative: 6 %
Lymphs Abs: 0.8 10*3/uL (ref 0.7–4.0)
MCH: 31.4 pg (ref 26.0–34.0)
MCHC: 33.6 g/dL (ref 30.0–36.0)
MCV: 93.5 fL (ref 80.0–100.0)
Monocytes Absolute: 1.1 10*3/uL — ABNORMAL HIGH (ref 0.1–1.0)
Monocytes Relative: 9 %
Neutro Abs: 9.9 10*3/uL — ABNORMAL HIGH (ref 1.7–7.7)
Neutrophils Relative %: 84 %
Platelets: UNDETERMINED 10*3/uL (ref 150–400)
RBC: 3.69 MIL/uL — ABNORMAL LOW (ref 3.87–5.11)
RDW: 16.7 % — ABNORMAL HIGH (ref 11.5–15.5)
WBC: 12 10*3/uL — ABNORMAL HIGH (ref 4.0–10.5)
nRBC: 0 % (ref 0.0–0.2)

## 2021-04-27 LAB — TROPONIN I (HIGH SENSITIVITY)
Troponin I (High Sensitivity): 18 ng/L — ABNORMAL HIGH (ref <18)
Troponin I (High Sensitivity): 16 ng/L (ref <18)

## 2021-04-27 LAB — HEPATIC FUNCTION PANEL
ALT: 42 U/L (ref 0–44)
AST: 63 U/L — ABNORMAL HIGH (ref 15–41)
Albumin: 2.2 g/dL — ABNORMAL LOW (ref 3.5–5.0)
Alkaline Phosphatase: 99 U/L (ref 38–126)
Bilirubin, Direct: 0.6 mg/dL — ABNORMAL HIGH (ref 0.0–0.2)
Indirect Bilirubin: 1.8 mg/dL — ABNORMAL HIGH (ref 0.3–0.9)
Total Bilirubin: 2.4 mg/dL — ABNORMAL HIGH (ref 0.3–1.2)
Total Protein: 6.4 g/dL — ABNORMAL LOW (ref 6.5–8.1)

## 2021-04-27 LAB — BASIC METABOLIC PANEL
Anion gap: 8 (ref 5–15)
BUN: 20 mg/dL (ref 8–23)
CO2: 29 mmol/L (ref 22–32)
Calcium: 8.6 mg/dL — ABNORMAL LOW (ref 8.9–10.3)
Chloride: 102 mmol/L (ref 98–111)
Creatinine, Ser: 0.74 mg/dL (ref 0.44–1.00)
GFR, Estimated: >60 mL/min (ref >60)
Glucose, Bld: 138 mg/dL — ABNORMAL HIGH (ref 70–99)
Potassium: 4 mmol/L (ref 3.5–5.1)
Sodium: 139 mmol/L (ref 135–145)

## 2021-04-27 LAB — PROTIME-INR
INR: 1.6 — ABNORMAL HIGH (ref 0.8–1.2)
Prothrombin Time: 18.8 seconds — ABNORMAL HIGH (ref 11.4–15.2)

## 2021-04-27 LAB — RESP PANEL BY RT-PCR (FLU A&B, COVID) ARPGX2
Influenza A by PCR: NEGATIVE
Influenza B by PCR: NEGATIVE
SARS Coronavirus 2 by RT PCR: NEGATIVE

## 2021-04-27 LAB — AMMONIA: Ammonia: 30 umol/L (ref 9–35)

## 2021-04-27 LAB — BRAIN NATRIURETIC PEPTIDE: B Natriuretic Peptide: 154.8 pg/mL — ABNORMAL HIGH (ref 0.0–100.0)

## 2021-04-27 MED ORDER — RIFAXIMIN 550 MG PO TABS
550.0000 mg | ORAL_TABLET | Freq: Two times a day (BID) | ORAL | Status: DC
  Administered 2021-04-27 – 2021-04-29 (×4): 550 mg via ORAL
  Filled 2021-04-27 (×6): qty 1

## 2021-04-27 MED ORDER — ACETAMINOPHEN 500 MG PO TABS: 1000.0000 mg | ORAL_TABLET | Freq: Four times a day (QID) | ORAL | Status: DC | PRN

## 2021-04-27 MED ORDER — SPIRONOLACTONE 100 MG PO TABS
100.0000 mg | ORAL_TABLET | Freq: Every day | ORAL | Status: DC
  Administered 2021-04-27 – 2021-04-29 (×3): 100 mg via ORAL
  Filled 2021-04-27 (×3): qty 1

## 2021-04-27 MED ORDER — PROPRANOLOL HCL 40 MG PO TABS
40.0000 mg | ORAL_TABLET | Freq: Two times a day (BID) | ORAL | Status: DC
  Administered 2021-04-27 – 2021-04-28 (×3): 40 mg via ORAL
  Filled 2021-04-27: qty 1
  Filled 2021-04-27 (×2): qty 2
  Filled 2021-04-27 (×3): qty 1

## 2021-04-27 MED ORDER — ONDANSETRON HCL 4 MG/2ML IJ SOLN
4.0000 mg | Freq: Four times a day (QID) | INTRAMUSCULAR | Status: DC | PRN
  Administered 2021-05-02 – 2021-05-03 (×2): 4 mg via INTRAVENOUS
  Filled 2021-04-27 (×3): qty 2

## 2021-04-27 MED ORDER — SIMVASTATIN 20 MG PO TABS
40.0000 mg | ORAL_TABLET | Freq: Every day | ORAL | Status: DC
  Administered 2021-04-27 – 2021-04-28 (×2): 40 mg via ORAL
  Filled 2021-04-27 (×3): qty 2

## 2021-04-27 MED ORDER — LACTULOSE 10 GM/15ML PO SOLN
30.0000 g | Freq: Two times a day (BID) | ORAL | Status: DC
  Administered 2021-04-27 – 2021-04-29 (×4): 30 g via ORAL
  Filled 2021-04-27: qty 45
  Filled 2021-04-27: qty 60
  Filled 2021-04-27: qty 45
  Filled 2021-04-27: qty 60

## 2021-04-27 MED ORDER — FERROUS SULFATE 325 (65 FE) MG PO TABS
325.0000 mg | ORAL_TABLET | Freq: Every day | ORAL | Status: DC
  Administered 2021-04-28 – 2021-05-01 (×4): 325 mg via ORAL
  Filled 2021-04-27 (×5): qty 1

## 2021-04-27 MED ORDER — ONDANSETRON 4 MG PO TBDP: 8.0000 mg | ORAL_TABLET | Freq: Three times a day (TID) | ORAL | Status: DC | PRN

## 2021-04-27 MED ORDER — FUROSEMIDE 10 MG/ML IJ SOLN
60.0000 mg | Freq: Two times a day (BID) | INTRAMUSCULAR | Status: DC
  Administered 2021-04-27 – 2021-04-29 (×4): 60 mg via INTRAVENOUS
  Filled 2021-04-27 (×4): qty 6

## 2021-04-27 MED ORDER — LORATADINE 10 MG PO TABS
10.0000 mg | ORAL_TABLET | Freq: Every day | ORAL | Status: DC
  Administered 2021-04-27 – 2021-04-29 (×3): 10 mg via ORAL
  Filled 2021-04-27 (×3): qty 1

## 2021-04-27 MED ORDER — SODIUM CHLORIDE 0.9% FLUSH: 3.0000 mL | INTRAVENOUS | Status: DC | PRN

## 2021-04-27 MED ORDER — ACETAMINOPHEN 325 MG PO TABS: 650.0000 mg | ORAL_TABLET | ORAL | Status: DC | PRN

## 2021-04-27 MED ORDER — PROPRANOLOL HCL 60 MG PO TABS
60.0000 mg | ORAL_TABLET | Freq: Two times a day (BID) | ORAL | Status: DC
Start: 2021-04-27 — End: 2021-04-27

## 2021-04-27 MED ORDER — PHYTONADIONE 5 MG PO TABS
5.0000 mg | ORAL_TABLET | Freq: Once | ORAL | Status: AC
  Administered 2021-04-27: 5 mg via ORAL
  Filled 2021-04-27: qty 1

## 2021-04-27 MED ORDER — SODIUM CHLORIDE 0.9% FLUSH
3.0000 mL | Freq: Two times a day (BID) | INTRAVENOUS | Status: DC
  Administered 2021-04-27 – 2021-05-07 (×18): 3 mL via INTRAVENOUS

## 2021-04-27 MED ORDER — SODIUM CHLORIDE 0.9 % IV SOLN
250.0000 mL | INTRAVENOUS | Status: DC | PRN
  Administered 2021-05-02: 250 mL via INTRAVENOUS

## 2021-04-27 MED ORDER — LISINOPRIL-HYDROCHLOROTHIAZIDE 20-12.5 MG PO TABS: 1.0000 | ORAL_TABLET | Freq: Every day | ORAL | Status: DC

## 2021-04-27 MED ORDER — IOHEXOL 300 MG/ML  SOLN
100.0000 mL | Freq: Once | INTRAMUSCULAR | Status: AC | PRN
  Administered 2021-04-27: 100 mL via INTRAVENOUS

## 2021-04-27 MED ORDER — ADULT MULTIVITAMIN W/MINERALS CH
1.0000 | ORAL_TABLET | Freq: Every day | ORAL | Status: DC
  Administered 2021-04-27 – 2021-04-29 (×3): 1 via ORAL
  Filled 2021-04-27 (×3): qty 1

## 2021-04-27 MED ORDER — PROPRANOLOL HCL 20 MG PO TABS
20.0000 mg | ORAL_TABLET | Freq: Two times a day (BID) | ORAL | Status: DC
Start: 2021-04-27 — End: 2021-04-27

## 2021-04-27 MED ORDER — ALBUMIN HUMAN 25 % IV SOLN
25.0000 g | Freq: Four times a day (QID) | INTRAVENOUS | Status: AC
  Administered 2021-04-27 – 2021-04-28 (×3): 25 g via INTRAVENOUS
  Administered 2021-04-28: 12.5 g via INTRAVENOUS
  Administered 2021-04-28 – 2021-04-29 (×4): 25 g via INTRAVENOUS
  Filled 2021-04-27 (×8): qty 100

## 2021-04-27 NOTE — ED Provider Triage Note (Signed)
Emergency Medicine Provider Triage Evaluation Note  Courtney Braun , a 71 y.o. female  was evaluated in triage.  Pt complains of shortness of breath x1 week, worse for the past 4 to 5 days, worse with exertion, denies orthopnea.  States that she has "twinges" of discomfort in her chest at times but states not frank chest pain. Denies history of chronic lung disease. Recently seen by her hematologist (sees for "elevated liver") who told her that she had fluid in her lungs however has not been able to have a chest x-ray as of yet. Also recent primary care work-up for swelling of her legs for the past few months without cause found.  Review of Systems  Positive: Shortness of breath, wheezing Negative: Fevers  Physical Exam  BP (!) 141/68 (BP Location: Left Arm)    Pulse (!) 112    Temp 97.9 F (36.6 C)    Resp 17    SpO2 94%  Gen:   Awake, no distress , speaks in short sentences  Resp:  Speaks in short sentences, diminished right lower lung field MSK:   Moves extremities without difficulty  Other:  Mild pitting edema to bilateral lower extremities  Medical Decision Making  Medically screening exam initiated at 12:06 PM.  Appropriate orders placed.  Courtney Braun was informed that the remainder of the evaluation will be completed by another provider, this initial triage assessment does not replace that evaluation, and the importance of remaining in the ED until their evaluation is complete.     Tacy Learn, PA-C 04/27/21 1225

## 2021-04-27 NOTE — ED Provider Notes (Signed)
[facility] EMERGENCY DEPARTMENT  Provider Note  CSN: [id number] Arrival date & time: 04/27/21 1136  History Chief Complaint  Patient presents with   Shortness of Breath    Courtney Braun is a 71 y.o. female with history of NASH cirrhosis has had increasing abdominal girth and leg swelling in recent weeks. She has not had any liver imaging in several years but went to the [facility] hepatologist last week and had her diuretics increased without improvement. She has been increasing SOB since then, now cannot walk very far without getting winded. No fever or CP. No nausea or vomiting.    Home Medications Prior to Admission medications   Medication Sig Start Date End Date Taking? Authorizing Provider  acetaminophen (TYLENOL) 500 MG tablet Take 1,000 mg by mouth every 6 (six) hours as needed for moderate pain or headache.    [provider]  cetirizine (ZYRTEC) 10 MG tablet Take 10 mg by mouth daily as needed for allergies.    [provider]  ferrous sulfate 325 (65 FE) MG tablet Take 325 mg by mouth daily with breakfast.    [provider]  lisinopril-hydrochlorothiazide (ZESTORETIC) 20-12.5 MG tablet Take 1 tablet by mouth daily. 08/19/18   [provider]  Multiple Vitamin (MULTIVITAMIN WITH MINERALS) TABS tablet Take 1 tablet by mouth daily.    [provider]  ondansetron (ZOFRAN ODT) 8 MG disintegrating tablet Take 1 tablet (8 mg total) by mouth every 8 (eight) hours as needed for nausea or vomiting. 11/19/20   Dorie Rank, MD  ondansetron (ZOFRAN) 8 MG tablet Take 8 mg by mouth 2 (two) times daily as needed for nausea/vomiting. 05/29/18   [provider]  propranolol (INDERAL) 40 MG tablet Take 20 mg by mouth 2 (two) times daily. 09/12/18   [provider]  simvastatin (ZOCOR) 40 MG tablet Take 40 mg by mouth at bedtime.     [provider]     Allergies    Metformin and related and Voltaren  [diclofenac]   Review of Systems   Review of Systems Please see HPI for pertinent positives and negatives  Physical Exam BP (!) 143/83    Pulse (!) 110    Temp 98.8 F (37.1 C) (Oral)    Resp (!) 23    SpO2 97%   Physical Exam Vitals and nursing note reviewed.  Constitutional:      Appearance: Normal appearance.  HENT:     Head: Normocephalic and atraumatic.     Nose: Nose normal.     Mouth/Throat:     Mouth: Mucous membranes are moist.  Eyes:     Extraocular Movements: Extraocular movements intact.     Conjunctiva/sclera: Conjunctivae normal.  Cardiovascular:     Rate and Rhythm: Normal rate.  Pulmonary:     Effort: Pulmonary effort is normal.     Breath sounds: Decreased breath sounds present.  Abdominal:     General: Abdomen is flat.     Palpations: Abdomen is soft.     Tenderness: There is no abdominal tenderness.  Musculoskeletal:        General: No swelling. Normal range of motion.     Cervical back: Neck supple.     Right lower leg: Edema present.     Left lower leg: Edema present.  Skin:    General: Skin is warm and dry.  Neurological:     General: No focal deficit present.     Mental Status: She is  alert.  Psychiatric:        Mood and Affect: Mood normal.    ED Results / Procedures / Treatments   EKG None  Procedures Procedures  Medications Ordered in the ED Medications  acetaminophen (TYLENOL) tablet 1,000 mg (has no administration in time range)  simvastatin (ZOCOR) tablet 40 mg (has no administration in time range)  ondansetron (ZOFRAN-ODT) disintegrating tablet 8 mg (has no administration in time range)  ferrous sulfate tablet 325 mg (has no administration in time range)  multivitamin with minerals tablet 1 tablet (has no administration in time range)  loratadine (CLARITIN) tablet 10 mg (has no administration in time range)  lactulose (CHRONULAC) 10 GM/15ML solution 30 g (has no administration in time range)  rifaximin (XIFAXAN) tablet 550 mg  (has no administration in time range)  albumin human 25 % solution 25 g (25 g Intravenous New Bag/Given 04/27/21 1851)  sodium chloride flush (NS) 0.9 % injection 3 mL (has no administration in time range)  sodium chloride flush (NS) 0.9 % injection 3 mL (has no administration in time range)  0.9 %  sodium chloride infusion (has no administration in time range)  acetaminophen (TYLENOL) tablet 650 mg (has no administration in time range)  ondansetron (ZOFRAN) injection 4 mg (has no administration in time range)  furosemide (LASIX) injection 60 mg (60 mg Intravenous Given 04/27/21 1843)  spironolactone (ALDACTONE) tablet 100 mg (has no administration in time range)  propranolol (INDERAL) tablet 40 mg (has no administration in time range)  iohexol (OMNIPAQUE) 300 MG/ML solution 100 mL (100 mLs Intravenous Contrast Given 04/27/21 1517)  phytonadione (VITAMIN K) tablet 5 mg (5 mg Oral Given 04/27/21 1819)    Initial Impression and Plan  Patient noted to be hypoxic on my initial evaluation, SpO2 88% on room air at rest, started on 2L [place] with good improvement. Labs and imaging done in triage are reviewed. CBC shows mild leukocytosis and anemia. PLT count not reported due to clumping. BMP is unremarkable, LFTs have been added, INR added. CXR and CT show a large R sided pleural effusion, likely hepatic hydrothorax given her known liver disease. Will discuss management with GI, anticipate admission. Patient is amenable.   ED Course   Clinical Course as of 04/27/21 1934  Thu Apr 27, 2021  1734 Spoke with Dr. Watt Climes, GI who recommends admission for thoracentesis. Dr. Roosevelt Locks, Hospitalist, will evaluate in the ED for admission.  [CS]    Clinical Course User Index [CS] Truddie Hidden, MD     MDM Rules/Calculators/A&P Medical Decision Making Problems Addressed: Cirrhosis of liver with ascites, unspecified hepatic cirrhosis type [facility]): chronic illness or injury with exacerbation, progression, or side effects  of treatment Pleural effusion: acute illness or injury that poses a threat to life or bodily functions  Amount and/or Complexity of Data Reviewed Labs: ordered. Decision-making details documented in ED Course. Radiology: ordered and independent interpretation performed. Decision-making details documented in ED Course.  Risk Decision regarding hospitalization.    Final Clinical Impression(s) / ED Diagnoses Final diagnoses:  Pleural effusion  Cirrhosis of liver with ascites, unspecified hepatic cirrhosis type [facility])    Rx / DC Orders ED Discharge Orders     None        Truddie Hidden, MD 04/27/21 1934

## 2021-04-27 NOTE — ED Triage Notes (Signed)
Pt reports ongoing sob that is getting progressively worse. Has been seen at pcp and had medications adjusted but no relief. Reports swelling to legs. No acute resp distress is noted at triage. Skin w/d.

## 2021-04-27 NOTE — H&P (Signed)
History and Physical    Courtney Braun HQP:[id number] DOB: 12/23/1950 DOA: 04/27/2021  PCP: Mayra Neer, MD (Confirm with patient/family/NH records and if not entered, this has to be entered at [facility] point of entry) Patient coming from: Home  I have personally briefly reviewed patient's old medical records in [place]  Chief Complaint: SOB, belly swelling  HPI: Courtney Braun is a 71 y.o. female with medical history significant of nonalcoholic cirrhosis secondary to NASH, esophageal varices bleeding 2018, HTN, IIDM, HLD, came with increasing SOB and girth.  Symptoms started about one month ago, with new onset of SOB and increasing of abdomen size, for which she went to see her hepatology at [place], who increased her Lasix for 40 mg daily to 60 mg daily. Despite, her symptoms of SOB and increasing girth continued, none of her pants fits her anymore. She also noticed increasing swelling of her legs.  Friend at bedside.  Reported the patient lost her son last year, and since then patient developed bilateral arms and fingers tremors, patient herself considered that related to the stress caused by her son's death.  She denied any fever, chest pain. She has been compliant with her cirrhosis medications including Lactulose.  ED Course: Tachycardia and hypertension no hypoxia.  Patient was found to be fluid overload, CT chest and abdomen showed large right-sided pleural effusion as well as large ascites.  Liver cirrhosis with portal hypertension.  Lab albumin 2.2, creatinine 0.7, WBC 12, INR 1.6, total bilirubin 2.4 direct bilirubin 0.6.  Review of Systems: As per HPI otherwise 14 point review of systems negative.    Past Medical History:  Diagnosis Date   Anemia    Diabetes mellitus without complication ([place])    type II   History of blood transfusion    Hyperlipidemia    Hypertension     Past Surgical History:  Procedure Laterality Date   ESOPHAGOGASTRODUODENOSCOPY N/A  08/06/2016   Procedure: ESOPHAGOGASTRODUODENOSCOPY (EGD);  Surgeon: Clarene Essex, MD;  Location: [facility];  Service: Endoscopy;  Laterality: N/A;   ESOPHAGOGASTRODUODENOSCOPY (EGD) WITH PROPOFOL N/A 05/09/2018   Procedure: ESOPHAGOGASTRODUODENOSCOPY (EGD) WITH PROPOFOL;  Surgeon: Clarene Essex, MD;  Location: [facility];  Service: Endoscopy;  Laterality: N/A;   ESOPHAGOGASTRODUODENOSCOPY (EGD) WITH PROPOFOL N/A 11/04/2018   Procedure: ESOPHAGOGASTRODUODENOSCOPY (EGD) WITH PROPOFOL;  Surgeon: Clarene Essex, MD;  Location: [facility];  Service: Endoscopy;  Laterality: N/A;   GASTRIC VARICES BANDING N/A 05/09/2018   Procedure: GASTRIC VARICES BANDING;  Surgeon: Clarene Essex, MD;  Location: [facility];  Service: Endoscopy;  Laterality: N/A;   none     TONSILLECTOMY       reports that she has never smoked. She has never used smokeless tobacco. She reports current alcohol use. She reports that she does not use drugs.  Allergies  Allergen Reactions   Metformin And Related     Upset stomach   Voltaren [Diclofenac]     diarrhea    Family History  Problem Relation Age of Onset   Hyperlipidemia Other      Prior to Admission medications   Medication Sig Start Date End Date Taking? Authorizing Provider  acetaminophen (TYLENOL) 500 MG tablet Take 1,000 mg by mouth every 6 (six) hours as needed for moderate pain or headache.    [provider]  cetirizine (ZYRTEC) 10 MG tablet Take 10 mg by mouth daily as needed for allergies.    [provider]  ferrous sulfate 325 (65 FE) MG tablet Take 325  mg by mouth daily with breakfast.    [provider]  lisinopril-hydrochlorothiazide (ZESTORETIC) 20-12.5 MG tablet Take 1 tablet by mouth daily. 08/19/18   [provider]  Multiple Vitamin (MULTIVITAMIN WITH MINERALS) TABS tablet Take 1 tablet by mouth daily.    [provider]  ondansetron (ZOFRAN ODT) 8 MG disintegrating tablet Take 1 tablet (8 mg total) by  mouth every 8 (eight) hours as needed for nausea or vomiting. 11/19/20   Dorie Rank, MD  ondansetron (ZOFRAN) 8 MG tablet Take 8 mg by mouth 2 (two) times daily as needed for nausea/vomiting. 05/29/18   [provider]  propranolol (INDERAL) 40 MG tablet Take 20 mg by mouth 2 (two) times daily. 09/12/18   [provider]  simvastatin (ZOCOR) 40 MG tablet Take 40 mg by mouth at bedtime.     [provider]    Physical Exam: Vitals:   04/27/21 1634 04/27/21 1645 04/27/21 1700 04/27/21 1745  BP: (!) 145/66 131/63 (!) 142/63 139/62  Pulse: (!) 108 (!) 105 (!) 101 (!) 106  Resp: (!) 23 (!) 22 (!) 25 (!) 26  Temp: 98.8 F (37.1 C)     TempSrc: Oral     SpO2: (!) 89% 95% 94% 96%    Constitutional: NAD, calm, comfortable Vitals:   04/27/21 1634 04/27/21 1645 04/27/21 1700 04/27/21 1745  BP: (!) 145/66 131/63 (!) 142/63 139/62  Pulse: (!) 108 (!) 105 (!) 101 (!) 106  Resp: (!) 23 (!) 22 (!) 25 (!) 26  Temp: 98.8 F (37.1 C)     TempSrc: Oral     SpO2: (!) 89% 95% 94% 96%   Eyes: PERRL, lids and conjunctivae normal ENMT: Mucous membranes are moist. Posterior pharynx clear of any exudate or lesions.Normal dentition.  Neck: normal, supple, no masses, no thyromegaly Respiratory: Diminished breathing sound on the right compared to the left, no wheezing, no crackles.  Increasing respiratory effort. No accessory muscle use.  Cardiovascular: Regular rate and rhythm, no murmurs / rubs / gallops. 2+ extremity edema. 2+ pedal pulses. No carotid bruits.  Abdomen: no tenderness, no masses palpated. No hepatosplenomegaly. Bowel sounds positive.  Positive ascites signs Musculoskeletal: no clubbing / cyanosis. No joint deformity upper and lower extremities. Good ROM, no contractures. Normal muscle tone.  Flapping like tremors on B/L fore-arms and hands. Skin: no rashes, lesions, ulcers. No induration Neurologic: CN 2-12 grossly intact. Sensation intact, DTR normal. Strength 5/5 in  all 4.  Psychiatric: Normal judgment and insight. Alert and oriented x 3. Normal mood.     Labs on Admission: I have personally reviewed following labs and imaging studies  CBC: Recent Labs  Lab 04/27/21 1229 04/27/21 1255  WBC 12.0*  --   NEUTROABS 9.9*  --   HGB 11.6* 11.6*  HCT 34.5* 34.0*  MCV 93.5  --   PLT PLATELET CLUMPS NOTED ON SMEAR, UNABLE TO ESTIMATE  --    Basic Metabolic Panel: Recent Labs  Lab 04/27/21 1229 04/27/21 1255  NA 139 137  K 4.0 3.9  CL 102 104  CO2 29  --   GLUCOSE 138* 135*  BUN 20 21  CREATININE 0.74 0.70  CALCIUM 8.6*  --    GFR: CrCl cannot be calculated (Unknown ideal weight.). Liver Function Tests: Recent Labs  Lab 04/27/21 1229  AST 63*  ALT 42  ALKPHOS 99  BILITOT 2.4*  PROT 6.4*  ALBUMIN 2.2*   No results for input(s): LIPASE, AMYLASE in the last 168 hours.  No results for input(s): AMMONIA in the last 168 hours. Coagulation Profile: Recent Labs  Lab 04/27/21 1637  INR 1.6*   Cardiac Enzymes: No results for input(s): CKTOTAL, CKMB, CKMBINDEX, TROPONINI in the last 168 hours. BNP (last 3 results) No results for input(s): PROBNP in the last 8760 hours. HbA1C: No results for input(s): HGBA1C in the last 72 hours. CBG: No results for input(s): GLUCAP in the last 168 hours. Lipid Profile: No results for input(s): CHOL, HDL, LDLCALC, TRIG, CHOLHDL, LDLDIRECT in the last 72 hours. Thyroid Function Tests: No results for input(s): TSH, T4TOTAL, FREET4, T3FREE, THYROIDAB in the last 72 hours. Anemia Panel: No results for input(s): VITAMINB12, FOLATE, FERRITIN, TIBC, IRON, RETICCTPCT in the last 72 hours. Urine analysis: No results found for: COLORURINE, APPEARANCEUR, LABSPEC, [place], GLUCOSEU, HGBUR, BILIRUBINUR, KETONESUR, PROTEINUR, UROBILINOGEN, NITRITE, LEUKOCYTESUR  Radiological Exams on Admission: DG Chest 2 View  Result Date: 04/27/2021 CLINICAL DATA:  Shortness of breath EXAM: CHEST - 2 VIEW COMPARISON:   11/18/2020 FINDINGS: Interval development of large right-sided pleural effusion. Hazy opacity within the aerated portion of the right upper lobe. Left lung is clear. No left-sided pleural effusion. No pneumothorax. Grossly stable heart size. Aortic atherosclerosis. IMPRESSION: Interval development of large right-sided pleural effusion. Electronically Signed   By: Davina Poke D.O.   On: 04/27/2021 13:00   CT Chest W Contrast  Result Date: 04/27/2021 CLINICAL DATA:  A 71 year old female presents for evaluation of large RIGHT-sided pleural effusion. EXAM: CT CHEST WITH CONTRAST TECHNIQUE: Multidetector CT imaging of the chest was performed during intravenous contrast administration. RADIATION DOSE REDUCTION: This exam was performed according to the departmental dose-optimization program which includes automated exposure control, adjustment of the mA and/or kV according to patient size and/or use of iterative reconstruction technique. CONTRAST:  164m OMNIPAQUE IOHEXOL 300 MG/ML  SOLN COMPARISON:  CT abdomen and pelvis from 2018. FINDINGS: Cardiovascular: Calcified atheromatous plaque of the thoracic aorta. No aneurysmal dilation. Central pulmonary vasculature is unremarkable on venous phase. Heart size top normal without pericardial effusion. Mediastinum/Nodes: No thoracic inlet, axillary, mediastinal or hilar adenopathy. Esophagus grossly normal. Lungs/Pleura: Large RIGHT-sided pleural effusion with complete collapse of the RIGHT lower lobe and middle lobe and partial collapse of the RIGHT upper lobe. Pleural effusion extends into the lower chest and is sub pulmonic as well as tracking along the dependent chest. Features may be mildly loculated based on appearance in the upper and lower chest as it extends anteriorly. Patchy areas of ground-glass attenuation in the LEFT chest are geographic, some areas measuring up to 4.2 x 3.9 cm. Some more nodular appearing areas (image 37/5) measuring between 20 and 18 mm  when compared to other scattered areas of more discrete nodularity. The airways to the LEFT chest are patent. Mild narrowing of airways to the RIGHT chest without gross endobronchial abnormality of large airways. No signs of dependent pleural nodularity. Most inferior aspect of the pleural space is not imaged as it is distended extending well inferior to what would be expected and inferior to the pleural space in the LEFT chest. Upper Abdomen: Marked hepatic cirrhosis with nodular shrunken hepatic contour. Marked worsening of liver disease compared to the exam of Aug 03, 2016. Cholelithiasis. Gallbladder does not appear distended but is not well assessed due to respiratory motion. Massive perigastric, gastric and moderate-size esophageal varices. Perigastric collateral venous network measuring up to 11 x 6 cm. Gastric varix in the gastric cardia/fundus 2.3 x 2.0 cm can be seen communicating with this large variceal  network in the upper abdomen extending from coronary vein. Large splenorenal shunt. Splenomegaly. Small volume ascites in the abdomen. No acute findings relative to imaged portions of the kidneys and adrenal glands. Question RIGHT colonic thickening. Musculoskeletal: Spinal degenerative changes. No acute musculoskeletal process or destructive bone finding. IMPRESSION: 1. Large RIGHT-sided pleural effusion with near complete collapse of the lung in the RIGHT chest with partial sparing of the RIGHT upper lobe. Findings are likely related to hepatic hydrothorax in this patient with signs of worsening liver disease/cirrhosis and evidence of ascites in the upper abdomen. 2. Marked hepatic cirrhosis with signs of severe portal hypertension and evidence of large perigastric and gastric varices. GI evaluation and interventional radiology consultation may be helpful for further management as warranted. 3. Cholelithiasis, gallbladder not well assessed due to respiratory motion in the upper abdomen. 4. Signs of  potential colonic thickening, correlate with any abdominal symptoms that would indicate portal colopathy. Ultimately abdominal imaging may be helpful for further evaluation in this complex patient. 5. Aortic atherosclerosis 6. Patchy areas of ground-glass attenuation in the LEFT upper lobe most suggestive of infectious or inflammatory changes, suggest 3 month follow-up to ensure resolution Aortic Atherosclerosis (ICD10-I70.0). Electronically Signed   By: Zetta Bills M.D.   On: 04/27/2021 15:30    EKG: Independently reviewed. Sinus tachycardia, no acute ST changes.  Assessment/Plan Principal Problem:   Liver failure (HCC) Active Problems:   Cirrhosis ([place])  (please populate well all problems here in Problem List. (For example, if patient is on BP meds at home and you resume or decide to hold them, it is a problem that needs to be her. Same for CAD, COPD, HLD and so on)  Acute on chronic liver failure -Evidenced by increased ascites, coagulopathy and hypoalbuminemia -MELD=15, with estimated 64-monthmortality 6% and Child-Pugh class C, implying life expectancy 1 to 3 years without liver transplant. -Change p.o. Lasix to IV Lasix 40 mg twice daily -Adjust her blood pressure medications, discontinue ACEI/HCTZ, as patient on Lasix, and Aldactone, and will increase her Adderall for the reason of hypertension and tachycardia. -Treat coagulopathy with vitamin K and recheck level tomorrow -We will give her 2 days infusion of IV albumin to boost diuresis. -Check ammonium level, continue lactulose 30 g 30 for now.  Thoracentesis right-sided -Etiology might be hepatic hydrothorax, ordered IR guided thoracentesis for tomorrow to relieve her breathing symptoms.  Getting albumin infusion.  Worsening of ascites -IR guided paracentesis for tomorrow.  Send cytology. -Given the more recent decompensated ascites, will check AFP.  Bilirubinemia, acute on chronic -Indicating ongoing liver injury, echo GI was  consulted.  HTN -As above  IIDM with hyperglycemia -Fairly controlled.  Tremors -Check ammonia level, on Lactulose rifaximin.  DVT prophylaxis: INR=1.6 Code Status: Full code Family Communication: Close friend at bedside Disposition Plan: Expect more than 2 midnight hospital stay, may need repeated paracentesis. Consults called: [facility], IR Admission status: Tele admit   PLequita HaltMD [facility] Pager [phone] 04/27/2021, 6:25 PM

## 2021-04-28 ENCOUNTER — Inpatient Hospital Stay (HOSPITAL_COMMUNITY): Payer: Medicare HMO

## 2021-04-28 DIAGNOSIS — I10 Essential (primary) hypertension: Secondary | ICD-10-CM

## 2021-04-28 DIAGNOSIS — R188 Other ascites: Secondary | ICD-10-CM | POA: Diagnosis not present

## 2021-04-28 DIAGNOSIS — K766 Portal hypertension: Secondary | ICD-10-CM

## 2021-04-28 DIAGNOSIS — Z8719 Personal history of other diseases of the digestive system: Secondary | ICD-10-CM

## 2021-04-28 HISTORY — PX: IR THORACENTESIS ASP PLEURAL SPACE W/IMG GUIDE: IMG5380

## 2021-04-28 LAB — LACTATE DEHYDROGENASE: LDH: 291 U/L — ABNORMAL HIGH (ref 98–192)

## 2021-04-28 LAB — BASIC METABOLIC PANEL
Anion gap: 8 (ref 5–15)
BUN: 20 mg/dL (ref 8–23)
CO2: 27 mmol/L (ref 22–32)
Calcium: 8.4 mg/dL — ABNORMAL LOW (ref 8.9–10.3)
Chloride: 103 mmol/L (ref 98–111)
Creatinine, Ser: 0.75 mg/dL (ref 0.44–1.00)
GFR, Estimated: >60 mL/min (ref >60)
Glucose, Bld: 121 mg/dL — ABNORMAL HIGH (ref 70–99)
Potassium: 3.9 mmol/L (ref 3.5–5.1)
Sodium: 138 mmol/L (ref 135–145)

## 2021-04-28 LAB — GLUCOSE, PLEURAL OR PERITONEAL FLUID: Glucose, Fluid: 139 mg/dL

## 2021-04-28 LAB — LACTATE DEHYDROGENASE, PLEURAL OR PERITONEAL FLUID: LD, Fluid: 58 U/L — ABNORMAL HIGH (ref 3–23)

## 2021-04-28 LAB — BODY FLUID CELL COUNT WITH DIFFERENTIAL
Eos, Fluid: 0 %
Lymphs, Fluid: 57 %
Monocyte-Macrophage-Serous Fluid: 21 % — ABNORMAL LOW (ref 50–90)
Neutrophil Count, Fluid: 22 % (ref 0–25)
Total Nucleated Cell Count, Fluid: 97 cu mm (ref 0–1000)

## 2021-04-28 LAB — HEMOGLOBIN A1C
Hgb A1c MFr Bld: 4.3 % — ABNORMAL LOW (ref 4.8–5.6)
Mean Plasma Glucose: 76.71 mg/dL

## 2021-04-28 LAB — GRAM STAIN

## 2021-04-28 LAB — PROTEIN, PLEURAL OR PERITONEAL FLUID: Total protein, fluid: <3 g/dL

## 2021-04-28 LAB — PROTIME-INR
INR: 1.7 — ABNORMAL HIGH (ref 0.8–1.2)
Prothrombin Time: 19.7 seconds — ABNORMAL HIGH (ref 11.4–15.2)

## 2021-04-28 LAB — ALBUMIN, PLEURAL OR PERITONEAL FLUID: Albumin, Fluid: <1.5 g/dL

## 2021-04-28 MED ORDER — LIDOCAINE HCL (PF) 1 % IJ SOLN
INTRAMUSCULAR | Status: DC | PRN
  Administered 2021-04-28: 10 mL

## 2021-04-28 NOTE — ED Notes (Signed)
RN setup pt tray in room. Pt is eating her meal.

## 2021-04-28 NOTE — ED Notes (Signed)
Pt was given pericare. Linens and brief changed.

## 2021-04-28 NOTE — Procedures (Signed)
Ultrasound-guided diagnostic and therapeutic right thoracentesis performed yielding 1.5  liters of serosanguinous colored fluid. No immediate complications.   Diagnostic fluid was sent to the lab for further analysis. Follow-up chest x-ray pending. EBL is < 2 ml.

## 2021-04-28 NOTE — ED Notes (Signed)
Niece Marcelino Freestone [phone] would like an update

## 2021-04-28 NOTE — ED Notes (Signed)
Pt was provided peri-care and brief changed.

## 2021-04-28 NOTE — Progress Notes (Signed)
PROGRESS NOTE  Courtney Braun  DOB: 1950/04/24  PCP: Mayra Neer, MD SVX:[id number]  DOA: 04/27/2021  LOS: 1 day  Hospital Day: 2  Brief narrative: Courtney Braun is a 71 y.o. female with PMH significant for nonalcoholic liver cirrhosis secondary to [facility], esophageal varices with history of bleeding in 2018, chronic anemia, DM 2, HTN, HLD.  Patient presented to the ED on 2/9 with complaint of progressive worsening shortness of breath and abdominal distention. Patient follows up with hepatologist at [facility].  Over the last year, but she has had noticed gradual decline in her health since her son passed away last year.  For the last few weeks, patient has noticed progressively worsening swelling of her abdomen and legs.  Recently her Lasix dose was increased from 40 mg to 60 mg daily but only with mild relief. Reports compliance to cirrhosis medicines at home including lactulose, reports about 6-8 bowel movements a day.  In the ED, patient was afebrile, tachycardic to 112, blood pressure 141/68 94% oxygen saturation on room air. BMP with normal sodium, potassium, creatinine, alk phos.  Slightly elevated AST and indirect bilirubin WBC count 12, hemoglobin 11.6 INR 1.7 Ammonia level normal Chest x-ray with large right-sided pleural effusion Admitted to hospital service GI was consulted.  Subjective: Patient was seen and examined this am. Pleasant elderly caucasian female. In mild resp distress on O2. Unable to complete a sentence. Chart reviewed Remains afebrile, heart rate in 80s, blood pressure in 120s,  Assessment/Plan: Active Problems:   Liver cirrhosis secondary to NASH (HCC)   Portal hypertension (HCC)   Ascites   Hx of esophageal varices   Pleural effusion, right   Benign essential HTN   Decompensated nonalcoholic liver cirrhosis Portal hypertension with ascites History of esophageal varices and GI bleeding -Presented with increased ascites, shortness of  breath -Slightly elevated AST, indirect bili, INR -MELD score 16 -Ultrasound-guided paracentesis ordered.  To obtain fluid analysis, -At home, patient was on lactulose, Lasix 20 mg daily, Aldactone 50 mg daily.  It seems patient was not taking propranolol 20 mg twice daily. -Continue IV Lasix, Aldactone, lactulose, Xifaxan Recent Labs  Lab 04/27/21 1229 04/27/21 1637 04/27/21 1815 04/28/21 0329  AST 63*  --   --   --   ALT 42  --   --   --   ALKPHOS 99  --   --   --   BILITOT 2.4*  --   --   --   BILIDIR 0.6*  --   --   --   IBILI 1.8*  --   --   --   PROT 6.4*  --   --   --   ALBUMIN 2.2*  --   --   --   AMMONIA  --   --  30  --   INR  --  1.6*  --  1.7*    MELD-Na score: 16 at 04/28/2021  3:29 AM MELD score: 16 at 04/28/2021  3:29 AM Calculated from: Serum Creatinine: 0.75 mg/dL (Using min of 1 mg/dL) at 04/28/2021  3:29 AM Serum Sodium: 138 mmol/L (Using max of 137 mmol/L) at 04/28/2021  3:29 AM Total Bilirubin: 2.4 mg/dL at 04/27/2021 12:29 PM INR(ratio): 1.7 at 04/28/2021  3:29 AM Age: 64 years  Large right pleural effusion -Ultrasound-guided thoracentesis done. 1.5 lit pleural fluid drained.  Essential hypertension -Blood pressure meds per portal hypertension regimen.   Hx of Type 2 diabetes mellitus -A1C 4.3. -Seems diet controlled.  Mild chronic anemia -Continue iron supplement Recent Labs    11/18/20 1758 04/27/21 1229 04/27/21 1255  HGB 12.3 11.6* 11.6*  MCV 91.6 93.5  --     Mobility: encourage ambulation Goals of care   Code Status: Full Code    Nutritional status:  Body mass index is 33.58 kg/m.      Diet:  Diet Order             Diet Heart Room service appropriate? Yes; Fluid consistency: Thin; Fluid restriction: 2000 mL Fluid  Diet effective now                   DVT prophylaxis:  SCDs Start: 04/27/21 1822   Antimicrobials: none Fluid:  Consultants: GI, IR Family Communication: none at bedside.   Status is:  inpatient  Continue in-hospital care because: needs fluid management.  Level of care: Telemetry Medical   Dispo: The patient is from: home              Anticipated d/c is to: home              Patient currently is not medically stable to d/c.   Difficult to place patient No     Infusions:   sodium chloride     albumin human Stopped (04/28/21 1457)    Scheduled Meds:  ferrous sulfate  325 mg Oral Q breakfast   furosemide  60 mg Intravenous BID   lactulose  30 g Oral BID   loratadine  10 mg Oral Daily   multivitamin with minerals  1 tablet Oral Daily   propranolol  40 mg Oral BID   rifaximin  550 mg Oral BID   simvastatin  40 mg Oral QHS   sodium chloride flush  3 mL Intravenous Q12H   spironolactone  100 mg Oral Daily    PRN meds: sodium chloride, acetaminophen, acetaminophen, lidocaine (PF), ondansetron (ZOFRAN) IV, ondansetron, sodium chloride flush   Antimicrobials: Anti-infectives (From admission, onward)    Start     Dose/Rate Route Frequency Ordered Stop   04/27/21 2200  rifaximin (XIFAXAN) tablet 550 mg        550 mg Oral 2 times daily 04/27/21 1822         Objective: Vitals:   04/28/21 1525 04/28/21 1614  BP: (!) 113/55 (!) 117/56  Pulse: 80 85  Resp: (!) 23 17  Temp: 98.9 F (37.2 C) 98.6 F (37 C)  SpO2: 97% 94%    Intake/Output Summary (Last 24 hours) at 04/28/2021 1834 Last data filed at 04/28/2021 1616 Gross per 24 hour  Intake 194.54 ml  Output 905 ml  Net -710.46 ml   Filed Weights   04/28/21 1607  Weight: 112.3 kg   Weight change:  Body mass index is 33.58 kg/m.   Physical Exam: General exam: pleasant, not in distress Skin: No rashes, lesions or ulcers. HEENT: Atraumatic, normocephalic, no obvious bleeding Lungs: diminished air entry in both bases. CVS: S1, S2, M0 GI/Abd soft, mild diffuse tenderness. CNS: alert, awake, orientedX3 Psychiatry: mood appropriate Extremities: trace b/l pedal edema.  Data Review: I have  personally reviewed the laboratory data and studies available.  F/u labs ordered. Unresulted Labs (From admission, onward)     Start     Ordered   04/28/21 1254  Culture, body fluid w Gram Stain-bottle  Once,   R        04/28/21 1254   04/28/21 5277  Basic metabolic panel  Daily,  R      04/27/21 1822   04/28/21 0500  AFP tumor marker  Tomorrow morning,   R        04/27/21 1823            Signed, Terrilee Croak, MD [facility] 04/28/2021

## 2021-04-28 NOTE — ED Notes (Signed)
Provided pericare and changed brief

## 2021-04-28 NOTE — Consult Note (Addendum)
Referring Provider: [facility] Primary Care Physician:  Mayra Neer, MD Primary Gastroenterologist:  Dr. Watt Climes  Reason for Consultation: Decompensated cirrhosis  HPI: Courtney Braun is a 71 y.o. female medical history significant of nonalcoholic cirrhosis secondary to NASH (MELD score 16), esophageal varices bleeding 2018, HTN, IIDM, HLD presents for evaluation of decompensated cirrhosis  Patient is followed by hepatology at [facility].  Patient states over the last year her health has been gradually declining.  Her son passed away last year and she thinks this may be why her health has been declining.  She states she has been "shaky" for the last year.  Over the last few weeks she has noticed increased swelling of her legs and abdomen. Recently increased her Lasix from 40 Mg daily to 60 Mg daily.  This provided mild relief initially, though she kept having worsening symptoms.  Also reports worsening shortness of breath over the last few weeks which brought her to the ED. She denies previous history of ascites, SBP or needing a paracentesis in the past. Denies fever, though reports intermittent chills.  Compliant with cirrhosis medications. She is on lactulose once daily, has 8 bowel movements daily.  She states she tries to stick to a low-sodium diet, primary care told her she is not intaking enough protein sop she is now on protein shakes.  Denies melena/hematochezia.  Denies abdominal pain.  Denies nausea/vomiting.  Denies blood thinner use or aspirin use.  Denies NSAIDs.  Denies tobacco.  Used alcohol socially up until 2 years ago. Denies family history of liver issues or colon cancer  RUQ ultrasound April 2022 was negative for focal lesions.  Showed single gallstone.  Last colonoscopy 06/2020: Internal hemorrhoids, diverticulosis in sigmoid colon.  Small polyp in the cecum biopsied as benign lymphangioma.  2 diminutive polyps in the sigmoid and ascending colon biopsied as polyploid  mucosa.  EGD 10/2018.  Normal larynx, grade 1 esophageal varices.  Gastric varices without bleeding.  Portal hypertensive gastropathy.  Normal duodenum.  Past Medical History:  Diagnosis Date   Anemia    Diabetes mellitus without complication ([place])    type II   History of blood transfusion    Hyperlipidemia    Hypertension     Past Surgical History:  Procedure Laterality Date   ESOPHAGOGASTRODUODENOSCOPY N/A 08/06/2016   Procedure: ESOPHAGOGASTRODUODENOSCOPY (EGD);  Surgeon: Clarene Essex, MD;  Location: [place];  Service: Endoscopy;  Laterality: N/A;   ESOPHAGOGASTRODUODENOSCOPY (EGD) WITH PROPOFOL N/A 05/09/2018   Procedure: ESOPHAGOGASTRODUODENOSCOPY (EGD) WITH PROPOFOL;  Surgeon: Clarene Essex, MD;  Location: [facility];  Service: Endoscopy;  Laterality: N/A;   ESOPHAGOGASTRODUODENOSCOPY (EGD) WITH PROPOFOL N/A 11/04/2018   Procedure: ESOPHAGOGASTRODUODENOSCOPY (EGD) WITH PROPOFOL;  Surgeon: Clarene Essex, MD;  Location: [facility];  Service: Endoscopy;  Laterality: N/A;   GASTRIC VARICES BANDING N/A 05/09/2018   Procedure: GASTRIC VARICES BANDING;  Surgeon: Clarene Essex, MD;  Location: [facility];  Service: Endoscopy;  Laterality: N/A;   none     TONSILLECTOMY      Prior to Admission medications   Medication Sig Start Date End Date Taking? Authorizing Provider  acetaminophen (TYLENOL) 500 MG tablet Take 1,000 mg by mouth every 6 (six) hours as needed for moderate pain or headache.   Yes [provider]  cetirizine (ZYRTEC) 10 MG tablet Take 10 mg by mouth daily as needed for allergies.   Yes [provider]  ferrous sulfate 325 (65 FE) MG tablet Take 325 mg by mouth daily with breakfast.  Yes [provider]  furosemide (LASIX) 20 MG tablet Take 20 mg by mouth daily. 03/29/21  Yes [provider]  Multiple Vitamin (MULTIVITAMIN WITH MINERALS) TABS tablet Take 1 tablet by mouth daily.   Yes [provider]  ondansetron (ZOFRAN) 8 MG  tablet Take 8 mg by mouth 2 (two) times daily as needed for nausea/vomiting. 05/29/18  Yes [provider]  sertraline (ZOLOFT) 50 MG tablet Take 50 mg by mouth daily.   Yes [provider]  simvastatin (ZOCOR) 40 MG tablet Take 40 mg by mouth at bedtime.    Yes [provider]  spironolactone (ALDACTONE) 50 MG tablet Take 50 mg by mouth daily. 04/21/21  Yes [provider]  lisinopril-hydrochlorothiazide (ZESTORETIC) 20-12.5 MG tablet Take 1 tablet by mouth daily. Patient not taking: Reported on 04/28/2021 08/19/18   [provider]  ondansetron (ZOFRAN ODT) 8 MG disintegrating tablet Take 1 tablet (8 mg total) by mouth every 8 (eight) hours as needed for nausea or vomiting. Patient not taking: Reported on 04/28/2021 11/19/20   Dorie Rank, MD  propranolol (INDERAL) 40 MG tablet Take 20 mg by mouth 2 (two) times daily. Patient not taking: Reported on 04/28/2021 09/12/18   [provider]    Scheduled Meds:  ferrous sulfate  325 mg Oral Q breakfast   furosemide  60 mg Intravenous BID   lactulose  30 g Oral BID   loratadine  10 mg Oral Daily   multivitamin with minerals  1 tablet Oral Daily   propranolol  40 mg Oral BID   rifaximin  550 mg Oral BID   simvastatin  40 mg Oral QHS   sodium chloride flush  3 mL Intravenous Q12H   spironolactone  100 mg Oral Daily   Continuous Infusions:  sodium chloride     albumin human Stopped (04/28/21 0245)   PRN Meds:.sodium chloride, acetaminophen, acetaminophen, ondansetron (ZOFRAN) IV, ondansetron, sodium chloride flush  Allergies as of 04/27/2021 - Review Complete 04/27/2021  Allergen Reaction Noted   Metformin and related  11/18/2020   Voltaren [diclofenac]  11/18/2020    Family History  Problem Relation Age of Onset   Hyperlipidemia Other     Social History   Socioeconomic History   Marital status: Widowed    Spouse name: Not on file   Number of children: Not on file   Years of education:  Not on file   Highest education level: Not on file  Occupational History   Not on file  Tobacco Use   Smoking status: Never   Smokeless tobacco: Never  Vaping Use   Vaping Use: Never used  Substance and Sexual Activity   Alcohol use: Yes    Comment: occasionally   Drug use: No   Sexual activity: Not on file  Other Topics Concern   Not on file  Social History Narrative   Not on file   Social Determinants of Health   Financial Resource Strain: Not on file  Food Insecurity: Not on file  Transportation Needs: Not on file  Physical Activity: Not on file  Stress: Not on file  Social Connections: Not on file  Intimate Partner Violence: Not on file    Review of Systems: Review of Systems  Constitutional:  Positive for chills. Negative for fever and weight loss.  HENT:  Negative for hearing loss and tinnitus.   Eyes:  Negative for blurred vision and double vision.  Respiratory:  Positive for shortness of breath. Negative for hemoptysis.  Cardiovascular:  Negative for chest pain and palpitations.  Gastrointestinal:  Negative for abdominal pain, blood in stool, constipation, diarrhea, heartburn, melena, nausea and vomiting.  Genitourinary:  Negative for dysuria and urgency.  Musculoskeletal:  Negative for myalgias and neck pain.  Skin:  Negative for itching and rash.  Neurological:  Negative for seizures.  Psychiatric/Behavioral:  Negative for substance abuse. The patient is not nervous/anxious.     Physical Exam:Physical Exam Constitutional:      Appearance: She is well-developed. She is ill-appearing.  HENT:     Head: Normocephalic and atraumatic.     Nose: Nose normal. No congestion.     Mouth/Throat:     Mouth: Mucous membranes are moist.     Pharynx: Oropharynx is clear.  Eyes:     General: No scleral icterus.    Extraocular Movements: Extraocular movements intact.     Conjunctiva/sclera: Conjunctivae normal.  Cardiovascular:     Rate and Rhythm: Normal rate and  regular rhythm.  Pulmonary:     Breath sounds: Normal breath sounds.     Comments: Increased respiratory effort, on O2 via [place] Abdominal:     General: Bowel sounds are normal. There is distension.     Palpations: There is no mass.     Tenderness: There is no abdominal tenderness. There is no guarding or rebound.     Hernia: No hernia is present.  Musculoskeletal:     Cervical back: Normal range of motion and neck supple.     Right lower leg: Edema present.     Left lower leg: Edema present.     Comments: 1+ pitting edema bilaterally  Skin:    General: Skin is warm and dry.     Coloration: Skin is not jaundiced.  Neurological:     General: No focal deficit present.     Mental Status: She is alert and oriented to person, place, and time.     Comments: Bilateral asterisix  Psychiatric:        Mood and Affect: Mood normal.        Behavior: Behavior normal.        Thought Content: Thought content normal.        Judgment: Judgment normal.    Vital signs: Vitals:   04/28/21 0600 04/28/21 0700  BP: 117/62 127/67  Pulse: 81 85  Resp: (!) 23 (!) 33  Temp:    SpO2: 96% 92%        GI:  Lab Results: Recent Labs    04/27/21 1229 04/27/21 1255  WBC 12.0*  --   HGB 11.6* 11.6*  HCT 34.5* 34.0*  PLT PLATELET CLUMPS NOTED ON SMEAR, UNABLE TO ESTIMATE  --    BMET Recent Labs    04/27/21 1229 04/27/21 1255 04/28/21 0329  NA 139 137 138  K 4.0 3.9 3.9  CL 102 104 103  CO2 29  --  27  GLUCOSE 138* 135* 121*  BUN 20 21 20   CREATININE 0.74 0.70 0.75  CALCIUM 8.6*  --  8.4*   LFT Recent Labs    04/27/21 1229  PROT 6.4*  ALBUMIN 2.2*  AST 63*  ALT 42  ALKPHOS 99  BILITOT 2.4*  BILIDIR 0.6*  IBILI 1.8*   PT/INR Recent Labs    04/27/21 1637 04/28/21 0329  LABPROT 18.8* 19.7*  INR 1.6* 1.7*     Studies/Results: DG Chest 2 View  Result Date: 04/27/2021 CLINICAL DATA:  Shortness of breath EXAM: CHEST - 2 VIEW COMPARISON:  11/18/2020 FINDINGS: Interval  development of large right-sided pleural effusion. Hazy opacity within the aerated portion of the right upper lobe. Left lung is clear. No left-sided pleural effusion. No pneumothorax. Grossly stable heart size. Aortic atherosclerosis. IMPRESSION: Interval development of large right-sided pleural effusion. Electronically Signed   By: Davina Poke D.O.   On: 04/27/2021 13:00   CT Chest W Contrast  Result Date: 04/27/2021 CLINICAL DATA:  A 71 year old female presents for evaluation of large RIGHT-sided pleural effusion. EXAM: CT CHEST WITH CONTRAST TECHNIQUE: Multidetector CT imaging of the chest was performed during intravenous contrast administration. RADIATION DOSE REDUCTION: This exam was performed according to the departmental dose-optimization program which includes automated exposure control, adjustment of the mA and/or kV according to patient size and/or use of iterative reconstruction technique. CONTRAST:  130m OMNIPAQUE IOHEXOL 300 MG/ML  SOLN COMPARISON:  CT abdomen and pelvis from 2018. FINDINGS: Cardiovascular: Calcified atheromatous plaque of the thoracic aorta. No aneurysmal dilation. Central pulmonary vasculature is unremarkable on venous phase. Heart size top normal without pericardial effusion. Mediastinum/Nodes: No thoracic inlet, axillary, mediastinal or hilar adenopathy. Esophagus grossly normal. Lungs/Pleura: Large RIGHT-sided pleural effusion with complete collapse of the RIGHT lower lobe and middle lobe and partial collapse of the RIGHT upper lobe. Pleural effusion extends into the lower chest and is sub pulmonic as well as tracking along the dependent chest. Features may be mildly loculated based on appearance in the upper and lower chest as it extends anteriorly. Patchy areas of ground-glass attenuation in the LEFT chest are geographic, some areas measuring up to 4.2 x 3.9 cm. Some more nodular appearing areas (image 37/5) measuring between 20 and 18 mm when compared to other  scattered areas of more discrete nodularity. The airways to the LEFT chest are patent. Mild narrowing of airways to the RIGHT chest without gross endobronchial abnormality of large airways. No signs of dependent pleural nodularity. Most inferior aspect of the pleural space is not imaged as it is distended extending well inferior to what would be expected and inferior to the pleural space in the LEFT chest. Upper Abdomen: Marked hepatic cirrhosis with nodular shrunken hepatic contour. Marked worsening of liver disease compared to the exam of Aug 03, 2016. Cholelithiasis. Gallbladder does not appear distended but is not well assessed due to respiratory motion. Massive perigastric, gastric and moderate-size esophageal varices. Perigastric collateral venous network measuring up to 11 x 6 cm. Gastric varix in the gastric cardia/fundus 2.3 x 2.0 cm can be seen communicating with this large variceal network in the upper abdomen extending from coronary vein. Large splenorenal shunt. Splenomegaly. Small volume ascites in the abdomen. No acute findings relative to imaged portions of the kidneys and adrenal glands. Question RIGHT colonic thickening. Musculoskeletal: Spinal degenerative changes. No acute musculoskeletal process or destructive bone finding. IMPRESSION: 1. Large RIGHT-sided pleural effusion with near complete collapse of the lung in the RIGHT chest with partial sparing of the RIGHT upper lobe. Findings are likely related to hepatic hydrothorax in this patient with signs of worsening liver disease/cirrhosis and evidence of ascites in the upper abdomen. 2. Marked hepatic cirrhosis with signs of severe portal hypertension and evidence of large perigastric and gastric varices. GI evaluation and interventional radiology consultation may be helpful for further management as warranted. 3. Cholelithiasis, gallbladder not well assessed due to respiratory motion in the upper abdomen. 4. Signs of potential colonic  thickening, correlate with any abdominal symptoms that would indicate portal colopathy. Ultimately abdominal imaging may be helpful for further evaluation  in this complex patient. 5. Aortic atherosclerosis 6. Patchy areas of ground-glass attenuation in the LEFT upper lobe most suggestive of infectious or inflammatory changes, suggest 3 month follow-up to ensure resolution Aortic Atherosclerosis (ICD10-I70.0). Electronically Signed   By: Zetta Bills M.D.   On: 04/27/2021 15:30    Impression: Decompensated cirrhosis: hepatic encephalopathy, ascites; nonalcoholic cirrhosis - increased ascites, coagulopathy, hypoalbuminemia -AFP tumor marker pending -INR 1.7 -AST 63/ ALT 42/ alk phos 95 -T. bili 2.4 -Hgb 11.6, platelets 82 -Leukocytosis with WBC 12.0 -Normal ammonia levels -BUN 20, creatinine 0.75 -  MELD 16  Right-sided thoracentesis -Receiving albumin infusion, IR guided thoracentesis planned.  Plan: IR guided diagnostic paracentesis planned for today with fluid analysis: cell count with differential, cytology, Gram stain, glucose, LDH, fluid culture, albumin, amylase, bilirubin.  We will follow-up on results. Continue IV Lasix 40 mg twice daily, and spironolactone 100 mg daily Continue lactulose 30 G twice daily, titrate to 2-3 bowel movements daily. Continue Xifaxan 550 mg twice daily Upon discharge, she will be due for EGD screening for varices. This can be done as an outpatient since no current signs of GI bleed. Continue supportive care [facility] will follow    LOS: 1 day   Garnette Scheuermann  PA-C 04/28/2021, 7:51 AM  Contact #  [phone]

## 2021-04-29 ENCOUNTER — Inpatient Hospital Stay (HOSPITAL_COMMUNITY): Payer: Medicare HMO

## 2021-04-29 DIAGNOSIS — K7581 Nonalcoholic steatohepatitis (NASH): Secondary | ICD-10-CM | POA: Diagnosis not present

## 2021-04-29 DIAGNOSIS — J9601 Acute respiratory failure with hypoxia: Secondary | ICD-10-CM | POA: Diagnosis not present

## 2021-04-29 DIAGNOSIS — R188 Other ascites: Secondary | ICD-10-CM | POA: Diagnosis not present

## 2021-04-29 DIAGNOSIS — K7460 Unspecified cirrhosis of liver: Secondary | ICD-10-CM

## 2021-04-29 DIAGNOSIS — Z9911 Dependence on respirator [ventilator] status: Secondary | ICD-10-CM | POA: Diagnosis not present

## 2021-04-29 LAB — PHOSPHORUS: Phosphorus: 4.1 mg/dL (ref 2.5–4.6)

## 2021-04-29 LAB — CBC WITH DIFFERENTIAL/PLATELET
Abs Immature Granulocytes: 0.09 10*3/uL — ABNORMAL HIGH (ref 0.00–0.07)
Basophils Absolute: 0.1 10*3/uL (ref 0.0–0.1)
Basophils Relative: 0 %
Eosinophils Absolute: 0.1 10*3/uL (ref 0.0–0.5)
Eosinophils Relative: 1 %
HCT: 30.9 % — ABNORMAL LOW (ref 36.0–46.0)
Hemoglobin: 9.8 g/dL — ABNORMAL LOW (ref 12.0–15.0)
Immature Granulocytes: 1 %
Lymphocytes Relative: 6 %
Lymphs Abs: 1 10*3/uL (ref 0.7–4.0)
MCH: 30.6 pg (ref 26.0–34.0)
MCHC: 31.7 g/dL (ref 30.0–36.0)
MCV: 96.6 fL (ref 80.0–100.0)
Monocytes Absolute: 1.6 10*3/uL — ABNORMAL HIGH (ref 0.1–1.0)
Monocytes Relative: 10 %
Neutro Abs: 14 10*3/uL — ABNORMAL HIGH (ref 1.7–7.7)
Neutrophils Relative %: 82 %
Platelets: 114 10*3/uL — ABNORMAL LOW (ref 150–400)
RBC: 3.2 MIL/uL — ABNORMAL LOW (ref 3.87–5.11)
RDW: 16.7 % — ABNORMAL HIGH (ref 11.5–15.5)
WBC: 16.9 10*3/uL — ABNORMAL HIGH (ref 4.0–10.5)
nRBC: 0 % (ref 0.0–0.2)

## 2021-04-29 LAB — BASIC METABOLIC PANEL
Anion gap: 9 (ref 5–15)
BUN: 26 mg/dL — ABNORMAL HIGH (ref 8–23)
CO2: 26 mmol/L (ref 22–32)
Calcium: 8.8 mg/dL — ABNORMAL LOW (ref 8.9–10.3)
Chloride: 104 mmol/L (ref 98–111)
Creatinine, Ser: 0.81 mg/dL (ref 0.44–1.00)
GFR, Estimated: >60 mL/min (ref >60)
Glucose, Bld: 116 mg/dL — ABNORMAL HIGH (ref 70–99)
Potassium: 4.1 mmol/L (ref 3.5–5.1)
Sodium: 139 mmol/L (ref 135–145)

## 2021-04-29 LAB — POCT I-STAT 7, (LYTES, BLD GAS, ICA,H+H)
Acid-Base Excess: 6 mmol/L — ABNORMAL HIGH (ref 0.0–2.0)
Bicarbonate: 33.3 mmol/L — ABNORMAL HIGH (ref 20.0–28.0)
Calcium, Ion: 1.28 mmol/L (ref 1.15–1.40)
HCT: 28 % — ABNORMAL LOW (ref 36.0–46.0)
Hemoglobin: 9.5 g/dL — ABNORMAL LOW (ref 12.0–15.0)
O2 Saturation: 100 %
Patient temperature: 98.2
Potassium: 4 mmol/L (ref 3.5–5.1)
Sodium: 142 mmol/L (ref 135–145)
TCO2: 35 mmol/L — ABNORMAL HIGH (ref 22–32)
pCO2 arterial: 64.2 mmHg — ABNORMAL HIGH (ref 32.0–48.0)
pH, Arterial: 7.322 — ABNORMAL LOW (ref 7.350–7.450)
pO2, Arterial: 230 mmHg — ABNORMAL HIGH (ref 83.0–108.0)

## 2021-04-29 LAB — D-DIMER, QUANTITATIVE: D-Dimer, Quant: 15.04 ug/mL-FEU — ABNORMAL HIGH (ref 0.00–0.50)

## 2021-04-29 LAB — MAGNESIUM: Magnesium: 1.8 mg/dL (ref 1.7–2.4)

## 2021-04-29 LAB — PROTIME-INR
INR: 2.1 — ABNORMAL HIGH (ref 0.8–1.2)
Prothrombin Time: 23.8 seconds — ABNORMAL HIGH (ref 11.4–15.2)

## 2021-04-29 LAB — AFP TUMOR MARKER: AFP, Serum, Tumor Marker: <1.8 ng/mL (ref 0.0–9.2)

## 2021-04-29 LAB — GLUCOSE, CAPILLARY
Glucose-Capillary: 124 mg/dL — ABNORMAL HIGH (ref 70–99)
Glucose-Capillary: 120 mg/dL — ABNORMAL HIGH (ref 70–99)
Glucose-Capillary: 146 mg/dL — ABNORMAL HIGH (ref 70–99)

## 2021-04-29 LAB — MRSA NEXT GEN BY PCR, NASAL: MRSA by PCR Next Gen: DETECTED — AB

## 2021-04-29 LAB — PROCALCITONIN: Procalcitonin: 0.24 ng/mL

## 2021-04-29 LAB — LACTIC ACID, PLASMA: Lactic Acid, Venous: 1.6 mmol/L (ref 0.5–1.9)

## 2021-04-29 LAB — BRAIN NATRIURETIC PEPTIDE: B Natriuretic Peptide: 1050.6 pg/mL — ABNORMAL HIGH (ref 0.0–100.0)

## 2021-04-29 MED ORDER — MIDAZOLAM HCL 2 MG/2ML IJ SOLN: 2.0000 mg | Freq: Once | INTRAMUSCULAR | Status: AC

## 2021-04-29 MED ORDER — VANCOMYCIN HCL 10 G IV SOLR
2500.0000 mg | Freq: Once | INTRAVENOUS | Status: AC
  Administered 2021-04-29: 2500 mg via INTRAVENOUS
  Filled 2021-04-29: qty 2500

## 2021-04-29 MED ORDER — VANCOMYCIN HCL 1750 MG/350ML IV SOLN: 1750.0000 mg | INTRAVENOUS | Status: DC

## 2021-04-29 MED ORDER — FENTANYL CITRATE (PF) 100 MCG/2ML IJ SOLN
INTRAMUSCULAR | Status: AC
  Administered 2021-04-29: 100 ug via INTRAVENOUS
  Filled 2021-04-29: qty 2

## 2021-04-29 MED ORDER — MUPIROCIN 2 % EX OINT
1.0000 "application " | TOPICAL_OINTMENT | Freq: Two times a day (BID) | CUTANEOUS | Status: AC
  Administered 2021-04-29 – 2021-05-04 (×10): 1 via NASAL
  Filled 2021-04-29 (×3): qty 22

## 2021-04-29 MED ORDER — LORAZEPAM 2 MG/ML IJ SOLN
INTRAMUSCULAR | Status: AC
  Administered 2021-04-29: 1 mg via INTRAVENOUS
  Filled 2021-04-29: qty 1

## 2021-04-29 MED ORDER — FENTANYL BOLUS VIA INFUSION
25.0000 ug | INTRAVENOUS | Status: DC | PRN
  Administered 2021-04-29: 50 ug via INTRAVENOUS
  Administered 2021-04-29 – 2021-04-30 (×7): 100 ug via INTRAVENOUS
  Filled 2021-04-29: qty 100

## 2021-04-29 MED ORDER — SODIUM CHLORIDE 0.9 % IV SOLN
250.0000 mL | INTRAVENOUS | Status: DC
  Administered 2021-04-29 – 2021-05-04 (×2): 250 mL via INTRAVENOUS

## 2021-04-29 MED ORDER — PROPRANOLOL HCL 40 MG PO TABS
40.0000 mg | ORAL_TABLET | Freq: Two times a day (BID) | ORAL | Status: DC
  Filled 2021-04-29 (×2): qty 1

## 2021-04-29 MED ORDER — LIDOCAINE HCL (PF) 1 % IJ SOLN
INTRAMUSCULAR | Status: AC
  Filled 2021-04-29: qty 30

## 2021-04-29 MED ORDER — SPIRONOLACTONE 100 MG PO TABS
100.0000 mg | ORAL_TABLET | Freq: Every day | ORAL | Status: DC
  Administered 2021-04-30 – 2021-05-03 (×4): 100 mg
  Filled 2021-04-29 (×6): qty 1

## 2021-04-29 MED ORDER — FENTANYL 2500MCG IN NS 250ML (10MCG/ML) PREMIX INFUSION
25.0000 ug/h | INTRAVENOUS | Status: DC
  Administered 2021-04-29: 50 ug/h via INTRAVENOUS
  Administered 2021-04-30 (×2): 175 ug/h via INTRAVENOUS
  Administered 2021-05-01: 125 ug/h via INTRAVENOUS
  Filled 2021-04-29 (×4): qty 250

## 2021-04-29 MED ORDER — SODIUM CHLORIDE 0.9 % IV SOLN
1.0000 g | INTRAVENOUS | Status: DC
  Filled 2021-04-29: qty 10

## 2021-04-29 MED ORDER — ALBUTEROL SULFATE (2.5 MG/3ML) 0.083% IN NEBU: 2.5000 mg | INHALATION_SOLUTION | RESPIRATORY_TRACT | Status: DC | PRN

## 2021-04-29 MED ORDER — VITAL HIGH PROTEIN PO LIQD
1000.0000 mL | ORAL | Status: DC
  Administered 2021-04-29 – 2021-04-30 (×2): 1000 mL

## 2021-04-29 MED ORDER — IPRATROPIUM-ALBUTEROL 0.5-2.5 (3) MG/3ML IN SOLN: 3.0000 mL | Freq: Four times a day (QID) | RESPIRATORY_TRACT | Status: DC | PRN

## 2021-04-29 MED ORDER — MIDAZOLAM HCL 2 MG/2ML IJ SOLN
1.0000 mg | INTRAMUSCULAR | Status: DC | PRN
  Administered 2021-04-29: 1 mg via INTRAVENOUS
  Filled 2021-04-29: qty 2

## 2021-04-29 MED ORDER — RIFAXIMIN 550 MG PO TABS
550.0000 mg | ORAL_TABLET | Freq: Two times a day (BID) | ORAL | Status: DC
  Administered 2021-04-29 – 2021-05-01 (×4): 550 mg
  Filled 2021-04-29 (×4): qty 1

## 2021-04-29 MED ORDER — ETOMIDATE 2 MG/ML IV SOLN: 20.0000 mg | Freq: Once | INTRAVENOUS | Status: AC

## 2021-04-29 MED ORDER — NOREPINEPHRINE 4 MG/250ML-% IV SOLN
2.0000 ug/min | INTRAVENOUS | Status: DC
  Administered 2021-04-30: 5 ug/min via INTRAVENOUS
  Administered 2021-05-02: 09:00:00 3 ug/min via INTRAVENOUS
  Administered 2021-05-03: 2 ug/min via INTRAVENOUS
  Filled 2021-04-29 (×4): qty 250

## 2021-04-29 MED ORDER — LORAZEPAM 2 MG/ML IJ SOLN: 1.0000 mg | Freq: Once | INTRAMUSCULAR | Status: AC

## 2021-04-29 MED ORDER — FUROSEMIDE 10 MG/ML IJ SOLN
40.0000 mg | Freq: Two times a day (BID) | INTRAMUSCULAR | Status: DC
  Administered 2021-04-30 – 2021-05-07 (×16): 40 mg via INTRAVENOUS
  Filled 2021-04-29 (×16): qty 4

## 2021-04-29 MED ORDER — ETOMIDATE 2 MG/ML IV SOLN
INTRAVENOUS | Status: AC
  Administered 2021-04-29: 20 mg via INTRAVENOUS
  Filled 2021-04-29: qty 20

## 2021-04-29 MED ORDER — MIDAZOLAM HCL 2 MG/2ML IJ SOLN
INTRAMUSCULAR | Status: AC
  Administered 2021-04-29: 2 mg via INTRAVENOUS
  Filled 2021-04-29: qty 2

## 2021-04-29 MED ORDER — ORAL CARE MOUTH RINSE
15.0000 mL | OROMUCOSAL | Status: DC
  Administered 2021-04-29 – 2021-05-04 (×45): 15 mL via OROMUCOSAL

## 2021-04-29 MED ORDER — NOREPINEPHRINE 4 MG/250ML-% IV SOLN
INTRAVENOUS | Status: AC
  Administered 2021-04-29: 2 ug/min via INTRAVENOUS
  Filled 2021-04-29: qty 250

## 2021-04-29 MED ORDER — FENTANYL CITRATE (PF) 100 MCG/2ML IJ SOLN: 100.0000 ug | Freq: Once | INTRAMUSCULAR | Status: AC

## 2021-04-29 MED ORDER — CHLORHEXIDINE GLUCONATE CLOTH 2 % EX PADS: 6.0000 | MEDICATED_PAD | Freq: Every day | CUTANEOUS | Status: DC

## 2021-04-29 MED ORDER — DOCUSATE SODIUM 50 MG/5ML PO LIQD: 100.0000 mg | Freq: Two times a day (BID) | ORAL | Status: DC

## 2021-04-29 MED ORDER — ROCURONIUM BROMIDE 10 MG/ML (PF) SYRINGE
PREFILLED_SYRINGE | INTRAVENOUS | Status: AC
  Filled 2021-04-29: qty 10

## 2021-04-29 MED ORDER — CHLORHEXIDINE GLUCONATE 0.12% ORAL RINSE (MEDLINE KIT)
15.0000 mL | Freq: Two times a day (BID) | OROMUCOSAL | Status: DC
  Administered 2021-04-29 – 2021-05-07 (×15): 15 mL via OROMUCOSAL

## 2021-04-29 MED ORDER — FUROSEMIDE 10 MG/ML IJ SOLN
40.0000 mg | Freq: Once | INTRAMUSCULAR | Status: AC
  Administered 2021-04-29: 40 mg via INTRAVENOUS
  Filled 2021-04-29: qty 4

## 2021-04-29 MED ORDER — CHLORHEXIDINE GLUCONATE CLOTH 2 % EX PADS
6.0000 | MEDICATED_PAD | Freq: Every day | CUTANEOUS | Status: DC
  Administered 2021-04-30 – 2021-05-07 (×8): 6 via TOPICAL

## 2021-04-29 MED ORDER — MIDAZOLAM HCL 2 MG/2ML IJ SOLN
1.0000 mg | INTRAMUSCULAR | Status: DC | PRN
  Administered 2021-04-30: 1 mg via INTRAVENOUS
  Filled 2021-04-29: qty 2

## 2021-04-29 MED ORDER — LACTULOSE 10 GM/15ML PO SOLN
30.0000 g | Freq: Two times a day (BID) | ORAL | Status: DC
  Administered 2021-04-29 – 2021-05-03 (×8): 30 g
  Filled 2021-04-29 (×8): qty 45

## 2021-04-29 MED ORDER — ADULT MULTIVITAMIN W/MINERALS CH
1.0000 | ORAL_TABLET | Freq: Every day | ORAL | Status: DC
  Administered 2021-04-30 – 2021-05-03 (×4): 1
  Filled 2021-04-29 (×4): qty 1

## 2021-04-29 MED ORDER — PROSOURCE TF PO LIQD
45.0000 mL | Freq: Two times a day (BID) | ORAL | Status: DC
  Administered 2021-04-29 – 2021-05-01 (×4): 45 mL
  Filled 2021-04-29 (×4): qty 45

## 2021-04-29 MED ORDER — FENTANYL CITRATE (PF) 100 MCG/2ML IJ SOLN: 25.0000 ug | Freq: Once | INTRAMUSCULAR | Status: AC

## 2021-04-29 MED ORDER — POLYETHYLENE GLYCOL 3350 17 G PO PACK
17.0000 g | PACK | Freq: Every day | ORAL | Status: DC
  Administered 2021-04-29: 17 g
  Filled 2021-04-29: qty 1

## 2021-04-29 MED ORDER — SIMVASTATIN 20 MG PO TABS
40.0000 mg | ORAL_TABLET | Freq: Every day | ORAL | Status: DC
  Administered 2021-04-29 – 2021-05-03 (×5): 40 mg
  Filled 2021-04-29 (×6): qty 2

## 2021-04-29 MED ORDER — SODIUM CHLORIDE 0.9 % IV SOLN
2.0000 g | Freq: Three times a day (TID) | INTRAVENOUS | Status: DC
  Administered 2021-04-29 – 2021-04-30 (×3): 2 g via INTRAVENOUS
  Filled 2021-04-29 (×4): qty 2

## 2021-04-29 NOTE — Progress Notes (Signed)
Subjective: Patient complains of minimal improvement in shortness of breath. She had a thoracocentesis, 1.55 L right pleural effusion drainage, however does not have significant ascitic fluid on ultrasound and could not have paracentesis.  Objective: Vital signs in last 24 hours: Temp:  [98.3 F (36.8 C)-98.9 F (37.2 C)] 98.4 F (36.9 C) (02/11 0939) Pulse Rate:  [79-86] 86 (02/11 0939) Resp:  [16-31] 18 (02/11 0939) BP: (104-131)/(52-67) 119/60 (02/11 0939) SpO2:  [90 %-97 %] 96 % (02/11 0939) Weight:  [112.3 kg] 112.3 kg (02/10 1607) Weight change:  Last BM Date: 04/28/21  PE: Mild icterus, mild pallor GENERAL: On oxygen via nasal cannula, able to speak in few words not full sentences, otherwise not using accessory muscles of respiration  ABDOMEN: Soft, obese, nondistended, nontender, normoactive bowel sounds EXTREMITIES: No edema  Lab Results: Results for orders placed or performed during the hospital encounter of 04/27/21 (from the past 48 hour(s))  Basic metabolic panel     Status: Abnormal   Collection Time: 04/27/21 12:29 PM  Result Value Ref Range   Sodium 139 135 - 145 mmol/L   Potassium 4.0 3.5 - 5.1 mmol/L   Chloride 102 98 - 111 mmol/L   CO2 29 22 - 32 mmol/L   Glucose, Bld 138 (H) 70 - 99 mg/dL    Comment: Glucose reference range applies only to samples taken after fasting for at least 8 hours.   BUN 20 8 - 23 mg/dL   Creatinine, Ser 0.74 0.44 - 1.00 mg/dL   Calcium 8.6 (L) 8.9 - 10.3 mg/dL   GFR, Estimated >60 >60 mL/min    Comment: (NOTE) Calculated using the CKD-EPI Creatinine Equation (2021)    Anion gap 8 5 - 15    Comment: Performed at [facility] [address], [place], [place] [address]  CBC with Differential     Status: Abnormal   Collection Time: 04/27/21 12:29 PM  Result Value Ref Range   WBC 12.0 (H) 4.0 - 10.5 K/uL   RBC 3.69 (L) 3.87 - 5.11 MIL/uL   Hemoglobin 11.6 (L) 12.0 - 15.0 g/dL   HCT 34.5 (L) 36.0 - 46.0 %   MCV 93.5 80.0  - 100.0 fL   MCH 31.4 26.0 - 34.0 pg   MCHC 33.6 30.0 - 36.0 g/dL   RDW 16.7 (H) 11.5 - 15.5 %   Platelets PLATELET CLUMPS NOTED ON SMEAR, UNABLE TO ESTIMATE 150 - 400 K/uL    Comment: Immature Platelet Fraction may be clinically indicated, consider ordering this additional test [id number] PLATELET CLUMPS NOTED ON SMEAR, UNABLE TO ESTIMATE    nRBC 0.0 0.0 - 0.2 %   Neutrophils Relative % 84 %   Neutro Abs 9.9 (H) 1.7 - 7.7 K/uL   Lymphocytes Relative 6 %   Lymphs Abs 0.8 0.7 - 4.0 K/uL   Monocytes Relative 9 %   Monocytes Absolute 1.1 (H) 0.1 - 1.0 K/uL   Eosinophils Relative 1 %   Eosinophils Absolute 0.1 0.0 - 0.5 K/uL   Basophils Relative 0 %   Basophils Absolute 0.1 0.0 - 0.1 K/uL   Immature Granulocytes 0 %   Abs Immature Granulocytes 0.04 0.00 - 0.07 K/uL    Comment: Performed at [facility], [place] [address], [place], [place] [address]  Brain natriuretic peptide     Status: Abnormal   Collection Time: 04/27/21 12:29 PM  Result Value Ref Range   B Natriuretic Peptide 154.8 (H) 0.0 - 100.0 pg/mL  Comment: Performed at [facility], [place] [address] [place], [place] [address]  Troponin I (High Sensitivity)     Status: Abnormal   Collection Time: 04/27/21 12:29 PM  Result Value Ref Range   Troponin I (High Sensitivity) 18 (H) <18 ng/L    Comment: (NOTE) Elevated high sensitivity troponin I (hsTnI) values and significant  changes across serial measurements may suggest ACS but many other  chronic and acute conditions are known to elevate hsTnI results.  Refer to the "Links" section for chest pain algorithms and additional  guidance. Performed at [facility], [place] [address] [place] [address] [place], [place] [address]   Hepatic function panel     Status: Abnormal   Collection Time: 04/27/21 12:29 PM  Result Value Ref Range   Total Protein 6.4 (L) 6.5 - 8.1 g/dL   Albumin 2.2 (L) 3.5 - 5.0 g/dL   AST 63 (H) 15 - 41 U/L   ALT 42 0 - 44 U/L   Alkaline Phosphatase 99 38  - 126 U/L   Total Bilirubin 2.4 (H) 0.3 - 1.2 mg/dL   Bilirubin, Direct 0.6 (H) 0.0 - 0.2 mg/dL   Indirect Bilirubin 1.8 (H) 0.3 - 0.9 mg/dL    Comment: Performed at [facility] [address] [place], [place] [address]  I-stat chem 8, ED     Status: Abnormal   Collection Time: 04/27/21 12:55 PM  Result Value Ref Range   Sodium 137 135 - 145 mmol/L   Potassium 3.9 3.5 - 5.1 mmol/L   Chloride 104 98 - 111 mmol/L   BUN 21 8 - 23 mg/dL   Creatinine, Ser 0.70 0.44 - 1.00 mg/dL   Glucose, Bld 135 (H) 70 - 99 mg/dL    Comment: Glucose reference range applies only to samples taken after fasting for at least 8 hours.   Calcium, Ion 0.90 (L) 1.15 - 1.40 mmol/L   TCO2 28 22 - 32 mmol/L   Hemoglobin 11.6 (L) 12.0 - 15.0 g/dL   HCT 34.0 (L) 36.0 - 46.0 %  Troponin I (High Sensitivity)     Status: None   Collection Time: 04/27/21  2:24 PM  Result Value Ref Range   Troponin I (High Sensitivity) 16 <18 ng/L    Comment: (NOTE) Elevated high sensitivity troponin I (hsTnI) values and significant  changes across serial measurements may suggest ACS but many other  chronic and acute conditions are known to elevate hsTnI results.  Refer to the "Links" section for chest pain algorithms and additional  guidance. Performed at [facility], [place] [address] [place], [place] [address]   Protime-INR     Status: Abnormal   Collection Time: 04/27/21  4:37 PM  Result Value Ref Range   Prothrombin Time 18.8 (H) 11.4 - 15.2 seconds   INR 1.6 (H) 0.8 - 1.2    Comment: (NOTE) INR goal varies based on device and disease states. Performed at [facility], [place] [address] [place] [address] [place],  [address]   Resp Panel by RT-PCR (Flu A&B, Covid) Nasopharyngeal Swab     Status: None   Collection Time: 04/27/21  4:50 PM   Specimen: Nasopharyngeal Swab; Nasopharyngeal(NP) swabs in vial transport medium  Result Value Ref Range   SARS Coronavirus 2 by RT PCR NEGATIVE NEGATIVE    Comment:  (NOTE) SARS-CoV-2 target nucleic acids are NOT DETECTED.  The SARS-CoV-2 RNA is generally detectable in upper respiratory specimens during the acute phase of infection. The  lowest concentration of SARS-CoV-2 viral copies this assay can detect is 138 copies/mL. A negative result does not preclude SARS-Cov-2 infection and should not be used as the sole basis for treatment or other patient management decisions. A negative result may occur with  improper specimen collection/handling, submission of specimen other than nasopharyngeal swab, presence of viral mutation(s) within the areas targeted by this assay, and inadequate number of viral copies(<138 copies/mL). A negative result must be combined with clinical observations, patient history, and epidemiological information. The expected result is Negative.  Fact Sheet for Patients:  EntrepreneurPulse.com.au  Fact Sheet for Healthcare Providers:  IncredibleEmployment.be  This test is no t yet approved or cleared by the [place] FDA and  has been authorized for detection and/or diagnosis of SARS-CoV-2 by FDA under an Emergency Use Authorization (EUA). This EUA will remain  in effect (meaning this test can be used) for the duration of the COVID-19 declaration under Section 564(b)(1) of the Act, 21 U.S.C.section 360bbb-3(b)(1), unless the authorization is terminated  or revoked sooner.       Influenza A by PCR NEGATIVE NEGATIVE   Influenza B by PCR NEGATIVE NEGATIVE    Comment: (NOTE) The Xpert Xpress SARS-CoV-2/FLU/RSV plus assay is intended as an aid in the diagnosis of influenza from Nasopharyngeal swab specimens and should not be used as a sole basis for treatment. Nasal washings and aspirates are unacceptable for Xpert Xpress SARS-CoV-2/FLU/RSV testing.  Fact Sheet for Patients: EntrepreneurPulse.com.au  Fact Sheet for Healthcare  Providers: IncredibleEmployment.be  This test is not yet approved or cleared by the [place] FDA and has been authorized for detection and/or diagnosis of SARS-CoV-2 by FDA under an Emergency Use Authorization (EUA). This EUA will remain in effect (meaning this test can be used) for the duration of the COVID-19 declaration under Section 564(b)(1) of the Act, 21 U.S.C. section 360bbb-3(b)(1), unless the authorization is terminated or revoked.  Performed at [facility], [place] [address] [place], [place] [address]   Ammonia     Status: None   Collection Time: 04/27/21  6:15 PM  Result Value Ref Range   Ammonia 30 9 - 35 umol/L    Comment: Performed at [facility], [place] [address], [place], [place] [address]  Protime-INR     Status: Abnormal   Collection Time: 04/28/21  3:29 AM  Result Value Ref Range   Prothrombin Time 19.7 (H) 11.4 - 15.2 seconds   INR 1.7 (H) 0.8 - 1.2    Comment: (NOTE) INR goal varies based on device and disease states. Performed at [facility], [place] [address], [place], [place] [address]   Lactate dehydrogenase     Status: Abnormal   Collection Time: 04/28/21  3:29 AM  Result Value Ref Range   LDH 291 (H) 98 - 192 U/L    Comment: Performed at [facility], [place] [address], [place], [place] [address]  Basic metabolic panel     Status: Abnormal   Collection Time: 04/28/21  3:29 AM  Result Value Ref Range   Sodium 138 135 - 145 mmol/L   Potassium 3.9 3.5 - 5.1 mmol/L   Chloride 103 98 - 111 mmol/L   CO2 27 22 - 32 mmol/L   Glucose, Bld 121 (H) 70 - 99 mg/dL    Comment: Glucose reference range applies only to samples taken after fasting for at least 8 hours.   BUN 20 8 - 23 mg/dL   Creatinine, Ser 0.75  0.44 - 1.00 mg/dL   Calcium 8.4 (L) 8.9 - 10.3 mg/dL   GFR, Estimated >60 >60 mL/min    Comment: (NOTE) Calculated using the CKD-EPI Creatinine Equation (2021)    Anion gap 8 5 - 15    Comment: Performed at  [facility] [address], [place], [place] [address]  AFP tumor marker     Status: None   Collection Time: 04/28/21  3:29 AM  Result Value Ref Range   AFP, Serum, Tumor Marker <1.8 0.0 - 9.2 ng/mL    Comment: (NOTE) Roche [employer]ostics Electrochemiluminescence Immunoassay (ECLIA) Values obtained with different assay methods or kits cannot be used interchangeably.  Results cannot be interpreted as absolute evidence of the presence or absence of malignant disease. This test is not interpretable in pregnant females. Performed At: [facility] [place], [place] [id number] Rush Farmer MD IR:[id number]   Hemoglobin A1c     Status: Abnormal   Collection Time: 04/28/21  3:29 AM  Result Value Ref Range   Hgb A1c MFr Bld 4.3 (L) 4.8 - 5.6 %    Comment: (NOTE) Pre diabetes:          5.7%-6.4%  Diabetes:              >6.4%  Glycemic control for   <7.0% adults with diabetes    Mean Plasma Glucose 76.71 mg/dL    Comment: Performed at [facility] [address], [place], [place] [address]  Lactate dehydrogenase (pleural or peritoneal fluid)     Status: Abnormal   Collection Time: 04/28/21 12:54 PM  Result Value Ref Range   LD, Fluid 58 (H) 3 - 23 U/L    Comment: (NOTE) Results should be evaluated in conjunction with serum values    Fluid Type-FLDH PLEURAL     Comment: RIGHT FLUID Performed at [facility] [address], [place], [place] [address] CORRECTED ON 02/10 AT 1450: PREVIOUSLY REPORTED AS Lung, Right   Body fluid cell count with differential     Status: Abnormal   Collection Time: 04/28/21 12:54 PM  Result Value Ref Range   Fluid Type-FCT PLEURAL     Comment: RIGHT FLUID CORRECTED ON 02/10 AT 1450: PREVIOUSLY REPORTED AS Lung, Right    Color, Fluid RED    Appearance, Fluid CLOUDY (A) CLEAR   Total Nucleated Cell Count, Fluid 97 0 - 1,000 cu mm   Neutrophil Count, Fluid 22 0 - 25 %   Lymphs, Fluid 57 %    Monocyte-Macrophage-Serous Fluid 21 (L) 50 - 90 %   Eos, Fluid 0 %   Other Cells, Fluid MESOTHELIAL CELLS %    Comment: Performed at [facility], [place] [address], [place], [place] [address]  Albumin, pleural or peritoneal fluid      Status: None   Collection Time: 04/28/21 12:54 PM  Result Value Ref Range   Albumin, Fluid <1.5 g/dL    Comment: (NOTE) No normal range established for this test Results should be evaluated in conjunction with serum values    Fluid Type-FALB PLEURAL     Comment: RIGHT FLUID Performed at [facility] [address] [place], [place] [address] CORRECTED ON 02/10 AT 1450: PREVIOUSLY REPORTED AS Lung, Right   Protein, pleural or peritoneal fluid     Status: None   Collection Time: 04/28/21 12:54 PM  Result Value Ref Range   Total protein, fluid <3.0 g/dL    Comment: (NOTE)  No normal range established for this test Results should be evaluated in conjunction with serum values    Fluid Type-FTP PLEURAL     Comment: RIGHT FLUID Performed at [facility], [place] [address] [place], [place] [address] CORRECTED ON 02/10 AT 1450: PREVIOUSLY REPORTED AS Lung, Right   Glucose, pleural or peritoneal fluid     Status: None   Collection Time: 04/28/21 12:54 PM  Result Value Ref Range   Glucose, Fluid 139 mg/dL    Comment: (NOTE) No normal range established for this test Results should be evaluated in conjunction with serum values    Fluid Type-FGLU PLEURAL     Comment: RIGHT FLUID Performed at [facility] [address], [place], [place] [address] CORRECTED ON 02/10 AT 1450: PREVIOUSLY REPORTED AS Lung, Right   Culture, body fluid w Gram Stain-bottle     Status: None (Preliminary result)   Collection Time: 04/28/21 12:54 PM   Specimen: Pleura  Result Value Ref Range   Specimen Description PLEURAL FLUID RIGHT    Special Requests NONE    Culture      NO GROWTH < 24 HOURS Performed at [facility], [place] [address]  [place], [place] [address]    Report Status PENDING   Gram stain     Status: None   Collection Time: 04/28/21 12:54 PM   Specimen: Pleura  Result Value Ref Range   Specimen Description PLEURAL FLUID RIGHT    Special Requests NONE    Gram Stain      WBC PRESENT, PREDOMINANTLY MONONUCLEAR NO ORGANISMS SEEN CYTOSPIN SMEAR Performed at [facility], [place] [address], [place], [place] [address]    Report Status 04/28/2021 FINAL   Basic metabolic panel     Status: Abnormal   Collection Time: 04/29/21  2:22 AM  Result Value Ref Range   Sodium 139 135 - 145 mmol/L   Potassium 4.1 3.5 - 5.1 mmol/L   Chloride 104 98 - 111 mmol/L   CO2 26 22 - 32 mmol/L   Glucose, Bld 116 (H) 70 - 99 mg/dL    Comment: Glucose reference range applies only to samples taken after fasting for at least 8 hours.   BUN 26 (H) 8 - 23 mg/dL   Creatinine, Ser 0.81 0.44 - 1.00 mg/dL   Calcium 8.8 (L) 8.9 - 10.3 mg/dL   GFR, Estimated >60 >60 mL/min    Comment: (NOTE) Calculated using the CKD-EPI Creatinine Equation (2021)    Anion gap 9 5 - 15    Comment: Performed at [facility] [address], [place],  [address]  CBC with Differential/Platelet     Status: Abnormal   Collection Time: 04/29/21  2:22 AM  Result Value Ref Range   WBC 16.9 (H) 4.0 - 10.5 K/uL   RBC 3.20 (L) 3.87 - 5.11 MIL/uL   Hemoglobin 9.8 (L) 12.0 - 15.0 g/dL   HCT 30.9 (L) 36.0 - 46.0 %   MCV 96.6 80.0 - 100.0 fL   MCH 30.6 26.0 - 34.0 pg   MCHC 31.7 30.0 - 36.0 g/dL   RDW 16.7 (H) 11.5 - 15.5 %   Platelets 114 (L) 150 - 400 K/uL    Comment: Immature Platelet Fraction may be clinically indicated, consider ordering this additional test [id number] REPEATED TO VERIFY PLATELET COUNT CONFIRMED BY SMEAR    nRBC 0.0 0.0 - 0.2 %   Neutrophils Relative % 82 %   Neutro Abs  14.0 (H) 1.7 - 7.7 K/uL   Lymphocytes Relative 6 %   Lymphs Abs 1.0 0.7 - 4.0 K/uL   Monocytes Relative 10 %   Monocytes Absolute 1.6 (H) 0.1 - 1.0 K/uL    Eosinophils Relative 1 %   Eosinophils Absolute 0.1 0.0 - 0.5 K/uL   Basophils Relative 0 %   Basophils Absolute 0.1 0.0 - 0.1 K/uL   Immature Granulocytes 1 %   Abs Immature Granulocytes 0.09 (H) 0.00 - 0.07 K/uL    Comment: Performed at [facility] [address], [place], [place] [address]    Studies/Results: DG Chest 1 View  Result Date: 04/28/2021 CLINICAL DATA:  Status post right thoracentesis. EXAM: CHEST  1 VIEW COMPARISON:  Dated April 27, 2021 FINDINGS: Visualized cardiac and mediastinal contours are unchanged. Moderate right pleural effusion, decreased in size when compared with prior exam. No evidence of pneumothorax. Increased heterogeneous opacities of the left hemithorax. No large pleural effusion or pneumothorax. IMPRESSION: 1. Moderate right pleural effusion, decreased in size when compared with prior exam. No evidence pneumothorax. 2. Increased heterogeneous opacities of the left hemithorax, concerning for infection or asymmetric pulmonary edema. Electronically Signed   By: Yetta Glassman M.D.   On: 04/28/2021 13:11   DG Chest 2 View  Result Date: 04/27/2021 CLINICAL DATA:  Shortness of breath EXAM: CHEST - 2 VIEW COMPARISON:  11/18/2020 FINDINGS: Interval development of large right-sided pleural effusion. Hazy opacity within the aerated portion of the right upper lobe. Left lung is clear. No left-sided pleural effusion. No pneumothorax. Grossly stable heart size. Aortic atherosclerosis. IMPRESSION: Interval development of large right-sided pleural effusion. Electronically Signed   By: Davina Poke D.O.   On: 04/27/2021 13:00   CT Chest W Contrast  Result Date: 04/27/2021 CLINICAL DATA:  A 71 year old female presents for evaluation of large RIGHT-sided pleural effusion. EXAM: CT CHEST WITH CONTRAST TECHNIQUE: Multidetector CT imaging of the chest was performed during intravenous contrast administration. RADIATION DOSE REDUCTION: This exam was performed according to  the departmental dose-optimization program which includes automated exposure control, adjustment of the mA and/or kV according to patient size and/or use of iterative reconstruction technique. CONTRAST:  142m OMNIPAQUE IOHEXOL 300 MG/ML  SOLN COMPARISON:  CT abdomen and pelvis from 2018. FINDINGS: Cardiovascular: Calcified atheromatous plaque of the thoracic aorta. No aneurysmal dilation. Central pulmonary vasculature is unremarkable on venous phase. Heart size top normal without pericardial effusion. Mediastinum/Nodes: No thoracic inlet, axillary, mediastinal or hilar adenopathy. Esophagus grossly normal. Lungs/Pleura: Large RIGHT-sided pleural effusion with complete collapse of the RIGHT lower lobe and middle lobe and partial collapse of the RIGHT upper lobe. Pleural effusion extends into the lower chest and is sub pulmonic as well as tracking along the dependent chest. Features may be mildly loculated based on appearance in the upper and lower chest as it extends anteriorly. Patchy areas of ground-glass attenuation in the LEFT chest are geographic, some areas measuring up to 4.2 x 3.9 cm. Some more nodular appearing areas (image 37/5) measuring between 20 and 18 mm when compared to other scattered areas of more discrete nodularity. The airways to the LEFT chest are patent. Mild narrowing of airways to the RIGHT chest without gross endobronchial abnormality of large airways. No signs of dependent pleural nodularity. Most inferior aspect of the pleural space is not imaged as it is distended extending well inferior to what would be expected and inferior to the pleural space in the LEFT chest. Upper Abdomen: Marked hepatic cirrhosis with  nodular shrunken hepatic contour. Marked worsening of liver disease compared to the exam of Aug 03, 2016. Cholelithiasis. Gallbladder does not appear distended but is not well assessed due to respiratory motion. Massive perigastric, gastric and moderate-size esophageal varices.  Perigastric collateral venous network measuring up to 11 x 6 cm. Gastric varix in the gastric cardia/fundus 2.3 x 2.0 cm can be seen communicating with this large variceal network in the upper abdomen extending from coronary vein. Large splenorenal shunt. Splenomegaly. Small volume ascites in the abdomen. No acute findings relative to imaged portions of the kidneys and adrenal glands. Question RIGHT colonic thickening. Musculoskeletal: Spinal degenerative changes. No acute musculoskeletal process or destructive bone finding. IMPRESSION: 1. Large RIGHT-sided pleural effusion with near complete collapse of the lung in the RIGHT chest with partial sparing of the RIGHT upper lobe. Findings are likely related to hepatic hydrothorax in this patient with signs of worsening liver disease/cirrhosis and evidence of ascites in the upper abdomen. 2. Marked hepatic cirrhosis with signs of severe portal hypertension and evidence of large perigastric and gastric varices. GI evaluation and interventional radiology consultation may be helpful for further management as warranted. 3. Cholelithiasis, gallbladder not well assessed due to respiratory motion in the upper abdomen. 4. Signs of potential colonic thickening, correlate with any abdominal symptoms that would indicate portal colopathy. Ultimately abdominal imaging may be helpful for further evaluation in this complex patient. 5. Aortic atherosclerosis 6. Patchy areas of ground-glass attenuation in the LEFT upper lobe most suggestive of infectious or inflammatory changes, suggest 3 month follow-up to ensure resolution Aortic Atherosclerosis (ICD10-I70.0). Electronically Signed   By: Zetta Bills M.D.   On: 04/27/2021 15:30   US Abdomen Limited  Result Date: 04/29/2021 CLINICAL DATA:  Ascites EXAM: LIMITED ABDOMEN ULTRASOUND FOR ASCITES TECHNIQUE: Limited ultrasound survey for ascites was performed in all four abdominal quadrants. COMPARISON:  None. FINDINGS: No significant  ascites was identified. IMPRESSION: No significant ascites.  Paracentesis canceled. Electronically Signed   By: Miachel Roux M.D.   On: 04/29/2021 10:42   IR THORACENTESIS ASP PLEURAL SPACE W/IMG GUIDE  Result Date: 04/28/2021 INDICATION: Patient with history of NASH cirrhosis, presented to the ED with shortness of breath. Found to have a right-sided pleural effusion. Request is for therapeutic and diagnostic thoracentesis. EXAM: ULTRASOUND GUIDED DIAGNOSTIC AND THERAPEUTIC THORACENTESIS MEDICATIONS: Lidocaine 1% 10 mL COMPLICATIONS: None immediate. PROCEDURE: An ultrasound guided thoracentesis was thoroughly discussed with the patient and questions answered. The benefits, risks, alternatives and complications were also discussed. The patient understands and wishes to proceed with the procedure. Written consent was obtained. Ultrasound was performed to localize and mark an adequate pocket of fluid in the right chest. The area was then prepped and draped in the normal sterile fashion. 1% Lidocaine was used for local anesthesia. Under ultrasound guidance a 19 gauge, 7-cm, Yueh catheter was introduced. Thoracentesis was performed. The catheter was removed and a dressing applied. FINDINGS: A total of approximately 1.55 L of serosanguineous fluid was removed. Samples were sent to the laboratory as requested by the clinical team. IMPRESSION: Successful ultrasound guided right-sided diagnostic and therapeutic thoracentesis yielding 1.55 L of pleural fluid. Read by: Rushie Nyhan, NP Electronically Signed   By: Corrie Mckusick D.O.   On: 04/28/2021 15:41    Medications: I have reviewed the patient's current medications.  Assessment: Decompensated cirrhosis?  NASH related Hepatic hydrothorax-status post right-sided thoracocentesis(WBC count 97, 22% neutrophils, albumin less than 1.5, total protein less than 3) History of hepatic encephalopathy-on lactulose History  of esophageal varices  CT from 04/27/2021 with  contrast showed Large right-sided pleural effusion with near complete collapse of lung Marked hepatic cirrhosis with signs of severe portal hypertension Evidence of large perigastric and gastric varices  MELD sodium score 16  Plan: IR evaluation for TIPS which will help patient with hepatic hydrothorax, ascites and perigastric and gastric varices and severe portal hypertension  Patient currently on IV albumin 25 g every 6 hours for 2 days, Lasix 60 mg IV twice a day, spironolactone 100 mg once a day As patient does not have significant pedal edema, although renal function is currently within normal limits, dosage of diuretic would likely have to be reduced in the next 24 hours. Recommend changing to 40 mg furosemide daily and to continue spironolactone 100 mg daily.  Continue lactulose 30 g twice a day and Xifaxan 550 mg twice a day. If patient deemed an appropriate candidate for TIPS and undergoes TIPS, she will be at high risk of hepatic encephalopathy and will need to continue lactulose, and Xifaxan 550 mg twice a day.  Discussed the same with the patient as well as patient's hospitalist Dr. Pietro Cassis.  Ronnette Juniper, MD 04/29/2021, 10:55 AM

## 2021-04-29 NOTE — Consult Note (Signed)
NAME:  Courtney Braun, MRN:  [id number], DOB:  09/10/1950, LOS: 2 ADMISSION DATE:  04/27/2021, CONSULTATION DATE: 04/29/2021 REFERRING MD: Dr. Pietro Cassis, CHIEF COMPLAINT: Acute respiratory failure  History of Present Illness:  71 year old woman with a history of [name] cirrhosis complicated by esophageal varices, chronic anemia and right hepatic hydrothorax.  Also with a history of diabetes, hypertension, hyperlipidemia. Admitted 2/9 with progressive abdominal distention and associated dyspnea.  She has had an overall decline in her health over about 1 year.  Diuretic dosing has been adjusted as an outpatient but symptoms continue to progress.  Admitted for further care.  She underwent a right thoracentesis on 2/10.  Per RN she was doing well on 4 L/min morning of 2/11, then developed a sudden decline in respiratory status, more dyspnea, more hypoxemia and increased cough with yellow secretions.  Now on 15 L/min.  Chest x-ray reviewed, shows increasing bilateral alveolar infiltrates, particularly on the right.  The right pleural effusion is smaller than at presentation, has not increased significantly since thoracentesis.  Lasix given, empiric ceftriaxone started.  PCCM consulted to eval. She will move to ICU for further care  Pertinent  Medical History   Past Medical History:  Diagnosis Date   Anemia    Diabetes mellitus without complication ([place])    type II   History of blood transfusion    Hyperlipidemia    Hypertension     Significant Hospital Events: Including procedures, antibiotic start and stop dates in addition to other pertinent events   CT chest 2/9 >> large right pleural effusion with compressive atelectasis of the right lower and right middle lobes.  Marked hepatic cirrhosis with severe portal hypertension and evidence for large perigastric and gastric varices.  Scattered patchy left upper lobe groundglass infiltrates Chest x-ray 2/11 >> progression of bilateral alveolar infiltrates,  no real increase in size of pleural effusions since thoracentesis Pleural fluid 2/10 >> transudate.  WBC 97 (22 N, 57L, 21 M, 0E).  Gram stain negative TTE 2/11 >>   Interim History / Subjective:  Patient states that she is more dyspneic.  She is coughing up purulent sputum.  No hemoptysis. I/O- 570 cc total Abdominal ultrasound without significant ascites, no paracentesis performed  Objective   Blood pressure 132/70, pulse 92, temperature 98.2 F (36.8 C), temperature source Oral, resp. rate (!) 28, height 6' (1.829 m), weight 112.3 kg, SpO2 93 %.        Intake/Output Summary (Last 24 hours) at 04/29/2021 1423 Last data filed at 04/29/2021 0427 Gross per 24 hour  Intake 534.54 ml  Output 200 ml  Net 334.54 ml   Filed Weights   04/28/21 1607  Weight: 112.3 kg    Examination: General: Ill-appearing woman in moderate respiratory distress, tachypneic HENT: Upper airway secretions and noise heard, no stridor.  Strong voice.  Somewhat ineffective cough Lungs: Coarse bilateral end inspiratory breath sounds Cardiovascular: Regular, tachycardic, distant, no murmur Abdomen: Obese, somewhat distended.  Nontender with positive bowel sounds Extremities: 1+ lower extremity edema Neuro: Awake, alert, interacts and answers questions, follows commands GU: Resolved Hospital Problem list     Assessment & Plan:  Acute hypoxemic respiratory failure.  Diffuse bilateral pulmonary infiltrates likely the cause.  In setting of increasing WBC, cough, purulent sputum concerning for HCAP.  Consider also pulmonary edema although she has been effectively diuresed and is net negative -Agree with procalcitonin. Also check BNP, d-dimer although suspicion for PE low -We will broaden her antibiotics from ceftriaxone to cefepime/vancomycin -  Continue aggressive diuresis as her blood pressure and renal function will tolerate -She is at high risk for respiratory failure and mechanical ventilation.  She may be a  candidate for BiPAP but I am concerned about her airway protection and her secretion management.  Discussed with her that she may need intubation.  She would except this for short trial to reverse acute decompensation. -Add bronchodilators  Decompensated [place] cirrhosis.  MELD score 16.  Complicated by portal gastropathy with significant gastric varices, esophageal varices. -Given her portal gastropathy as well as her hepatic hydrothorax plans in place to consult IR for TIPS -Has received scheduled albumin for 2 days and Lasix 60 mg IV twice daily, Aldactone.  Plans had been in place to decrease her diuretics but given acute decompensation will plan to continue at higher doses -careful w OGT placement given her hx varices  Hepatic coagulopathy -follow INR -consider FFP if she needs procedures  History of hepatic encephalopathy -On lactulose at home, continue -Continue rifaximin  Chronic thrombocytopenia -following CBC -no indication transfusion at this time  Diabetes with hyperglycemia -start CBG's and SSI coverage   Best Practice (right click and "Reselect all SmartList Selections" daily)   Diet/type: NPO w/ meds via tube DVT prophylaxis: SCD GI prophylaxis: PPI Lines: N/A Foley:  N/A Code Status:  full code Last date of multidisciplinary goals of care discussion [discussed with patient 2/11]  Niece updated by phone by RN on 2/11  Labs   CBC: Recent Labs  Lab 04/27/21 1229 04/27/21 1255 04/29/21 0222  WBC 12.0*  --  16.9*  NEUTROABS 9.9*  --  14.0*  HGB 11.6* 11.6* 9.8*  HCT 34.5* 34.0* 30.9*  MCV 93.5  --  96.6  PLT PLATELET CLUMPS NOTED ON SMEAR, UNABLE TO ESTIMATE  --  114*    Basic Metabolic Panel: Recent Labs  Lab 04/27/21 1229 04/27/21 1255 04/28/21 0329 04/29/21 0222  NA 139 137 138 139  K 4.0 3.9 3.9 4.1  CL 102 104 103 104  CO2 29  --  27 26  GLUCOSE 138* 135* 121* 116*  BUN 20 21 20  26*  CREATININE 0.74 0.70 0.75 0.81  CALCIUM 8.6*  --  8.4*  8.8*   GFR: Estimated Creatinine Clearance: 86.7 mL/min (by C-G formula based on SCr of 0.81 mg/dL). Recent Labs  Lab 04/27/21 1229 04/29/21 0222  WBC 12.0* 16.9*    Liver Function Tests: Recent Labs  Lab 04/27/21 1229  AST 63*  ALT 42  ALKPHOS 99  BILITOT 2.4*  PROT 6.4*  ALBUMIN 2.2*   No results for input(s): LIPASE, AMYLASE in the last 168 hours. Recent Labs  Lab 04/27/21 1815  AMMONIA 30    ABG    Component Value Date/Time   TCO2 28 04/27/2021 1255     Coagulation Profile: Recent Labs  Lab 04/27/21 1637 04/28/21 0329  INR 1.6* 1.7*    Cardiac Enzymes: No results for input(s): CKTOTAL, CKMB, CKMBINDEX, TROPONINI in the last 168 hours.  HbA1C: Hgb A1c MFr Bld  Date/Time Value Ref Range Status  04/28/2021 03:29 AM 4.3 (L) 4.8 - 5.6 % Final    Comment:    (NOTE) Pre diabetes:          5.7%-6.4%  Diabetes:              >6.4%  Glycemic control for   <7.0% adults with diabetes     CBG: No results for input(s): GLUCAP in the last 168 hours.  Review of Systems:  Significant shortness of breath Increased cough with purulent sputum No chest pain  Past Medical History:  She,  has a past medical history of Anemia, Diabetes mellitus without complication ([place]), History of blood transfusion, Hyperlipidemia, and Hypertension.   Surgical History:   Past Surgical History:  Procedure Laterality Date   ESOPHAGOGASTRODUODENOSCOPY N/A 08/06/2016   Procedure: ESOPHAGOGASTRODUODENOSCOPY (EGD);  Surgeon: Clarene Essex, MD;  Location: [facility];  Service: Endoscopy;  Laterality: N/A;   ESOPHAGOGASTRODUODENOSCOPY (EGD) WITH PROPOFOL N/A 05/09/2018   Procedure: ESOPHAGOGASTRODUODENOSCOPY (EGD) WITH PROPOFOL;  Surgeon: Clarene Essex, MD;  Location: [facility];  Service: Endoscopy;  Laterality: N/A;   ESOPHAGOGASTRODUODENOSCOPY (EGD) WITH PROPOFOL N/A 11/04/2018   Procedure: ESOPHAGOGASTRODUODENOSCOPY (EGD) WITH PROPOFOL;  Surgeon: Clarene Essex, MD;  Location: [facility];  Service: Endoscopy;  Laterality: N/A;   GASTRIC VARICES BANDING N/A 05/09/2018   Procedure: GASTRIC VARICES BANDING;  Surgeon: Clarene Essex, MD;  Location: [facility];  Service: Endoscopy;  Laterality: N/A;   IR THORACENTESIS ASP PLEURAL SPACE W/IMG GUIDE  04/28/2021   none     TONSILLECTOMY       Social History:   reports that she has never smoked. She has never used smokeless tobacco. She reports current alcohol use. She reports that she does not use drugs.   Family History:  Her family history includes Hyperlipidemia in an other family member.   Allergies Allergies  Allergen Reactions   Metformin And Related     Upset stomach   Voltaren [Diclofenac]     diarrhea     Home Medications  Prior to Admission medications   Medication Sig Start Date End Date Taking? Authorizing Provider  acetaminophen (TYLENOL) 500 MG tablet Take 1,000 mg by mouth every 6 (six) hours as needed for moderate pain or headache.   Yes [provider]  cetirizine (ZYRTEC) 10 MG tablet Take 10 mg by mouth daily as needed for allergies.   Yes [provider]  ferrous sulfate 325 (65 FE) MG tablet Take 325 mg by mouth daily with breakfast.   Yes [provider]  furosemide (LASIX) 20 MG tablet Take 20 mg by mouth daily. 03/29/21  Yes [provider]  Lactulose 20 GM/30ML SOLN Take 30-45 mLs by mouth 3 (three) times daily.   Yes [provider]  Multiple Vitamin (MULTIVITAMIN WITH MINERALS) TABS tablet Take 1 tablet by mouth daily.   Yes [provider]  ondansetron (ZOFRAN ODT) 8 MG disintegrating tablet Take 1 tablet (8 mg total) by mouth every 8 (eight) hours as needed for nausea or vomiting. 11/19/20  Yes Dorie Rank, MD  sertraline (ZOLOFT) 50 MG tablet Take 50 mg by mouth daily.   Yes [provider]  simvastatin (ZOCOR) 40 MG tablet Take 40 mg by mouth at bedtime.    Yes [provider]  spironolactone (ALDACTONE) 50 MG tablet  Take 50 mg by mouth daily. 04/21/21  Yes [provider]  lisinopril-hydrochlorothiazide (ZESTORETIC) 20-12.5 MG tablet Take 1 tablet by mouth daily. Patient not taking: Reported on 04/28/2021 08/19/18   [provider]  propranolol (INDERAL) 40 MG tablet Take 20 mg by mouth 2 (two) times daily. Patient not taking: Reported on 04/28/2021 09/12/18   [provider]     Critical care time: 13 min     Baltazar Apo, MD, PhD 04/29/2021, 3:12 PM [facility] [phone] or if no answer before 7:00PM call [phone] For any issues after 7:00PM please call eLink [phone]

## 2021-04-29 NOTE — Progress Notes (Signed)
Patient admitted 04/27/21 with decompensated cirrhosis - CT chest w/contrast showed large right pleural effusion and small volume ascites. She is s/p right thoracentesis yesterday in IR yielding 1.55 L. Request to IR for diagnostic and therapeutic paracentesis.  Limited abd [place] of all 4 quadrants shows no ascites. No procedure performed, patient returned to floor in stable condition.  Images available for review under imaging section of Epic. IR remains available as needed.  Candiss Norse, PA-C

## 2021-04-29 NOTE — Progress Notes (Addendum)
[facility] Progress Note Patient Name: Courtney Braun DOB: 03-Jun-1950 MRN: [id number]   Date of Service  04/29/2021  HPI/Events of Note  BP 89/52 (55) in cirrhotic patient LA wnl. On broad spectrum antibiotics  eICU Interventions  Reduce PEEP to 5. Order AM ABG Will order peripheral levophed as need for MAP goal >60. Hold anti-hypertensive meds (incl propanalol tonight)     Intervention Category Intermediate Interventions: Hypotension - evaluation and management  [name] 04/29/2021, 8:44 PM

## 2021-04-29 NOTE — Procedures (Signed)
Intubation Procedure Note Orogastric tube placement  Courtney Braun  [id number]  1950/10/10  Date:04/29/21  Time:3:35 PM   Provider Performing:Yasenia Reedy S Tata Timmins    Procedure: Intubation (31500)  Indication(s) Respiratory Failure Need for meds and nutrition  Consent Risks of the procedure as well as the alternatives and risks of each were explained to the patient and/or caregiver.  Consent for the procedure was obtained and is signed in the bedside chart   Anesthesia Etomidate, Versed, and Fentanyl   Time Out Verified patient identification, verified procedure, site/side was marked, verified correct patient position, special equipment/implants available, medications/allergies/relevant history reviewed, required imaging and test results available.   Sterile Technique Usual hand hygeine, masks, and gloves were used   Procedure Description Patient positioned in bed supine.  Sedation given as noted above.  Patient was intubated with endotracheal tube using Glidescope.  View was Grade 1 full glottis .  Number of attempts was 1.  Colorimetric CO2 detector was consistent with tracheal placement.  Using direct visualization with glidescope, orogastric tube was placed. Auscultation confirmed position.    Complications/Tolerance None; patient tolerated the procedure well. Chest X-ray and KUB are ordered to verify placement.   EBL None   Specimen(s) None   Baltazar Apo, MD, PhD 04/29/2021, 3:36 PM [facility] [phone] or if no answer before 7:00PM call [phone] For any issues after 7:00PM please call eLink [phone]

## 2021-04-29 NOTE — Progress Notes (Signed)
°   04/29/21 1336  Vitals  Temp 98.2 F (36.8 C)  Temp Source Oral  BP 132/70  MAP (mmHg) 88  BP Location Left Arm  BP Method Automatic  Patient Position (if appropriate) Lying  Pulse Rate 92  Resp (!) 28  MEWS COLOR  MEWS Score Color Yellow  Oxygen Therapy  SpO2 93 %  O2 Device Non-rebreather Mask  O2 Flow Rate (L/min) 15 L/min  MEWS Score  MEWS Temp 0  MEWS Systolic 0  MEWS Pulse 0  MEWS RR 2  MEWS LOC 0  MEWS Score 2  Provider Notification  Provider Name/Title Dr, Pietro Cassis  Date Provider Notified 04/29/21  Time Provider Notified 1336  Notification Type Face-to-face  Notification Reason Change in status  Provider response See new orders  Date of Provider Response 04/29/21  Time of Provider Response 1336  Rapid Response Notification  Name of Rapid Response RN Notified Antoine Primas  Date Rapid Response Notified 04/29/21  Time Rapid Response Notified 1336

## 2021-04-29 NOTE — Progress Notes (Signed)
Brief Nutrition Note RD working remotely.   Consult received for enteral/tube feeding initiation and management. Patient intubated a short time ago. OGT placed and abdominal x-ray obtained but not yet read.  She was previously on a Heart Healthy diet and ate 100% of dinner last night.  Adult Enteral Nutrition Protocol initiated. Full assessment to follow.  Admitting Dx: Liver failure ([place]) [K72.90] Pleural effusion [J90] S/P thoracentesis [Z98.890] Cirrhosis of liver with ascites, unspecified hepatic cirrhosis type ([place]) [K74.60, R18.8]  Body mass index is 40.57 kg/m. Pt meets criteria for obesity based on current BMI.  Labs:  Recent Labs  Lab 04/27/21 1229 04/27/21 1255 04/28/21 0329 04/29/21 0222  NA 139 137 138 139  K 4.0 3.9 3.9 4.1  CL 102 104 103 104  CO2 29  --  27 26  BUN 20 21 20  26*  CREATININE 0.74 0.70 0.75 0.81  CALCIUM 8.6*  --  8.4* 8.8*  GLUCOSE 138* 135* 121* 116*        Jarome Matin, MS, RD, LDN Inpatient Clinical Dietitian RD pager # available in [facility]  After hours/weekend pager # available in [facility]

## 2021-04-29 NOTE — Progress Notes (Signed)
PROGRESS NOTE  Courtney Braun  DOB: 1950/11/08  PCP: Mayra Neer, MD ZOX:[id number]  DOA: 04/27/2021  LOS: 2 days  Hospital Day: 3  Brief narrative: Courtney Braun is a 71 y.o. female with PMH significant for nonalcoholic liver cirrhosis secondary to [facility], esophageal varices with history of bleeding in 2018, chronic anemia, DM 2, HTN, HLD.  Patient presented to the ED on 2/9 with complaint of progressive worsening shortness of breath and abdominal distention. Patient follows up with hepatologist at [facility].  Over the last year, but she has had noticed gradual decline in her health since her son passed away last year.  For the last few weeks, patient has noticed progressively worsening swelling of her abdomen and legs.  Recently her Lasix dose was increased from 40 mg to 60 mg daily but only with mild relief. Reports compliance to cirrhosis medicines at home including lactulose, reports about 6-8 bowel movements a day.  In the ED, patient was afebrile, tachycardic to 112, blood pressure 141/68 94% oxygen saturation on room air. BMP with normal sodium, potassium, creatinine, alk phos.  Slightly elevated AST and indirect bilirubin WBC count 12, hemoglobin 11.6 INR 1.7 Ammonia level normal Chest x-ray with large right-sided pleural effusion Admitted to hospital service GI was consulted.  Subjective: Patient was seen and examined this am. Elderly Caucasian female.  Lying on bed.  On 4 L oxygen via nasal cannula.  She underwent thoracentesis yesterday with some relief but she is back on oxygen requirement again this morning.   She last received a dose of Lasix 60 mg IV at 8:30 this morning.  Later this morning after next few hours, patient became more short of breath requiring up to 15 L oxygen by nasal cannula.   Chest x-ray done, seem to have multiple symmetric pulm edema.  Pending formal report.  Assessment/Plan: Active Problems:   Acute respiratory failure with hypoxia  (HCC)   Liver cirrhosis secondary to NASH (HCC)   Portal hypertension (HCC)   Ascites   Hx of esophageal varices   Pleural effusion, right   Benign essential HTN    Acute respiratory failure with hypoxia -Patient was hypoxic on presentation due to pleural effusion.  Despite thoracentesis, respiratory status worsened.  Currently requiring 15 L high flow oxygen by nasal cannula.  Chest x-ray with significant bilateral asymmetric edema despite getting IV Lasix. -Obtain procalcitonin level.  While waiting for x-ray report, I will give 1 more dose of IV Lasix.  She was given 1 mg of IV Ativan earlier for anxiety. -Start on empiric antibiotic treatment with IV Rocephin. -PCCM consulted.  Large right pleural effusion Hepatic hydrothorax -Ultrasound-guided thoracentesis done. 1.5 lit pleural fluid drained.  Decompensated nonalcoholic liver cirrhosis Portal hypertension with ascites -Presented with increased ascites, shortness of breath -Slightly elevated AST, indirect bili, INR -MELD score 16 -Ultrasound-guided paracentesis ordered.  However, not enough fluid recommendation identified for tapping. -At home, patient was on lactulose, Lasix 20 mg daily, Aldactone 50 mg daily.  It seems patient was not taking propranolol 20 mg twice daily. -Currently patient is on IV Lasix, IV albumin infusion. Recent Labs  Lab 04/27/21 1229 04/27/21 1637 04/27/21 1815 04/28/21 0329  AST 63*  --   --   --   ALT 42  --   --   --   ALKPHOS 99  --   --   --   BILITOT 2.4*  --   --   --   BILIDIR 0.6*  --   --   --  IBILI 1.8*  --   --   --   PROT 6.4*  --   --   --   ALBUMIN 2.2*  --   --   --   AMMONIA  --   --  30  --   INR  --  1.6*  --  1.7*    MELD-Na score: 16 at 04/29/2021  2:22 AM MELD score: 16 at 04/29/2021  2:22 AM Calculated from: Serum Creatinine: 0.81 mg/dL (Using min of 1 mg/dL) at 04/29/2021  2:22 AM Serum Sodium: 139 mmol/L (Using max of 137 mmol/L) at 04/29/2021  2:22 AM Total  Bilirubin: 2.4 mg/dL at 04/27/2021 12:29 PM INR(ratio): 1.7 at 04/28/2021  3:29 AM Age: 16 years  History of hepatic encephalopathy -Continue lactulose and Xifaxan.  Esophageal varices with history of GI bleeding -Per GI, patient has large perigastric and gastric varices. -IR consulted for TIPS  Essential hypertension -Blood pressure meds per portal hypertension regimen.   Hx of Type 2 diabetes mellitus -A1C 4.3. -Seems diet controlled.   Mild chronic anemia -Continue iron supplement Recent Labs    11/18/20 1758 04/27/21 1229 04/27/21 1255 04/29/21 0222  HGB 12.3 11.6* 11.6* 9.8*  MCV 91.6 93.5  --  96.6   Mobility: encourage ambulation Goals of care   Code Status: Full Code    Nutritional status:  Body mass index is 33.58 kg/m.      Diet:  Diet Order             Diet Heart Room service appropriate? Yes; Fluid consistency: Thin; Fluid restriction: 2000 mL Fluid  Diet effective now                   DVT prophylaxis:  SCDs Start: 04/27/21 1822   Antimicrobials: none Fluid:  Consultants: GI, IR Family Communication: none at bedside.   Status is: inpatient  Continue in-hospital care because: Respiratory status worsening Level of care: ICU   Dispo: The patient is from: home              Anticipated d/c is to: home              Patient currently is not medically stable to d/c.   Difficult to place patient No     Infusions:   sodium chloride     albumin human 25 g (04/29/21 0827)   cefTRIAXone (ROCEPHIN)  IV      Scheduled Meds:  ferrous sulfate  325 mg Oral Q breakfast   furosemide  40 mg Intravenous Once   lactulose  30 g Oral BID   lidocaine (PF)       loratadine  10 mg Oral Daily   multivitamin with minerals  1 tablet Oral Daily   propranolol  40 mg Oral BID   rifaximin  550 mg Oral BID   simvastatin  40 mg Oral QHS   sodium chloride flush  3 mL Intravenous Q12H   spironolactone  100 mg Oral Daily    PRN meds: sodium chloride,  acetaminophen, acetaminophen, ipratropium-albuterol, lidocaine (PF), ondansetron (ZOFRAN) IV, ondansetron, sodium chloride flush   Antimicrobials: Anti-infectives (From admission, onward)    Start     Dose/Rate Route Frequency Ordered Stop   04/29/21 1430  cefTRIAXone (ROCEPHIN) 1 g in sodium chloride 0.9 % 100 mL IVPB        1 g 200 mL/hr over 30 Minutes Intravenous Every 24 hours 04/29/21 1336     04/27/21 2200  rifaximin (XIFAXAN) tablet  550 mg        550 mg Oral 2 times daily 04/27/21 1822         Objective: Vitals:   04/29/21 1156 04/29/21 1336  BP:  132/70  Pulse:  92  Resp:  (!) 28  Temp:  98.2 F (36.8 C)  SpO2: 93% 93%    Intake/Output Summary (Last 24 hours) at 04/29/2021 1403 Last data filed at 04/29/2021 0427 Gross per 24 hour  Intake 534.54 ml  Output 200 ml  Net 334.54 ml   Filed Weights   04/28/21 1607  Weight: 112.3 kg   Weight change:  Body mass index is 33.58 kg/m.   Physical Exam: General exam: pleasant, in mild to moderate respiratory distress Skin: No rashes, lesions or ulcers. HEENT: Atraumatic, normocephalic, no obvious bleeding Lungs: Scattered bilateral crackles CVS: Regular tachycardia, no murmur GI/Abd soft, mild diffuse tenderness, bowel sound present. CNS: alert, awake, orientedX3 Psychiatry: Anxious Extremities: Bilateral edema improved today, no calf tenderness  Data Review: I have personally reviewed the laboratory data and studies available.  F/u labs ordered. Unresulted Labs (From admission, onward)     Start     Ordered   04/30/21 0500  Procalcitonin  Daily,   R      04/29/21 1355   04/29/21 1356  Procalcitonin - Baseline  Add-on,   AD        04/29/21 1355   04/28/21 9735  Basic metabolic panel  Daily,   R      04/27/21 1822            Signed, Terrilee Croak, MD [facility] 04/29/2021

## 2021-04-29 NOTE — Significant Event (Signed)
Rapid Response Event Note   Reason for Call :  Dyspnea  Initial Focused Assessment:  Called by the floor RN stating that this patient was having increased dyspnea. RN stated that the patient pulled her in at 1158 stating that she couldn't breathe. RN increased her oxygen from 2--> 4 ---> 6L [place] and finally placed her on NRB. Oxygen saturation is in the high 90's. Patient has a very audible rattle when she breathes despite being given lasix.   Patient had thoracentesis yesterday, 04/28/21, with 1.55L pulled off. DG Chest shows worsening of lung aeration with increased airspace opacities in right and left lung. Also seen with persistent right pleural effusion.   Patient is becoming more confused rt worsening hypoxia. She was eating food that wasn't there and asking me to take something out of her refrigerator.    Plan of Care:  Patient to be transferred to 39M-10 for intubation.   Event Summary:   MD Notified: Dr. Riccardo Dubin NP Call Time: 3968 Arrival Time: 8648 End Time: [place], RN

## 2021-04-29 NOTE — Progress Notes (Signed)
An USGPIV (ultrasound guided PIV) has been placed for short-term vasopressor infusion. A correctly placed ivWatch must be used when administering Vasopressors. Should this treatment be needed beyond 72 hours, central line access should be obtained.  It will be the responsibility of the bedside nurse to follow best practice to prevent extravasations.   ?

## 2021-04-29 NOTE — Progress Notes (Signed)
Pharmacy Antibiotic Note  Courtney Braun is a 71 y.o. female admitted on 04/27/2021. Pharmacy has been consulted for cefepime and vancomycin dosing for PNA.   Plan: Cefepime 2g q8h  Vancomycin 2567m x1 loading dose followed by vancomycin 17583mIV q24h (eAUC 490 using scr 0.81 and Vd 0.5) Monitor renal function, cultures, and clinical progression Follow up MRSA PCR  Height: 6' (182.9 cm) Weight: 112.3 kg (247 lb 9.2 oz) IBW/kg (Calculated) : 73.1  Temp (24hrs), Avg:98.5 F (36.9 C), Min:98.2 F (36.8 C), Max:98.9 F (37.2 C)  Recent Labs  Lab 04/27/21 1229 04/27/21 1255 04/28/21 0329 04/29/21 0222  WBC 12.0*  --   --  16.9*  CREATININE 0.74 0.70 0.75 0.81    Estimated Creatinine Clearance: 86.7 mL/min (by C-G formula based on SCr of 0.81 mg/dL).    Allergies  Allergen Reactions   Metformin And Related     Upset stomach   Voltaren [Diclofenac]     diarrhea    Antimicrobials this admission: Cefepime 2/11 >>  Vancomycin 2/11 >>  Dose adjustments this admission:   Microbiology results: 2/10 pleural: ngtd  2/11 bcx:  2/11 resp: MRSA PCR:  Thank you for allowing pharmacy to be a part of this patients care.  GrCristela FeltPharmD, BCPS Clinical Pharmacist 04/29/2021 3:09 PM

## 2021-04-29 NOTE — Progress Notes (Signed)
°   04/29/21 1154  Vitals  Temp 98.2 F (36.8 C)  Temp Source Oral  BP 128/70  MAP (mmHg) 85  BP Location Left Arm  BP Method Automatic  Patient Position (if appropriate) Lying  Pulse Rate 84  Pulse Rate Source Monitor  Resp (!) 24  MEWS COLOR  MEWS Score Color Green  Oxygen Therapy  SpO2 94 %  O2 Device Nasal Cannula  O2 Flow Rate (L/min) 8 L/min  Complaints & Interventions  Complains of Anxiety;Shortness of breath;Restless  MEWS Score  MEWS Temp 0  MEWS Systolic 0  MEWS Pulse 0  MEWS RR 1  MEWS LOC 0  MEWS Score 1  Provider Notification  Provider Name/Title Dr. Pietro Cassis  Date Provider Notified 04/29/21  Time Provider Notified 1154  Notification Type Call  Notification Reason Change in status (patient c/o anxiety, fidgeting, restless, with O2 sat of 83% on 4L [place])  Provider response No new orders  Date of Provider Response 04/29/21  Time of Provider Response 1154  Note  Observations patient dyspneic and complaining of anxiety

## 2021-04-30 ENCOUNTER — Inpatient Hospital Stay (HOSPITAL_COMMUNITY): Payer: Medicare HMO

## 2021-04-30 ENCOUNTER — Other Ambulatory Visit (HOSPITAL_COMMUNITY): Payer: Medicare HMO

## 2021-04-30 DIAGNOSIS — K7581 Nonalcoholic steatohepatitis (NASH): Secondary | ICD-10-CM | POA: Diagnosis not present

## 2021-04-30 DIAGNOSIS — R6521 Severe sepsis with septic shock: Secondary | ICD-10-CM

## 2021-04-30 DIAGNOSIS — A419 Sepsis, unspecified organism: Principal | ICD-10-CM

## 2021-04-30 DIAGNOSIS — E8809 Other disorders of plasma-protein metabolism, not elsewhere classified: Secondary | ICD-10-CM

## 2021-04-30 DIAGNOSIS — J948 Other specified pleural conditions: Secondary | ICD-10-CM | POA: Diagnosis not present

## 2021-04-30 DIAGNOSIS — Z01810 Encounter for preprocedural cardiovascular examination: Secondary | ICD-10-CM | POA: Diagnosis not present

## 2021-04-30 DIAGNOSIS — J9601 Acute respiratory failure with hypoxia: Secondary | ICD-10-CM | POA: Diagnosis not present

## 2021-04-30 DIAGNOSIS — K7460 Unspecified cirrhosis of liver: Secondary | ICD-10-CM | POA: Diagnosis not present

## 2021-04-30 LAB — ECHOCARDIOGRAM COMPLETE
AR max vel: 2.7 cm2
AV Area VTI: 2.66 cm2
AV Area mean vel: 2.67 cm2
AV Mean grad: 10 mmHg
AV Peak grad: 18.3 mmHg
Ao pk vel: 2.14 m/s
Area-P 1/2: 3.42 cm2
Height: 65.5 in
S' Lateral: 4.1 cm
Weight: 3890.68 oz

## 2021-04-30 LAB — POCT I-STAT 7, (LYTES, BLD GAS, ICA,H+H)
Acid-Base Excess: 5 mmol/L — ABNORMAL HIGH (ref 0.0–2.0)
Bicarbonate: 31.5 mmol/L — ABNORMAL HIGH (ref 20.0–28.0)
Calcium, Ion: 1.29 mmol/L (ref 1.15–1.40)
HCT: 26 % — ABNORMAL LOW (ref 36.0–46.0)
Hemoglobin: 8.8 g/dL — ABNORMAL LOW (ref 12.0–15.0)
O2 Saturation: 97 %
Patient temperature: 98.1
Potassium: 3.9 mmol/L (ref 3.5–5.1)
Sodium: 142 mmol/L (ref 135–145)
TCO2: 33 mmol/L — ABNORMAL HIGH (ref 22–32)
pCO2 arterial: 53.2 mmHg — ABNORMAL HIGH (ref 32.0–48.0)
pH, Arterial: 7.379 (ref 7.350–7.450)
pO2, Arterial: 98 mmHg (ref 83.0–108.0)

## 2021-04-30 LAB — MAGNESIUM
Magnesium: 2 mg/dL (ref 1.7–2.4)
Magnesium: 2.1 mg/dL (ref 1.7–2.4)

## 2021-04-30 LAB — CBC
HCT: 31.3 % — ABNORMAL LOW (ref 36.0–46.0)
Hemoglobin: 10 g/dL — ABNORMAL LOW (ref 12.0–15.0)
MCH: 31.3 pg (ref 26.0–34.0)
MCHC: 31.9 g/dL (ref 30.0–36.0)
MCV: 97.8 fL (ref 80.0–100.0)
Platelets: UNDETERMINED 10*3/uL (ref 150–400)
RBC: 3.2 MIL/uL — ABNORMAL LOW (ref 3.87–5.11)
RDW: 16.9 % — ABNORMAL HIGH (ref 11.5–15.5)
WBC: 17.9 10*3/uL — ABNORMAL HIGH (ref 4.0–10.5)
nRBC: 0 % (ref 0.0–0.2)

## 2021-04-30 LAB — BASIC METABOLIC PANEL
Anion gap: 8 (ref 5–15)
BUN: 38 mg/dL — ABNORMAL HIGH (ref 8–23)
CO2: 26 mmol/L (ref 22–32)
Calcium: 8.8 mg/dL — ABNORMAL LOW (ref 8.9–10.3)
Chloride: 106 mmol/L (ref 98–111)
Creatinine, Ser: 1.09 mg/dL — ABNORMAL HIGH (ref 0.44–1.00)
GFR, Estimated: 54 mL/min — ABNORMAL LOW (ref >60)
Glucose, Bld: 138 mg/dL — ABNORMAL HIGH (ref 70–99)
Potassium: 4.2 mmol/L (ref 3.5–5.1)
Sodium: 140 mmol/L (ref 135–145)

## 2021-04-30 LAB — HEPATIC FUNCTION PANEL
ALT: 23 U/L (ref 0–44)
AST: 39 U/L (ref 15–41)
Albumin: 2.9 g/dL — ABNORMAL LOW (ref 3.5–5.0)
Alkaline Phosphatase: 62 U/L (ref 38–126)
Bilirubin, Direct: 0.5 mg/dL — ABNORMAL HIGH (ref 0.0–0.2)
Indirect Bilirubin: 1.8 mg/dL — ABNORMAL HIGH (ref 0.3–0.9)
Total Bilirubin: 2.3 mg/dL — ABNORMAL HIGH (ref 0.3–1.2)
Total Protein: 5.9 g/dL — ABNORMAL LOW (ref 6.5–8.1)

## 2021-04-30 LAB — GLUCOSE, CAPILLARY
Glucose-Capillary: 138 mg/dL — ABNORMAL HIGH (ref 70–99)
Glucose-Capillary: 127 mg/dL — ABNORMAL HIGH (ref 70–99)
Glucose-Capillary: 116 mg/dL — ABNORMAL HIGH (ref 70–99)
Glucose-Capillary: 120 mg/dL — ABNORMAL HIGH (ref 70–99)
Glucose-Capillary: 135 mg/dL — ABNORMAL HIGH (ref 70–99)
Glucose-Capillary: 153 mg/dL — ABNORMAL HIGH (ref 70–99)

## 2021-04-30 LAB — PHOSPHORUS
Phosphorus: 3.9 mg/dL (ref 2.5–4.6)
Phosphorus: 4.8 mg/dL — ABNORMAL HIGH (ref 2.5–4.6)

## 2021-04-30 LAB — PROTIME-INR
INR: 2 — ABNORMAL HIGH (ref 0.8–1.2)
Prothrombin Time: 22.3 seconds — ABNORMAL HIGH (ref 11.4–15.2)

## 2021-04-30 LAB — PROCALCITONIN: Procalcitonin: 0.47 ng/mL

## 2021-04-30 LAB — LACTIC ACID, PLASMA: Lactic Acid, Venous: 1.2 mmol/L (ref 0.5–1.9)

## 2021-04-30 MED ORDER — IOHEXOL 350 MG/ML SOLN
100.0000 mL | Freq: Once | INTRAVENOUS | Status: AC | PRN
  Administered 2021-04-30: 100 mL via INTRAVENOUS

## 2021-04-30 MED ORDER — VANCOMYCIN HCL 1000 MG/200ML IV SOLN
1000.0000 mg | INTRAVENOUS | Status: DC
  Administered 2021-04-30: 1000 mg via INTRAVENOUS
  Filled 2021-04-30 (×4): qty 200

## 2021-04-30 MED ORDER — SODIUM CHLORIDE 0.9 % IV SOLN
2.0000 g | Freq: Two times a day (BID) | INTRAVENOUS | Status: DC
  Administered 2021-04-30 – 2021-05-03 (×6): 2 g via INTRAVENOUS
  Filled 2021-04-30 (×6): qty 2

## 2021-04-30 MED ORDER — PANTOPRAZOLE 2 MG/ML SUSPENSION
40.0000 mg | Freq: Every day | ORAL | Status: DC
  Administered 2021-04-30 – 2021-05-03 (×4): 40 mg
  Filled 2021-04-30 (×5): qty 20

## 2021-04-30 NOTE — Progress Notes (Signed)
Pharmacy Antibiotic Note  Courtney Braun is a 71 y.o. female admitted on 04/27/2021. Pharmacy has been consulted for cefepime and vancomycin dosing for PNA.   Plan: Adjust cefepime to 2g q12h Adjust vancomycin to 1084m q24h (eAUC 469 using scr 1.09 and vd 0.5) Monitor renal function, cultures, and clinical progression Follow up MRSA PCR  Height: 5' 5.5" (166.4 cm) Weight: 110.3 kg (243 lb 2.7 oz) IBW/kg (Calculated) : 58.15  Temp (24hrs), Avg:98.4 F (36.9 C), Min:98 F (36.7 C), Max:98.9 F (37.2 C)  Recent Labs  Lab 04/27/21 1229 04/27/21 1255 04/28/21 0329 04/29/21 0222 04/29/21 1527 04/30/21 0718  WBC 12.0*  --   --  16.9*  --  17.9*  CREATININE 0.74 0.70 0.75 0.81  --  1.09*  LATICACIDVEN  --   --   --   --  1.6 1.2     Estimated Creatinine Clearance: 57.3 mL/min (A) (by C-G formula based on SCr of 1.09 mg/dL (H)).    Allergies  Allergen Reactions   Metformin And Related     Upset stomach   Voltaren [Diclofenac]     diarrhea    Antimicrobials this admission: Cefepime 2/11 >>  Vancomycin 2/11 >>  Dose adjustments this admission:   Microbiology results: 2/10 pleural: ngtd  2/11 bcx:  2/11 resp: MRSA PCR:  Thank you for allowing pharmacy to be a part of this patients care.  GCristela Felt PharmD, BCPS Clinical Pharmacist 04/30/2021 9:28 AM

## 2021-04-30 NOTE — Progress Notes (Addendum)
Subjective: Interval events noted. Since I saw the patient yesterday morning, she developed worsening respiratory distress and had to be intubated. Currently remains intubated and sedated.  Objective: Vital signs in last 24 hours: Temp:  [98 F (36.7 C)-98.9 F (37.2 C)] 98.3 F (36.8 C) (02/12 1122) Pulse Rate:  [66-90] 71 (02/12 1300) Resp:  [19-31] 26 (02/12 1300) BP: (84-127)/(39-93) 105/45 (02/12 1300) SpO2:  [89 %-100 %] 95 % (02/12 1300) FiO2 (%):  [40 %-100 %] 40 % (02/12 1102) Weight:  [110.3 kg] 110.3 kg (02/12 0500) Weight change: -2 kg Last BM Date: 04/29/21  PE: Intubated, sedated GENERAL: Not tachycardic or tachypneic, on tube feedings  ABDOMEN: Distended but soft, normoactive bowel sounds EXTREMITIES: No pedal edema  Lab Results: Results for orders placed or performed during the hospital encounter of 04/27/21 (from the past 48 hour(s))  Basic metabolic panel     Status: Abnormal   Collection Time: 04/29/21  2:22 AM  Result Value Ref Range   Sodium 139 135 - 145 mmol/L   Potassium 4.1 3.5 - 5.1 mmol/L   Chloride 104 98 - 111 mmol/L   CO2 26 22 - 32 mmol/L   Glucose, Bld 116 (H) 70 - 99 mg/dL    Comment: Glucose reference range applies only to samples taken after fasting for at least 8 hours.   BUN 26 (H) 8 - 23 mg/dL   Creatinine, Ser 0.81 0.44 - 1.00 mg/dL   Calcium 8.8 (L) 8.9 - 10.3 mg/dL   GFR, Estimated >60 >60 mL/min    Comment: (NOTE) Calculated using the CKD-EPI Creatinine Equation (2021)    Anion gap 9 5 - 15    Comment: Performed at [facility] [address], [place], [place] [address]  CBC with Differential/Platelet     Status: Abnormal   Collection Time: 04/29/21  2:22 AM  Result Value Ref Range   WBC 16.9 (H) 4.0 - 10.5 K/uL   RBC 3.20 (L) 3.87 - 5.11 MIL/uL   Hemoglobin 9.8 (L) 12.0 - 15.0 g/dL   HCT 30.9 (L) 36.0 - 46.0 %   MCV 96.6 80.0 - 100.0 fL   MCH 30.6 26.0 - 34.0 pg   MCHC 31.7 30.0 - 36.0 g/dL   RDW 16.7 (H) 11.5 -  15.5 %   Platelets 114 (L) 150 - 400 K/uL    Comment: Immature Platelet Fraction may be clinically indicated, consider ordering this additional test [id number] REPEATED TO VERIFY PLATELET COUNT CONFIRMED BY SMEAR    nRBC 0.0 0.0 - 0.2 %   Neutrophils Relative % 82 %   Neutro Abs 14.0 (H) 1.7 - 7.7 K/uL   Lymphocytes Relative 6 %   Lymphs Abs 1.0 0.7 - 4.0 K/uL   Monocytes Relative 10 %   Monocytes Absolute 1.6 (H) 0.1 - 1.0 K/uL   Eosinophils Relative 1 %   Eosinophils Absolute 0.1 0.0 - 0.5 K/uL   Basophils Relative 0 %   Basophils Absolute 0.1 0.0 - 0.1 K/uL   Immature Granulocytes 1 %   Abs Immature Granulocytes 0.09 (H) 0.00 - 0.07 K/uL    Comment: Performed at [facility] [address], [place], [place] [address]  Procalcitonin - Baseline     Status: None   Collection Time: 04/29/21  2:22 AM  Result Value Ref Range   Procalcitonin 0.24 ng/mL    Comment:        Interpretation: PCT (Procalcitonin) <= 0.5 ng/mL: Systemic infection (sepsis) is not  likely. Local bacterial infection is possible. (NOTE)       Sepsis PCT Algorithm           Lower Respiratory Tract                                      Infection PCT Algorithm    ----------------------------     ----------------------------         PCT < 0.25 ng/mL                PCT < 0.10 ng/mL          Strongly encourage             Strongly discourage   discontinuation of antibiotics    initiation of antibiotics    ----------------------------     -----------------------------       PCT 0.25 - 0.50 ng/mL            PCT 0.10 - 0.25 ng/mL               OR       >80% decrease in PCT            Discourage initiation of                                            antibiotics      Encourage discontinuation           of antibiotics    ----------------------------     -----------------------------         PCT >= 0.50 ng/mL              PCT 0.26 - 0.50 ng/mL               AND        <80% decrease in PCT              Encourage initiation of                                             antibiotics       Encourage continuation           of antibiotics    ----------------------------     -----------------------------        PCT >= 0.50 ng/mL                  PCT > 0.50 ng/mL               AND         increase in PCT                  Strongly encourage                                      initiation of antibiotics    Strongly encourage escalation           of antibiotics                                     -----------------------------  PCT <= 0.25 ng/mL                                                 OR                                        > 80% decrease in PCT                                      Discontinue / Do not initiate                                             antibiotics  Performed at [facility], [place] [address], [place], [place] [address]   MRSA Next Gen by PCR, Nasal     Status: Abnormal   Collection Time: 04/29/21  3:18 PM   Specimen: Nasal Mucosa; Nasal Swab  Result Value Ref Range   MRSA by PCR Next Gen DETECTED (A) NOT DETECTED    Comment: RESULT CALLED TO, READ BACK BY AND VERIFIED WITH: A,HAFNER @1800  04/29/21 EB (NOTE) The GeneXpert MRSA Assay (FDA approved for NASAL specimens only), is one component of a comprehensive MRSA colonization surveillance program. It is not intended to diagnose MRSA infection nor to guide or monitor treatment for MRSA infections. Test performance is not FDA approved in patients less than 32 years old. Performed at [facility], [place] [address], [place], [place] [address]   Culture, blood (routine x 2)     Status: None (Preliminary result)   Collection Time: 04/29/21  3:27 PM   Specimen: BLOOD  Result Value Ref Range   Specimen Description BLOOD SITE NOT SPECIFIED    Special Requests      BOTTLES DRAWN AEROBIC ONLY Blood Culture results may not be optimal due to an inadequate volume of  blood received in culture bottles   Culture      NO GROWTH < 24 HOURS Performed at [facility] [address], [place], [place] [address]    Report Status PENDING   Culture, blood (routine x 2)     Status: None (Preliminary result)   Collection Time: 04/29/21  3:27 PM   Specimen: BLOOD  Result Value Ref Range   Specimen Description BLOOD SITE NOT SPECIFIED    Special Requests      BOTTLES DRAWN AEROBIC ONLY Blood Culture results may not be optimal due to an inadequate volume of blood received in culture bottles   Culture      NO GROWTH < 24 HOURS Performed at [facility] [address] [place] [address] [place], [place] [address]    Report Status PENDING   Brain natriuretic peptide     Status: Abnormal   Collection Time: 04/29/21  3:27 PM  Result Value Ref Range   B Natriuretic Peptide 1,050.6 (H) 0.0 - 100.0 pg/mL    Comment: Performed at [facility] [address], [place], [place] [address]  D-dimer, quantitative     Status: Abnormal   Collection Time: 04/29/21  3:27 PM  Result Value Ref Range   D-Dimer, Quant 15.04 (H) 0.00 - 0.50 ug/mL-FEU    Comment: (NOTE) At the manufacturer cut-off value of 0.5 g/mL FEU, this assay has a negative predictive value of 95-100%.This assay is intended for use in conjunction with a clinical pretest probability (PTP) assessment model to exclude pulmonary embolism (PE) and deep venous thrombosis (DVT) in outpatients suspected of PE or DVT. Results should be correlated with clinical presentation. Performed at [facility], [place] [address], [place], [place] [address]   Lactic acid, plasma     Status: None   Collection Time: 04/29/21  3:27 PM  Result Value Ref Range   Lactic Acid, Venous 1.6 0.5 - 1.9 mmol/L    Comment: Performed at [facility] [address], [place], [place] [address]  Protime-INR     Status: Abnormal   Collection Time: 04/29/21  3:27 PM  Result Value Ref Range   Prothrombin Time 23.8 (H) 11.4 - 15.2  seconds   INR 2.1 (H) 0.8 - 1.2    Comment: (NOTE) INR goal varies based on device and disease states. Performed at [facility], [place] [address], [place], [place] [address]   Culture, Respiratory w Gram Stain     Status: None (Preliminary result)   Collection Time: 04/29/21  3:32 PM   Specimen: Tracheal Aspirate; Respiratory  Result Value Ref Range   Specimen Description TRACHEAL ASPIRATE    Special Requests NONE    Gram Stain      FEW WBC PRESENT,BOTH PMN AND MONONUCLEAR NO ORGANISMS SEEN    Culture      CULTURE REINCUBATED FOR BETTER GROWTH Performed at [facility], [place] [address] [place], [place] [address]    Report Status PENDING   Magnesium     Status: None   Collection Time: 04/29/21  4:28 PM  Result Value Ref Range   Magnesium 1.8 1.7 - 2.4 mg/dL    Comment: Performed at [facility], [place] [address], [place], [place] [address]  Phosphorus     Status: None   Collection Time: 04/29/21  4:28 PM  Result Value Ref Range   Phosphorus 4.1 2.5 - 4.6 mg/dL    Comment: Performed at [facility] [address] [place], [place] [address]  I-STAT 7, (LYTES, BLD GAS, ICA, H+H)     Status: Abnormal   Collection Time: 04/29/21  4:55 PM  Result Value Ref Range   pH, Arterial 7.322 (L) 7.350 - 7.450   pCO2 arterial 64.2 (H) 32.0 - 48.0 mmHg   pO2, Arterial 230 (H) 83.0 - 108.0 mmHg   Bicarbonate 33.3 (H) 20.0 - 28.0 mmol/L   TCO2 35 (H) 22 - 32 mmol/L   O2 Saturation 100.0 %   Acid-Base Excess 6.0 (H) 0.0 - 2.0 mmol/L   Sodium 142 135 - 145 mmol/L   Potassium 4.0 3.5 - 5.1 mmol/L   Calcium, Ion 1.28 1.15 - 1.40 mmol/L   HCT 28.0 (L) 36.0 - 46.0 %   Hemoglobin 9.5 (L) 12.0 - 15.0 g/dL   Patient temperature 98.2 F    Collection site RADIAL, ALLEN'S TEST ACCEPTABLE    Drawn by RT    Sample type ARTERIAL   Glucose, capillary     Status: Abnormal   Collection Time: 04/29/21  6:13 PM  Result Value Ref Range   Glucose-Capillary 124 (H) 70 - 99 mg/dL     Comment: Glucose reference  range applies only to samples taken after fasting for at least 8 hours.  Glucose, capillary     Status: Abnormal   Collection Time: 04/29/21  7:13 PM  Result Value Ref Range   Glucose-Capillary 120 (H) 70 - 99 mg/dL    Comment: Glucose reference range applies only to samples taken after fasting for at least 8 hours.  Glucose, capillary     Status: Abnormal   Collection Time: 04/29/21 11:22 PM  Result Value Ref Range   Glucose-Capillary 146 (H) 70 - 99 mg/dL    Comment: Glucose reference range applies only to samples taken after fasting for at least 8 hours.  Glucose, capillary     Status: Abnormal   Collection Time: 04/30/21  3:21 AM  Result Value Ref Range   Glucose-Capillary 138 (H) 70 - 99 mg/dL    Comment: Glucose reference range applies only to samples taken after fasting for at least 8 hours.  I-STAT 7, (LYTES, BLD GAS, ICA, H+H)     Status: Abnormal   Collection Time: 04/30/21  3:55 AM  Result Value Ref Range   pH, Arterial 7.379 7.350 - 7.450   pCO2 arterial 53.2 (H) 32.0 - 48.0 mmHg   pO2, Arterial 98 83.0 - 108.0 mmHg   Bicarbonate 31.5 (H) 20.0 - 28.0 mmol/L   TCO2 33 (H) 22 - 32 mmol/L   O2 Saturation 97.0 %   Acid-Base Excess 5.0 (H) 0.0 - 2.0 mmol/L   Sodium 142 135 - 145 mmol/L   Potassium 3.9 3.5 - 5.1 mmol/L   Calcium, Ion 1.29 1.15 - 1.40 mmol/L   HCT 26.0 (L) 36.0 - 46.0 %   Hemoglobin 8.8 (L) 12.0 - 15.0 g/dL   Patient temperature 98.1 F    Collection site RADIAL, ALLEN'S TEST ACCEPTABLE    Drawn by RT    Sample type ARTERIAL   Basic metabolic panel     Status: Abnormal   Collection Time: 04/30/21  7:18 AM  Result Value Ref Range   Sodium 140 135 - 145 mmol/L   Potassium 4.2 3.5 - 5.1 mmol/L   Chloride 106 98 - 111 mmol/L   CO2 26 22 - 32 mmol/L   Glucose, Bld 138 (H) 70 - 99 mg/dL    Comment: Glucose reference range applies only to samples taken after fasting for at least 8 hours.   BUN 38 (H) 8 - 23 mg/dL   Creatinine, Ser  1.09 (H) 0.44 - 1.00 mg/dL   Calcium 8.8 (L) 8.9 - 10.3 mg/dL   GFR, Estimated 54 (L) >60 mL/min    Comment: (NOTE) Calculated using the CKD-EPI Creatinine Equation (2021)    Anion gap 8 5 - 15    Comment: Performed at [facility] [address] [place], [place] [address]  Procalcitonin     Status: None   Collection Time: 04/30/21  7:18 AM  Result Value Ref Range   Procalcitonin 0.47 ng/mL    Comment:        Interpretation: PCT (Procalcitonin) <= 0.5 ng/mL: Systemic infection (sepsis) is not likely. Local bacterial infection is possible. (NOTE)       Sepsis PCT Algorithm           Lower Respiratory Tract                                      Infection PCT Algorithm    ----------------------------     ----------------------------  PCT < 0.25 ng/mL                PCT < 0.10 ng/mL          Strongly encourage             Strongly discourage   discontinuation of antibiotics    initiation of antibiotics    ----------------------------     -----------------------------       PCT 0.25 - 0.50 ng/mL            PCT 0.10 - 0.25 ng/mL               OR       >80% decrease in PCT            Discourage initiation of                                            antibiotics      Encourage discontinuation           of antibiotics    ----------------------------     -----------------------------         PCT >= 0.50 ng/mL              PCT 0.26 - 0.50 ng/mL               AND        <80% decrease in PCT             Encourage initiation of                                             antibiotics       Encourage continuation           of antibiotics    ----------------------------     -----------------------------        PCT >= 0.50 ng/mL                  PCT > 0.50 ng/mL               AND         increase in PCT                  Strongly encourage                                      initiation of antibiotics    Strongly encourage escalation           of antibiotics                                      -----------------------------                                           PCT <= 0.25 ng/mL  OR                                        > 80% decrease in PCT                                      Discontinue / Do not initiate                                             antibiotics  Performed at [facility], [place] [address] [place], [place] [address]   CBC     Status: Abnormal   Collection Time: 04/30/21  7:18 AM  Result Value Ref Range   WBC 17.9 (H) 4.0 - 10.5 K/uL   RBC 3.20 (L) 3.87 - 5.11 MIL/uL   Hemoglobin 10.0 (L) 12.0 - 15.0 g/dL   HCT 31.3 (L) 36.0 - 46.0 %   MCV 97.8 80.0 - 100.0 fL   MCH 31.3 26.0 - 34.0 pg   MCHC 31.9 30.0 - 36.0 g/dL   RDW 16.9 (H) 11.5 - 15.5 %   Platelets PLATELET CLUMPS NOTED ON SMEAR, UNABLE TO ESTIMATE 150 - 400 K/uL   nRBC 0.0 0.0 - 0.2 %    Comment: Performed at [facility] [address], [place], [place] [address]  Lactic acid, plasma     Status: None   Collection Time: 04/30/21  7:18 AM  Result Value Ref Range   Lactic Acid, Venous 1.2 0.5 - 1.9 mmol/L    Comment: Performed at [facility] [address], [place], [place] [address]  Protime-INR     Status: Abnormal   Collection Time: 04/30/21  7:18 AM  Result Value Ref Range   Prothrombin Time 22.3 (H) 11.4 - 15.2 seconds   INR 2.0 (H) 0.8 - 1.2    Comment: (NOTE) INR goal varies based on device and disease states. Performed at [facility], [place] [address], [place], [place] [address]   Magnesium     Status: None   Collection Time: 04/30/21  7:18 AM  Result Value Ref Range   Magnesium 2.0 1.7 - 2.4 mg/dL    Comment: Performed at [facility] [address], [place], [place] [address]  Phosphorus     Status: None   Collection Time: 04/30/21  7:18 AM  Result Value Ref Range   Phosphorus 3.9 2.5 - 4.6 mg/dL    Comment: Performed at [facility] [address] [place],   [address]  Hepatic function panel     Status: Abnormal   Collection Time: 04/30/21  7:18 AM  Result Value Ref Range   Total Protein 5.9 (L) 6.5 - 8.1 g/dL   Albumin 2.9 (L) 3.5 - 5.0 g/dL   AST 39 15 - 41 U/L   ALT 23 0 - 44 U/L   Alkaline Phosphatase 62 38 - 126 U/L   Total Bilirubin 2.3 (H) 0.3 - 1.2 mg/dL   Bilirubin, Direct 0.5 (H) 0.0 - 0.2 mg/dL   Indirect Bilirubin 1.8 (H) 0.3 - 0.9 mg/dL    Comment: Performed at [facility] [address], [place], [place]  [address]  Glucose, capillary     Status: Abnormal   Collection Time: 04/30/21  7:25 AM  Result Value Ref Range   Glucose-Capillary 127 (H) 70 - 99 mg/dL    Comment: Glucose reference range applies only to samples taken after fasting for at least 8 hours.  Glucose, capillary     Status: Abnormal   Collection Time: 04/30/21 11:15 AM  Result Value Ref Range   Glucose-Capillary 116 (H) 70 - 99 mg/dL    Comment: Glucose reference range applies only to samples taken after fasting for at least 8 hours.    Studies/Results: DG Abd 1 View  Result Date: 04/29/2021 CLINICAL DATA:  Evaluate OG tube placement. EXAM: ABDOMEN - 1 VIEW COMPARISON:  None. FINDINGS: The enteric tube courses below the level of the GE junction with tip projecting over the body of stomach. The side port of the enteric tube is well below the GE junction. Gallstones are incidentally identified within the right upper quadrant of the abdomen measuring up to 8 mm. IMPRESSION: Enteric tube tip projects over the body of the stomach. Electronically Signed   By: Kerby Moors M.D.   On: 04/29/2021 16:28   US Abdomen Limited  Result Date: 04/29/2021 CLINICAL DATA:  Ascites EXAM: LIMITED ABDOMEN ULTRASOUND FOR ASCITES TECHNIQUE: Limited ultrasound survey for ascites was performed in all four abdominal quadrants. COMPARISON:  None. FINDINGS: No significant ascites was identified. IMPRESSION: No significant ascites.  Paracentesis canceled. Electronically Signed   By:  Miachel Roux M.D.   On: 04/29/2021 10:42   DG CHEST PORT 1 VIEW  Result Date: 04/29/2021 CLINICAL DATA:  Intubation EXAM: PORTABLE CHEST 1 VIEW COMPARISON:  04/29/2021 FINDINGS: Interval placement of endotracheal tube with distal tip terminating approximately 4.2 cm above the carina. Enteric tube courses below the diaphragm with distal tip beyond the inferior margin of the film. Stable cardiomegaly. Aortic atherosclerosis. Persistent right-sided pleural effusion. Extensive heterogeneous airspace opacities throughout both lungs, slightly improved within the right lung field. No pneumothorax. IMPRESSION: 1. Appropriate positioning of ET and enteric tubes. 2. Extensive heterogeneous airspace opacities throughout both lungs, slightly improved within the right lung field. Electronically Signed   By: Davina Poke D.O.   On: 04/29/2021 16:31   DG Chest Port 1V same Day  Result Date: 04/29/2021 CLINICAL DATA:  Shortness of breath. EXAM: PORTABLE CHEST 1 VIEW COMPARISON:  04/28/2021 and older studies.  CT, 04/27/2021 FINDINGS: Since the previous day's study, airspace opacities have increased on the right, now noted throughout the right perihilar region into the central right upper lobe. Left-sided airspace opacities are similar to the prior exam. Right pleural effusion is likely unchanged, less defined due to the semi-erect positioning. No convincing left pleural effusion.  No pneumothorax. Stable cardiac silhouette. IMPRESSION: 1. Interval worsening of lung aeration with increased airspace opacities in the right mid and upper lung, and stable airspace opacities in the left lung. Findings suspect the multifocal pneumonia. 2. Persistent right pleural effusion. Electronically Signed   By: Lajean Manes M.D.   On: 04/29/2021 14:14   CT Angio Abd/Pel w/ and/or w/o  Result Date: 04/30/2021 CLINICAL DATA:  Portal hypertension. Evaluate anatomy for possible tips. EXAM: CTA ABDOMEN AND PELVIS WITHOUT AND WITH  CONTRAST TECHNIQUE: Multidetector CT imaging of the abdomen and pelvis was performed using the standard protocol during bolus administration of intravenous contrast. Multiplanar reconstructed images and MIPs were obtained and reviewed to evaluate the vascular anatomy. RADIATION DOSE REDUCTION: This exam was performed according to the  departmental dose-optimization program which includes automated exposure control, adjustment of the mA and/or kV according to patient size and/or use of iterative reconstruction technique. CONTRAST:  141m OMNIPAQUE IOHEXOL 350 MG/ML SOLN COMPARISON:  CT scan of the chest 04/27/2021; CT abdomen/pelvis 08/03/2016 FINDINGS: VASCULAR Aorta: Mild calcifications throughout the abdominal aorta. No evidence of aneurysm or dissection. Celiac: Patent at the origin. Conventional hepatic arterial branching pattern. 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 The smaller measures 0.7 cm (image 48 series 4) the larger measures 0.9 cm (image 57 of series 4). There is some abnormal arterial phase enhancement tracking along the main portal vein in the porta hepatis and extending along the right anterior division of the intrahepatic portal vein. Findings are best demonstrated on the coronal reformatted images. Given findings of portal venous thrombosis, this could represent altered arterial flow dynamics. SMA: Patent without evidence of aneurysm, dissection, vasculitis or significant stenosis. Renals: Both renal arteries are patent without evidence of aneurysm, dissection, vasculitis, fibromuscular dysplasia or significant stenosis. IMA: Patent without evidence of aneurysm, dissection, vasculitis or significant stenosis. Inflow: Patent without evidence of aneurysm, dissection, vasculitis or significant stenosis. Proximal Outflow: Bilateral common femoral and visualized portions of the superficial and profunda femoral arteries are patent without  evidence of aneurysm, dissection, vasculitis or significant stenosis. Veins: The main portal vein is small in caliber. There is evidence of some chronic thrombus within the main portal vein as it enters the liver. The posterior right division of the portal vein appears chronically thrombosed with mild cavernous transformation. The left intrahepatic portal vein is not well seen. There is a recanalized paraumbilical vein. Massive gastro renal shunt providing supply to a large network of gastric varices. The primary afferent artery from the portal system is the left gastric artery. Additionally, there are short gastric contributors arising from the splenic hilum. The left renal vein is significantly dilated due to the increased flow. Review of the MIP images confirms the above findings. NON-VASCULAR Lower chest: Large right and small left layering pleural effusions. Additionally, there is significant multifocal airspace opacity in a peribronchovascular distribution throughout the remaining lungs bilaterally with small areas of air bronchogram in the lungs. The intracardiac blood pool is hypodense relative to the adjacent myocardium consistent with anemia. No pericardial effusion. Hepatobiliary: Small, nodular cirrhotic liver. No definite discrete enhancing hepatic mass. Stones are present within the gallbladder lumen. Mild gallbladder wall thickening which is not unexpected in the setting of cirrhosis. No biliary ductal dilatation. Pancreas: Unremarkable. No pancreatic ductal dilatation or surrounding inflammatory changes. Spleen: Splenomegaly. Adrenals/Urinary Tract: Unremarkable adrenal glands. No enhancing renal mass, hydronephrosis or nephrolithiasis. Unremarkable ureters and bladder. Stomach/Bowel: No focal bowel wall thickening or evidence of obstruction. Gastric tube in place. Gastric varices as above. Lymphatic: No suspicious lymphadenopathy. Reproductive: Uterus and bilateral adnexa are unremarkable. Other:  Small volume ascites in the pelvic cul-de-sac. Musculoskeletal: No acute or significant osseous findings. IMPRESSION: 1. Hepatic cirrhosis complicated by portal hypertension with gastric varices. 2. Massive gastro renal shunt resulting in asymmetric dilation of the left renal vein due to the diverted portosystemic blood flow. Primary afferent vein appears to be the left gastric vein. Additionally, there are short gastric contributors. 3. 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 4. Atypical arterial enhancement pattern along the main portal vein extending along the anterior division of the right portal vein favored to reflect altered arterial flow pattern in the  setting of portal venous occlusion. No convincing evidence of intrahepatic mass to suggest that the enhancement along the portal vein represents tumor thrombus. 5. Significant bilateral diffuse lung opacities in a peribronchovascular distribution concerning for multifocal pneumonia including atypical/viral/COVID pneumonia. 6. Right larger than left pleural effusions may be due to the underlying pulmonary process, or reflect hepatic hydrothorax related to the underlying cirrhosis and portal hypertension. 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 7. Small splenic artery aneurysms at the splenic hilum measuring less than 1 cm. 8.  Aortic Atherosclerosis (ICD10-I70.0). 9. Small volume ascites. 10. Cholelithiasis. Signed, Criselda Peaches, MD, [facility] Electronically Signed   By: Jacqulynn Cadet M.D.   On: 04/30/2021 07:33    Medications: I have reviewed the patient's current medications.  Assessment: Decompensated cirrhosis,?  Related to NASH MELDNa today 18  Hepatic hydrothorax, status post thoracocentesis, significant bilateral pleural effusions, right greater than left On  furosemide 40 mg IV twice a day and spironolactone 100 mg via tube daily  Respiratory failure  Septic shock  Worsening coagulopathy-PT/INR 22.3/2  History of hepatic encephalopathy-on lactulose 30 g twice a day and Xifaxan 550 mg twice a day  Normocytic anemia, mild thrombocytopenia Renal impairment, BUN worsened to 38, creatinine worsened to 1.09, GFR reduced to 54  Status post IR evaluation for TIPS/BRTO-gastric varices with massive splenorenal shunt, chronic thrombosis of main portal vein  Plan: As per IR, the patient is a poor candidate for TIPS and is at high risk of thrombosis in absence of patent portal vein BRTO could significantly compromise her spleen and bowel  Overall poor prognosis  This is an incurable condition and patient is not a candidate for TIPS or BRTO . Recommend palliative care/hospice evaluation.  Patient may need tunneled chest pleur X catheter for palliation  Ronnette Juniper, MD 04/30/2021, 1:48 PM

## 2021-04-30 NOTE — Progress Notes (Signed)
°  Echocardiogram 2D Echocardiogram has been performed.  Courtney Braun 04/30/2021, 4:05 PM

## 2021-04-30 NOTE — Progress Notes (Signed)
Patient was transported to CT and back to 2M10 via the ventilator with no complications.

## 2021-04-30 NOTE — Progress Notes (Signed)
NAME:  Courtney Braun, MRN:  [id number], DOB:  1950-08-09, LOS: 3 ADMISSION DATE:  04/27/2021, CONSULTATION DATE: 04/29/2021 REFERRING MD: Dr. Pietro Cassis, CHIEF COMPLAINT: Acute respiratory failure  History of Present Illness:  71 year old woman with a history of Courtney Braun cirrhosis complicated by esophageal varices, chronic anemia and right hepatic hydrothorax.  Also with a history of diabetes, hypertension, hyperlipidemia. Admitted 2/9 with progressive abdominal distention and associated dyspnea.  She has had an overall decline in her health over about 1 year.  Diuretic dosing has been adjusted as an outpatient but symptoms continue to progress.  Admitted for further care.  She underwent a right thoracentesis on 2/10.  Per RN she was doing well on 4 L/min morning of 2/11, then developed a sudden decline in respiratory status, more dyspnea, more hypoxemia and increased cough with yellow secretions.  Now on 15 L/min.  Chest x-ray reviewed, shows increasing bilateral alveolar infiltrates, particularly on the right.  The right pleural effusion is smaller than at presentation, has not increased significantly since thoracentesis.  Lasix given, empiric ceftriaxone started.  PCCM consulted to eval. She will move to ICU for further care  Pertinent  Medical History   Past Medical History:  Diagnosis Date   Anemia    Diabetes mellitus without complication ([place])    type II   History of blood transfusion    Hyperlipidemia    Hypertension     Significant Hospital Events: Including procedures, antibiotic start and stop dates in addition to other pertinent events   CT chest 2/9 >> large right pleural effusion with compressive atelectasis of the right lower and right middle lobes.  Marked hepatic cirrhosis with severe portal hypertension and evidence for large perigastric and gastric varices.  Scattered patchy left upper lobe groundglass infiltrates Chest x-ray 2/11 >> progression of bilateral alveolar infiltrates,  no real increase in size of pleural effusions since thoracentesis Pleural fluid 2/10 >> transudate.  WBC 97 (22 N, 57L, 21 M, 0E).  Gram stain negative TTE 2/11 >> CT Angio A/P for TIPS eval on 2/12 shows chronic portal venous thrombosis, hepatic hydrothorax, gastrorenal shunting, bilateral patchy airspace opacities consistent with pneumonia.  Interim History / Subjective:  This morning still on pressors Sedated, not responsive,    Objective   Blood pressure (!) 103/48, pulse 68, temperature 98 F (36.7 C), temperature source Axillary, resp. rate 19, height 5' 5.5" (1.664 m), weight 110.3 kg, SpO2 97 %.    Vent Mode: PRVC FiO2 (%):  [40 %-100 %] 50 % Set Rate:  [24 bmp-26 bmp] 26 bmp Vt Set:  [350 mL] 350 mL PEEP:  [5 cmH20-10 cmH20] 5 cmH20 Plateau Pressure:  [15 cmH20-21 cmH20] 17 cmH20   Intake/Output Summary (Last 24 hours) at 04/30/2021 1023 Last data filed at 04/30/2021 0800 Gross per 24 hour  Intake 2442.06 ml  Output 1150 ml  Net 1292.06 ml   Filed Weights   04/28/21 1607 04/30/21 0500  Weight: 112.3 kg 110.3 kg    Examination: Gen:      Intubated, sedated, acutely ill appearing HEENT:  ETT to vent Lungs:    sounds of mechanical ventilation auscultated breath sounds diminished, no wheezes or crackles CV:         RRR no mrg Abd:      Soft, nontender Ext:    No edema, extremities thin Skin:      Warm and dry; no rashes Neuro:   sedated, RASS -2  Resolved Hospital Problem list  Assessment & Plan:  Acute hypoxemic respiratory failure Hepatic Hydrothorax Diffuse bilateral pulmonary infiltrates on CT concerning for CAP.  - procalcitonin lower, however given clinical situation would continue vanc/cefepime another day at least - ddimer elevatd in the setting of portal venous thrombosis - continue diuresis with lasix, aldactone - continue prn bronchodilators  Septic Shock - on low dose norepinephrine - titrate down as tolerated for goal MAP>65 - judicious  fluid and albumine use  Decompensated Courtney Braun cirrhosis.   With portal gastropathy, esophageal and gastric varices and hepatic hydrothorax - GI following, IR consulted for TIPS evaluation - continue diuresis with lasix and aldactone as ordered  Coagulopathy of liver disease -follow INR -consider FFP if she needs procedures  History of hepatic encephalopathy -On lactulose at home, continue -Continue rifaximin - hold bowel regimen given copious stool output  Chronic thrombocytopenia -following CBC -no indication transfusion at this time  Diabetes with hyperglycemia -start CBG's and SSI coverage   Best Practice (right click and "Reselect all SmartList Selections" daily)   Diet/type: NPO w/ meds via tube DVT prophylaxis: SCD GI prophylaxis: PPI Lines: N/A Foley:  N/A Code Status:  full code Last date of multidisciplinary goals of care discussion [discussed with patient 2/11. Niece updated at bedside 04/30/21]  The patient is critically ill due to septic shock, respiratory failure.  Critical care was necessary to treat or prevent imminent or life-threatening deterioration.  Critical care was time spent personally by me on the following activities: development of treatment plan with patient and/or surrogate as well as nursing, discussions with consultants, evaluation of patient's response to treatment, examination of patient, obtaining history from patient or surrogate, ordering and performing treatments and interventions, ordering and review of laboratory studies, ordering and review of radiographic studies, pulse oximetry, re-evaluation of patient's condition and participation in multidisciplinary rounds.   Critical Care Time devoted to patient care services described in this note is 40 minutes. This time reflects time of care of this [place] . This critical care time does not reflect separately billable procedures or procedure time, teaching time or supervisory time of  PA/NP/Med student/Med Resident etc but could involve care discussion time.       Spero Geralds [facility] 04/30/2021 10:23 AM  Pager: see AMION  If no response to pager , please call critical care on call (see AMION) until 7pm After 7:00 pm call Elink     Labs   CBC: Recent Labs  Lab 04/27/21 1229 04/27/21 1255 04/29/21 0222 04/29/21 1655 04/30/21 0355 04/30/21 0718  WBC 12.0*  --  16.9*  --   --  17.9*  NEUTROABS 9.9*  --  14.0*  --   --   --   HGB 11.6* 11.6* 9.8* 9.5* 8.8* 10.0*  HCT 34.5* 34.0* 30.9* 28.0* 26.0* 31.3*  MCV 93.5  --  96.6  --   --  97.8  PLT PLATELET CLUMPS NOTED ON SMEAR, UNABLE TO ESTIMATE  --  114*  --   --  PLATELET CLUMPS NOTED ON SMEAR, UNABLE TO ESTIMATE    Basic Metabolic Panel: Recent Labs  Lab 04/27/21 1229 04/27/21 1255 04/28/21 0329 04/29/21 0222 04/29/21 1628 04/29/21 1655 04/30/21 0355 04/30/21 0718  NA 139 137 138 139  --  142 142 140  K 4.0 3.9 3.9 4.1  --  4.0 3.9 4.2  CL 102 104 103 104  --   --   --  106  CO2 29  --  27 26  --   --   --  26  GLUCOSE 138* 135* 121* 116*  --   --   --  138*  BUN 20 21 20  26*  --   --   --  38*  CREATININE 0.74 0.70 0.75 0.81  --   --   --  1.09*  CALCIUM 8.6*  --  8.4* 8.8*  --   --   --  8.8*  MG  --   --   --   --  1.8  --   --  2.0  PHOS  --   --   --   --  4.1  --   --  3.9   GFR: Estimated Creatinine Clearance: 57.3 mL/min (A) (by C-G formula based on SCr of 1.09 mg/dL (H)). Recent Labs  Lab 04/27/21 1229 04/29/21 0222 04/29/21 1527 04/30/21 0718  PROCALCITON  --  0.24  --   --   WBC 12.0* 16.9*  --  17.9*  LATICACIDVEN  --   --  1.6 1.2    Liver Function Tests: Recent Labs  Lab 04/27/21 1229 04/30/21 0718  AST 63* 39  ALT 42 23  ALKPHOS 99 62  BILITOT 2.4* 2.3*  PROT 6.4* 5.9*  ALBUMIN 2.2* 2.9*   No results for input(s): LIPASE, AMYLASE in the last 168 hours. Recent Labs  Lab 04/27/21 1815  AMMONIA 30    ABG    Component  Value Date/Time   PHART 7.379 04/30/2021 0355   PCO2ART 53.2 (H) 04/30/2021 0355   PO2ART 98 04/30/2021 0355   HCO3 31.5 (H) 04/30/2021 0355   TCO2 33 (H) 04/30/2021 0355   O2SAT 97.0 04/30/2021 0355     Coagulation Profile: Recent Labs  Lab 04/27/21 1637 04/28/21 0329 04/29/21 1527 04/30/21 0718  INR 1.6* 1.7* 2.1* 2.0*    Cardiac Enzymes: No results for input(s): CKTOTAL, CKMB, CKMBINDEX, TROPONINI in the last 168 hours.  HbA1C: Hgb A1c MFr Bld  Date/Time Value Ref Range Status  04/28/2021 03:29 AM 4.3 (L) 4.8 - 5.6 % Final    Comment:    (NOTE) Pre diabetes:          5.7%-6.4%  Diabetes:              >6.4%  Glycemic control for   <7.0% adults with diabetes     CBG: Recent Labs  Lab 04/29/21 1813 04/29/21 1913 04/29/21 2322 04/30/21 0321 04/30/21 0725  GLUCAP 124* 120* 146* 138* 127*

## 2021-04-30 NOTE — Consult Note (Signed)
Chief Complaint: Patient was seen in consultation today for possible TIPS placement  Referring Physician(s): Ronnette Juniper, MD  Supervising Physician: Mir, Sharen Heck  Patient Status: [facility] - In-pt  History of Present Illness: Courtney Braun is a 71 y.o. female with a past medical history significant for anemia, HLD, HTN, DM, non-alcoholic cirrhosis secondary to NASH and bleeding esophageal varices (2018) who presented to [facility] ED on 04/27/21 with complaints of dyspnea and abdominal swelling x 1 month. Initial evaluation in the ED significant for tachycardia, hypertension with normal oxygen saturation. CT chest w/contrast showed a large right pleural effusion likely related to hepatic hydrothorax in setting of cirrhosis. She underwent a right thoracentesis in IR that same day which yielded 1.5 L of serosanguineous fluid, labs showed this to be transudate. Paracentesis was also requested however no peritoneal fluid was seen on ultrasound examination. She had been doing well on 4L/min of supplemental oxygen until the afternoon of 2/11 when she had a sudden decline in respiratory status which eventually required intubation and transfer to the ICU.   IR was consulted the morning of 2/11 for possible TIPS placement - patient underwent CTA abd/pelvis BRTO protocol early this morning for procedure planning which noted several significant abnormalities:  1. Hepatic cirrhosis complicated by portal hypertension with gastric varices. 2. Massive gastro renal shunt resulting in asymmetric dilation of the left renal vein due to the diverted portosystemic blood flow. Primary afferent vein appears to be the left gastric vein. Additionally, there are short gastric contributors. 3. 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 4. Atypical arterial enhancement pattern along the main portal vein extending along the anterior division of the right portal  vein favored to reflect altered arterial flow pattern in the setting of portal venous occlusion. No convincing evidence of intrahepatic mass to suggest that the enhancement along the portal vein represents tumor thrombus. 5. Significant bilateral diffuse lung opacities in a peribronchovascular distribution concerning for multifocal pneumonia including atypical/viral/COVID pneumonia. 6. Right larger than left pleural effusions may be due to the underlying pulmonary process, or reflect hepatic hydrothorax related to the underlying cirrhosis and portal hypertension. 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 7. Small splenic artery aneurysms at the splenic hilum measuring less than 1 cm. 8.  Aortic Atherosclerosis (ICD10-I70.0). 9. Small volume ascites. 10. Cholelithiasis.  Patient seen in ICU, intubated/sedated, no family/staff at bedside during exam. Currently on pressor support.  Past Medical History:  Diagnosis Date   Anemia    Diabetes mellitus without complication ([place])    type II   History of blood transfusion    Hyperlipidemia    Hypertension     Past Surgical History:  Procedure Laterality Date   ESOPHAGOGASTRODUODENOSCOPY N/A 08/06/2016   Procedure: ESOPHAGOGASTRODUODENOSCOPY (EGD);  Surgeon: Clarene Essex, MD;  Location: [facility];  Service: Endoscopy;  Laterality: N/A;   ESOPHAGOGASTRODUODENOSCOPY (EGD) WITH PROPOFOL N/A 05/09/2018   Procedure: ESOPHAGOGASTRODUODENOSCOPY (EGD) WITH PROPOFOL;  Surgeon: Clarene Essex, MD;  Location: [facility];  Service: Endoscopy;  Laterality: N/A;   ESOPHAGOGASTRODUODENOSCOPY (EGD) WITH PROPOFOL N/A 11/04/2018   Procedure: ESOPHAGOGASTRODUODENOSCOPY (EGD) WITH PROPOFOL;  Surgeon: Clarene Essex, MD;  Location: [facility];  Service: Endoscopy;  Laterality: N/A;   GASTRIC VARICES BANDING N/A 05/09/2018   Procedure: GASTRIC VARICES BANDING;  Surgeon: Clarene Essex, MD;  Location: [facility];   Service: Endoscopy;  Laterality: N/A;   IR THORACENTESIS ASP PLEURAL SPACE W/IMG GUIDE  04/28/2021   none     TONSILLECTOMY      Allergies: Metformin and related and Voltaren [diclofenac]  Medications: Prior to Admission medications   Medication Sig Start Date End Date Taking? Authorizing Provider  acetaminophen (TYLENOL) 500 MG tablet Take 1,000 mg by mouth every 6 (six) hours as needed for moderate pain or headache.   Yes [provider]  cetirizine (ZYRTEC) 10 MG tablet Take 10 mg by mouth daily as needed for allergies.   Yes [provider]  ferrous sulfate 325 (65 FE) MG tablet Take 325 mg by mouth daily with breakfast.   Yes [provider]  furosemide (LASIX) 20 MG tablet Take 20 mg by mouth daily. 03/29/21  Yes [provider]  Lactulose 20 GM/30ML SOLN Take 30-45 mLs by mouth 3 (three) times daily.   Yes [provider]  Multiple Vitamin (MULTIVITAMIN WITH MINERALS) TABS tablet Take 1 tablet by mouth daily.   Yes [provider]  ondansetron (ZOFRAN ODT) 8 MG disintegrating tablet Take 1 tablet (8 mg total) by mouth every 8 (eight) hours as needed for nausea or vomiting. 11/19/20  Yes Dorie Rank, MD  sertraline (ZOLOFT) 50 MG tablet Take 50 mg by mouth daily.   Yes [provider]  simvastatin (ZOCOR) 40 MG tablet Take 40 mg by mouth at bedtime.    Yes [provider]  spironolactone (ALDACTONE) 50 MG tablet Take 50 mg by mouth daily. 04/21/21  Yes [provider]  lisinopril-hydrochlorothiazide (ZESTORETIC) 20-12.5 MG tablet Take 1 tablet by mouth daily. Patient not taking: Reported on 04/28/2021 08/19/18   [provider]  propranolol (INDERAL) 40 MG tablet Take 20 mg by mouth 2 (two) times daily. Patient not taking: Reported on 04/28/2021 09/12/18   [provider]     Family History  Problem Relation Age of Onset   Hyperlipidemia Other     Social History   Socioeconomic History    Marital status: Widowed    Spouse name: Not on file   Number of children: Not on file   Years of education: Not on file   Highest education level: Not on file  Occupational History   Not on file  Tobacco Use   Smoking status: Never   Smokeless tobacco: Never  Vaping Use   Vaping Use: Never used  Substance and Sexual Activity   Alcohol use: Yes    Comment: occasionally   Drug use: No   Sexual activity: Not on file  Other Topics Concern   Not on file  Social History Narrative   Not on file   Social Determinants of Health   Financial Resource Strain: Not on file  Food Insecurity: Not on file  Transportation Needs: Not on file  Physical Activity: Not on file  Stress: Not on file  Social Connections: Not on file     Review of Systems: A 12 point ROS discussed and pertinent positives are indicated in the HPI above.  All other systems are negative.  Review of Systems  Unable to perform ROS: Intubated   Vital Signs: BP (!) 103/47 (BP Location: Right Arm)    Pulse 68    Temp 98 F (36.7 C) (Axillary)    Resp (!) 26    Ht 5' 5.5" (1.664 m)    Wt 243 lb 2.7 oz (110.3 kg)    SpO2 94%    BMI 39.85 kg/m   Physical Exam Vitals and nursing note reviewed.  Constitutional:  Comments: Sedated  HENT:     Head: Normocephalic.  Cardiovascular:     Rate and Rhythm: Normal rate and regular rhythm.  Pulmonary:     Comments: Intubated, ventilated Abdominal:     General: There is no distension.     Palpations: Abdomen is soft.  Skin:    General: Skin is warm and dry.     Coloration: Skin is not jaundiced.     Imaging: DG Chest 1 View  Result Date: 04/28/2021 CLINICAL DATA:  Status post right thoracentesis. EXAM: CHEST  1 VIEW COMPARISON:  Dated April 27, 2021 FINDINGS: Visualized cardiac and mediastinal contours are unchanged. Moderate right pleural effusion, decreased in size when compared with prior exam. No evidence of pneumothorax. Increased heterogeneous opacities of  the left hemithorax. No large pleural effusion or pneumothorax. IMPRESSION: 1. Moderate right pleural effusion, decreased in size when compared with prior exam. No evidence pneumothorax. 2. Increased heterogeneous opacities of the left hemithorax, concerning for infection or asymmetric pulmonary edema. Electronically Signed   By: Yetta Glassman M.D.   On: 04/28/2021 13:11   DG Chest 2 View  Result Date: 04/27/2021 CLINICAL DATA:  Shortness of breath EXAM: CHEST - 2 VIEW COMPARISON:  11/18/2020 FINDINGS: Interval development of large right-sided pleural effusion. Hazy opacity within the aerated portion of the right upper lobe. Left lung is clear. No left-sided pleural effusion. No pneumothorax. Grossly stable heart size. Aortic atherosclerosis. IMPRESSION: Interval development of large right-sided pleural effusion. Electronically Signed   By: Davina Poke D.O.   On: 04/27/2021 13:00   DG Abd 1 View  Result Date: 04/29/2021 CLINICAL DATA:  Evaluate OG tube placement. EXAM: ABDOMEN - 1 VIEW COMPARISON:  None. FINDINGS: The enteric tube courses below the level of the GE junction with tip projecting over the body of stomach. The side port of the enteric tube is well below the GE junction. Gallstones are incidentally identified within the right upper quadrant of the abdomen measuring up to 8 mm. IMPRESSION: Enteric tube tip projects over the body of the stomach. Electronically Signed   By: Kerby Moors M.D.   On: 04/29/2021 16:28   CT Chest W Contrast  Result Date: 04/27/2021 CLINICAL DATA:  A 71 year old female presents for evaluation of large RIGHT-sided pleural effusion. EXAM: CT CHEST WITH CONTRAST TECHNIQUE: Multidetector CT imaging of the chest was performed during intravenous contrast administration. RADIATION DOSE REDUCTION: This exam was performed according to the departmental dose-optimization program which includes automated exposure control, adjustment of the mA and/or kV according to patient  size and/or use of iterative reconstruction technique. CONTRAST:  144m OMNIPAQUE IOHEXOL 300 MG/ML  SOLN COMPARISON:  CT abdomen and pelvis from 2018. FINDINGS: Cardiovascular: Calcified atheromatous plaque of the thoracic aorta. No aneurysmal dilation. Central pulmonary vasculature is unremarkable on venous phase. Heart size top normal without pericardial effusion. Mediastinum/Nodes: No thoracic inlet, axillary, mediastinal or hilar adenopathy. Esophagus grossly normal. Lungs/Pleura: Large RIGHT-sided pleural effusion with complete collapse of the RIGHT lower lobe and middle lobe and partial collapse of the RIGHT upper lobe. Pleural effusion extends into the lower chest and is sub pulmonic as well as tracking along the dependent chest. Features may be mildly loculated based on appearance in the upper and lower chest as it extends anteriorly. Patchy areas of ground-glass attenuation in the LEFT chest are geographic, some areas measuring up to 4.2 x 3.9 cm. Some more nodular appearing areas (image 37/5) measuring between 20 and 18 mm when compared to other scattered areas  of more discrete nodularity. The airways to the LEFT chest are patent. Mild narrowing of airways to the RIGHT chest without gross endobronchial abnormality of large airways. No signs of dependent pleural nodularity. Most inferior aspect of the pleural space is not imaged as it is distended extending well inferior to what would be expected and inferior to the pleural space in the LEFT chest. Upper Abdomen: Marked hepatic cirrhosis with nodular shrunken hepatic contour. Marked worsening of liver disease compared to the exam of Aug 03, 2016. Cholelithiasis. Gallbladder does not appear distended but is not well assessed due to respiratory motion. Massive perigastric, gastric and moderate-size esophageal varices. Perigastric collateral venous network measuring up to 11 x 6 cm. Gastric varix in the gastric cardia/fundus 2.3 x 2.0 cm can be seen  communicating with this large variceal network in the upper abdomen extending from coronary vein. Large splenorenal shunt. Splenomegaly. Small volume ascites in the abdomen. No acute findings relative to imaged portions of the kidneys and adrenal glands. Question RIGHT colonic thickening. Musculoskeletal: Spinal degenerative changes. No acute musculoskeletal process or destructive bone finding. IMPRESSION: 1. Large RIGHT-sided pleural effusion with near complete collapse of the lung in the RIGHT chest with partial sparing of the RIGHT upper lobe. Findings are likely related to hepatic hydrothorax in this patient with signs of worsening liver disease/cirrhosis and evidence of ascites in the upper abdomen. 2. Marked hepatic cirrhosis with signs of severe portal hypertension and evidence of large perigastric and gastric varices. GI evaluation and interventional radiology consultation may be helpful for further management as warranted. 3. Cholelithiasis, gallbladder not well assessed due to respiratory motion in the upper abdomen. 4. Signs of potential colonic thickening, correlate with any abdominal symptoms that would indicate portal colopathy. Ultimately abdominal imaging may be helpful for further evaluation in this complex patient. 5. Aortic atherosclerosis 6. Patchy areas of ground-glass attenuation in the LEFT upper lobe most suggestive of infectious or inflammatory changes, suggest 3 month follow-up to ensure resolution Aortic Atherosclerosis (ICD10-I70.0). Electronically Signed   By: Zetta Bills M.D.   On: 04/27/2021 15:30   US Abdomen Limited  Result Date: 04/29/2021 CLINICAL DATA:  Ascites EXAM: LIMITED ABDOMEN ULTRASOUND FOR ASCITES TECHNIQUE: Limited ultrasound survey for ascites was performed in all four abdominal quadrants. COMPARISON:  None. FINDINGS: No significant ascites was identified. IMPRESSION: No significant ascites.  Paracentesis canceled. Electronically Signed   By: Miachel Roux M.D.    On: 04/29/2021 10:42   DG CHEST PORT 1 VIEW  Result Date: 04/29/2021 CLINICAL DATA:  Intubation EXAM: PORTABLE CHEST 1 VIEW COMPARISON:  04/29/2021 FINDINGS: Interval placement of endotracheal tube with distal tip terminating approximately 4.2 cm above the carina. Enteric tube courses below the diaphragm with distal tip beyond the inferior margin of the film. Stable cardiomegaly. Aortic atherosclerosis. Persistent right-sided pleural effusion. Extensive heterogeneous airspace opacities throughout both lungs, slightly improved within the right lung field. No pneumothorax. IMPRESSION: 1. Appropriate positioning of ET and enteric tubes. 2. Extensive heterogeneous airspace opacities throughout both lungs, slightly improved within the right lung field. Electronically Signed   By: Davina Poke D.O.   On: 04/29/2021 16:31   DG Chest Port 1V same Day  Result Date: 04/29/2021 CLINICAL DATA:  Shortness of breath. EXAM: PORTABLE CHEST 1 VIEW COMPARISON:  04/28/2021 and older studies.  CT, 04/27/2021 FINDINGS: Since the previous day's study, airspace opacities have increased on the right, now noted throughout the right perihilar region into the central right upper lobe. Left-sided airspace opacities are similar  to the prior exam. Right pleural effusion is likely unchanged, less defined due to the semi-erect positioning. No convincing left pleural effusion.  No pneumothorax. Stable cardiac silhouette. IMPRESSION: 1. Interval worsening of lung aeration with increased airspace opacities in the right mid and upper lung, and stable airspace opacities in the left lung. Findings suspect the multifocal pneumonia. 2. Persistent right pleural effusion. Electronically Signed   By: Lajean Manes M.D.   On: 04/29/2021 14:14   CT Angio Abd/Pel w/ and/or w/o  Result Date: 04/30/2021 CLINICAL DATA:  Portal hypertension. Evaluate anatomy for possible tips. EXAM: CTA ABDOMEN AND PELVIS WITHOUT AND WITH CONTRAST TECHNIQUE:  Multidetector CT imaging of the abdomen and pelvis was performed using the standard protocol during bolus administration of intravenous contrast. Multiplanar reconstructed images and MIPs were obtained and reviewed to evaluate the vascular anatomy. RADIATION DOSE REDUCTION: This exam was performed according to the departmental dose-optimization program which includes automated exposure control, adjustment of the mA and/or kV according to patient size and/or use of iterative reconstruction technique. CONTRAST:  122m OMNIPAQUE IOHEXOL 350 MG/ML SOLN COMPARISON:  CT scan of the chest 04/27/2021; CT abdomen/pelvis 08/03/2016 FINDINGS: VASCULAR Aorta: Mild calcifications throughout the abdominal aorta. No evidence of aneurysm or dissection. Celiac: Patent at the origin. Conventional hepatic arterial branching pattern. 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 The smaller measures 0.7 cm (image 48 series 4) the larger measures 0.9 cm (image 57 of series 4). There is some abnormal arterial phase enhancement tracking along the main portal vein in the porta hepatis and extending along the right anterior division of the intrahepatic portal vein. Findings are best demonstrated on the coronal reformatted images. Given findings of portal venous thrombosis, this could represent altered arterial flow dynamics. SMA: Patent without evidence of aneurysm, dissection, vasculitis or significant stenosis. Renals: Both renal arteries are patent without evidence of aneurysm, dissection, vasculitis, fibromuscular dysplasia or significant stenosis. IMA: Patent without evidence of aneurysm, dissection, vasculitis or significant stenosis. Inflow: Patent without evidence of aneurysm, dissection, vasculitis or significant stenosis. Proximal Outflow: Bilateral common femoral and visualized portions of the superficial and profunda femoral arteries are patent without evidence of  aneurysm, dissection, vasculitis or significant stenosis. Veins: The main portal vein is small in caliber. There is evidence of some chronic thrombus within the main portal vein as it enters the liver. The posterior right division of the portal vein appears chronically thrombosed with mild cavernous transformation. The left intrahepatic portal vein is not well seen. There is a recanalized paraumbilical vein. Massive gastro renal shunt providing supply to a large network of gastric varices. The primary afferent artery from the portal system is the left gastric artery. Additionally, there are short gastric contributors arising from the splenic hilum. The left renal vein is significantly dilated due to the increased flow. Review of the MIP images confirms the above findings. NON-VASCULAR Lower chest: Large right and small left layering pleural effusions. Additionally, there is significant multifocal airspace opacity in a peribronchovascular distribution throughout the remaining lungs bilaterally with small areas of air bronchogram in the lungs. The intracardiac blood pool is hypodense relative to the adjacent myocardium consistent with anemia. No pericardial effusion. Hepatobiliary: Small, nodular cirrhotic liver. No definite discrete enhancing hepatic mass. Stones are present within the gallbladder lumen. Mild gallbladder wall thickening which is not unexpected in the setting of cirrhosis. No biliary ductal dilatation. Pancreas: Unremarkable. No pancreatic ductal dilatation or surrounding inflammatory changes. Spleen:  Splenomegaly. Adrenals/Urinary Tract: Unremarkable adrenal glands. No enhancing renal mass, hydronephrosis or nephrolithiasis. Unremarkable ureters and bladder. Stomach/Bowel: No focal bowel wall thickening or evidence of obstruction. Gastric tube in place. Gastric varices as above. Lymphatic: No suspicious lymphadenopathy. Reproductive: Uterus and bilateral adnexa are unremarkable. Other: Small volume  ascites in the pelvic cul-de-sac. Musculoskeletal: No acute or significant osseous findings. IMPRESSION: 1. Hepatic cirrhosis complicated by portal hypertension with gastric varices. 2. Massive gastro renal shunt resulting in asymmetric dilation of the left renal vein due to the diverted portosystemic blood flow. Primary afferent vein appears to be the left gastric vein. Additionally, there are short gastric contributors. 3. 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 4. Atypical arterial enhancement pattern along the main portal vein extending along the anterior division of the right portal vein favored to reflect altered arterial flow pattern in the setting of portal venous occlusion. No convincing evidence of intrahepatic mass to suggest that the enhancement along the portal vein represents tumor thrombus. 5. Significant bilateral diffuse lung opacities in a peribronchovascular distribution concerning for multifocal pneumonia including atypical/viral/COVID pneumonia. 6. Right larger than left pleural effusions may be due to the underlying pulmonary process, or reflect hepatic hydrothorax related to the underlying cirrhosis and portal hypertension. 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 7. Small splenic artery aneurysms at the splenic hilum measuring less than 1 cm. 8.  Aortic Atherosclerosis (ICD10-I70.0). 9. Small volume ascites. 10. Cholelithiasis. Signed, Criselda Peaches, MD, [facility] Electronically Signed   By: Jacqulynn Cadet M.D.   On: 04/30/2021 07:33   IR THORACENTESIS ASP PLEURAL SPACE W/IMG GUIDE  Result Date: 04/28/2021 INDICATION: Patient with history of NASH cirrhosis, presented to the ED with shortness of breath. Found to have a right-sided pleural effusion. Request is for therapeutic and diagnostic thoracentesis.  EXAM: ULTRASOUND GUIDED DIAGNOSTIC AND THERAPEUTIC THORACENTESIS MEDICATIONS: Lidocaine 1% 10 mL COMPLICATIONS: None immediate. PROCEDURE: An ultrasound guided thoracentesis was thoroughly discussed with the patient and questions answered. The benefits, risks, alternatives and complications were also discussed. The patient understands and wishes to proceed with the procedure. Written consent was obtained. Ultrasound was performed to localize and mark an adequate pocket of fluid in the right chest. The area was then prepped and draped in the normal sterile fashion. 1% Lidocaine was used for local anesthesia. Under ultrasound guidance a 19 gauge, 7-cm, Yueh catheter was introduced. Thoracentesis was performed. The catheter was removed and a dressing applied. FINDINGS: A total of approximately 1.55 L of serosanguineous fluid was removed. Samples were sent to the laboratory as requested by the clinical team. IMPRESSION: Successful ultrasound guided right-sided diagnostic and therapeutic thoracentesis yielding 1.55 L of pleural fluid. Read by: Rushie Nyhan, NP Electronically Signed   By: Corrie Mckusick D.O.   On: 04/28/2021 15:41    Labs:  CBC: Recent Labs    11/18/20 1758 04/27/21 1229 04/27/21 1255 04/29/21 0222 04/29/21 1655 04/30/21 0355 04/30/21 0718  WBC 7.5 12.0*  --  16.9*  --   --  17.9*  HGB 12.3 11.6*   < > 9.8* 9.5* 8.8* 10.0*  HCT 35.0* 34.5*   < > 30.9* 28.0* 26.0* 31.3*  PLT 82* PLATELET CLUMPS NOTED ON SMEAR, UNABLE TO ESTIMATE  --  114*  --   --  PLATELET CLUMPS NOTED ON SMEAR, UNABLE TO ESTIMATE   < > = values in this interval  not displayed.    COAGS: Recent Labs    04/27/21 1637 04/28/21 0329 04/29/21 1527 04/30/21 0718  INR 1.6* 1.7* 2.1* 2.0*    BMP: Recent Labs    04/27/21 1229 04/27/21 1255 04/28/21 0329 04/29/21 0222 04/29/21 1655 04/30/21 0355 04/30/21 0718  NA 139 137 138 139 142 142 140  K 4.0 3.9 3.9 4.1 4.0 3.9 4.2  CL 102 104 103 104  --   --   106  CO2 29  --  27 26  --   --  26  GLUCOSE 138* 135* 121* 116*  --   --  138*  BUN 20 21 20  26*  --   --  38*  CALCIUM 8.6*  --  8.4* 8.8*  --   --  8.8*  CREATININE 0.74 0.70 0.75 0.81  --   --  1.09*  GFRNONAA >60  --  >60 >60  --   --  54*    LIVER FUNCTION TESTS: Recent Labs    11/18/20 1758 04/27/21 1229 04/30/21 0718  BILITOT 2.3* 2.4* 2.3*  AST 62* 63* 39  ALT 42 42 23  ALKPHOS 113 99 62  PROT 6.6 6.4* 5.9*  ALBUMIN 2.8* 2.2* 2.9*    TUMOR MARKERS: No results for input(s): AFPTM, CEA, CA199, CHROMGRNA in the last 8760 hours.  Assessment and Plan:  71 y/o F with history of NASH cirrhosis admitted 2/9 with fluid overload and likely right hepatic hydrothorax s/p 1.5 L thoracentesis 2/10 (labs indicate transudative fluid), small volume ascites on CTA however no percutaneous window for paracentesis seen on ultrasound examination. Patient experienced a sudden respiratory decompensation the afternoon of 2/11 and is intubated, sedated, ventilated and in the ICU. IR has been consulted for possible TIPS placement.   Exam today significant for hypotension requiring pressor support, no jaundice or peripheral edema noted. MELD-Na 18 by today's lab work (notable for worsening INR, creatinine and persistently elevated bilirubin).  Patient history and imaging have been reviewed by several IR physicians with the conclusion that given patient's thrombosed/dimunitive Portal Vein, she is a poor candidate for TIPS. If a TIPS can be placed, it has a high risk of thrombosis. In the abscence of a patent Portal Vein, the splenorenal shunt (gastric varcies) is her main outflow for the mesenteric and splenic veins.  Occlusion of the splenorenal shunt (gastric varcies) with BRTO could significantly compromise her spleen and bowel as it would remove her remaining outflow.    At this time, TIPS and/or BRTO is not recommended for the treatment of her ascites/hepatic hydrothorax.  If the patient is being  admitted to Palliative care or Hospice, a tunneled chest PleurX catheter can be placed.  If the patient acutely bleeds from the splenorenal shunt/gastric varix and it cannot be controlled endoscopically, TIPS with or without BRTO can be attempted.  As there is no indication for elective TIPS at this time IR will not follow along, however we remain available as needed - please call with any questions or concerns.   Thank you for this interesting consult.  I greatly enjoyed meeting Courtney Braun and look forward to participating in their care.  A copy of this report was sent to the requesting provider on this date.  Electronically Signed: Joaquim Nam, PA-C 04/30/2021, 10:57 AM   I spent a total of 55 Miinutes in face to face in clinical consultation, greater than 50% of which was counseling/coordinating care for possible TIPS placement.

## 2021-05-01 DIAGNOSIS — K7460 Unspecified cirrhosis of liver: Secondary | ICD-10-CM | POA: Diagnosis not present

## 2021-05-01 DIAGNOSIS — J9601 Acute respiratory failure with hypoxia: Secondary | ICD-10-CM | POA: Diagnosis not present

## 2021-05-01 DIAGNOSIS — Z9911 Dependence on respirator [ventilator] status: Secondary | ICD-10-CM

## 2021-05-01 DIAGNOSIS — R188 Other ascites: Secondary | ICD-10-CM | POA: Diagnosis not present

## 2021-05-01 DIAGNOSIS — E440 Moderate protein-calorie malnutrition: Secondary | ICD-10-CM | POA: Insufficient documentation

## 2021-05-01 LAB — CULTURE, RESPIRATORY W GRAM STAIN: Culture: NORMAL

## 2021-05-01 LAB — PHOSPHORUS: Phosphorus: 4.2 mg/dL (ref 2.5–4.6)

## 2021-05-01 LAB — MAGNESIUM: Magnesium: 2.1 mg/dL (ref 1.7–2.4)

## 2021-05-01 LAB — BASIC METABOLIC PANEL
Anion gap: 7 (ref 5–15)
BUN: 48 mg/dL — ABNORMAL HIGH (ref 8–23)
CO2: 28 mmol/L (ref 22–32)
Calcium: 8.8 mg/dL — ABNORMAL LOW (ref 8.9–10.3)
Chloride: 105 mmol/L (ref 98–111)
Creatinine, Ser: 1 mg/dL (ref 0.44–1.00)
GFR, Estimated: 59 mL/min — ABNORMAL LOW (ref >60)
Glucose, Bld: 167 mg/dL — ABNORMAL HIGH (ref 70–99)
Potassium: 4.3 mmol/L (ref 3.5–5.1)
Sodium: 140 mmol/L (ref 135–145)

## 2021-05-01 LAB — GLUCOSE, CAPILLARY
Glucose-Capillary: 162 mg/dL — ABNORMAL HIGH (ref 70–99)
Glucose-Capillary: 146 mg/dL — ABNORMAL HIGH (ref 70–99)
Glucose-Capillary: 127 mg/dL — ABNORMAL HIGH (ref 70–99)
Glucose-Capillary: 140 mg/dL — ABNORMAL HIGH (ref 70–99)
Glucose-Capillary: 154 mg/dL — ABNORMAL HIGH (ref 70–99)
Glucose-Capillary: 141 mg/dL — ABNORMAL HIGH (ref 70–99)

## 2021-05-01 LAB — CYTOLOGY - NON PAP

## 2021-05-01 LAB — PROCALCITONIN: Procalcitonin: 0.46 ng/mL

## 2021-05-01 MED ORDER — FENTANYL CITRATE (PF) 100 MCG/2ML IJ SOLN: 25.0000 ug | INTRAMUSCULAR | Status: DC | PRN

## 2021-05-01 MED ORDER — FENTANYL CITRATE (PF) 100 MCG/2ML IJ SOLN
25.0000 ug | INTRAMUSCULAR | Status: DC | PRN
  Administered 2021-05-01 – 2021-05-02 (×2): 100 ug via INTRAVENOUS
  Filled 2021-05-01 (×2): qty 2

## 2021-05-01 MED ORDER — DEXMEDETOMIDINE HCL IN NACL 400 MCG/100ML IV SOLN
0.0000 ug/kg/h | INTRAVENOUS | Status: DC
  Administered 2021-05-01: 0.8 ug/kg/h via INTRAVENOUS
  Administered 2021-05-01: 0.2 ug/kg/h via INTRAVENOUS
  Filled 2021-05-01 (×2): qty 100

## 2021-05-01 MED ORDER — VITAL 1.5 CAL PO LIQD
1000.0000 mL | ORAL | Status: DC
  Administered 2021-05-01 – 2021-05-03 (×2): 1000 mL

## 2021-05-01 MED ORDER — INSULIN ASPART 100 UNIT/ML IJ SOLN
1.0000 [IU] | INTRAMUSCULAR | Status: DC
  Administered 2021-05-01: 1 [IU] via SUBCUTANEOUS
  Administered 2021-05-01: 2 [IU] via SUBCUTANEOUS
  Administered 2021-05-01 – 2021-05-02 (×3): 1 [IU] via SUBCUTANEOUS
  Administered 2021-05-02 (×2): 2 [IU] via SUBCUTANEOUS
  Administered 2021-05-02 (×3): 1 [IU] via SUBCUTANEOUS
  Administered 2021-05-03: 2 [IU] via SUBCUTANEOUS
  Administered 2021-05-03 (×2): 1 [IU] via SUBCUTANEOUS
  Administered 2021-05-04 (×2): 2 [IU] via SUBCUTANEOUS
  Administered 2021-05-04 (×2): 1 [IU] via SUBCUTANEOUS
  Administered 2021-05-04: 2 [IU] via SUBCUTANEOUS
  Administered 2021-05-04: 3 [IU] via SUBCUTANEOUS

## 2021-05-01 MED ORDER — DOCUSATE SODIUM 50 MG/5ML PO LIQD: 100.0000 mg | Freq: Two times a day (BID) | ORAL | Status: DC

## 2021-05-01 MED ORDER — VANCOMYCIN HCL 1000 MG/200ML IV SOLN
1000.0000 mg | INTRAVENOUS | Status: DC
  Administered 2021-05-01: 1000 mg via INTRAVENOUS
  Filled 2021-05-01 (×3): qty 200

## 2021-05-01 MED ORDER — PROSOURCE TF PO LIQD
45.0000 mL | Freq: Four times a day (QID) | ORAL | Status: DC
  Administered 2021-05-01 – 2021-05-03 (×10): 45 mL
  Filled 2021-05-01 (×10): qty 45

## 2021-05-01 MED ORDER — POLYETHYLENE GLYCOL 3350 17 G PO PACK: 17.0000 g | PACK | Freq: Every day | ORAL | Status: DC

## 2021-05-01 NOTE — Progress Notes (Signed)
[facility] Progress Note  Courtney Braun 71 y.o. Jul 17, 1950  CC: Decompensated cirrhosis   Subjective: Patient seen and examined at bedside.  She remains intubated.  Not able to obtain any history from patient.  Discussed with ICU nurse  ROS : Not able to obtain   Objective: Vital signs in last 24 hours: Vitals:   05/01/21 0741 05/01/21 0800  BP: (!) 125/52 (!) 110/46  Pulse: 76 69  Resp: (!) 30 15  Temp:    SpO2: 90% 95%    Physical Exam:  General.  Sedated and intubated Abdomen : Moderately distended, bowel sounds present, not able to appreciate tenderness. Lower extremity : no edema   Lab Results: Recent Labs    04/30/21 0718 04/30/21 1811 05/01/21 0549  NA 140  --  140  K 4.2  --  4.3  CL 106  --  105  CO2 26  --  28  GLUCOSE 138*  --  167*  BUN 38*  --  48*  CREATININE 1.09*  --  1.00  CALCIUM 8.8*  --  8.8*  MG 2.0 2.1 2.1  PHOS 3.9 4.8* 4.2   Recent Labs    04/30/21 0718  AST 39  ALT 23  ALKPHOS 62  BILITOT 2.3*  PROT 5.9*  ALBUMIN 2.9*   Recent Labs    04/29/21 0222 04/29/21 1655 04/30/21 0355 04/30/21 0718  WBC 16.9*  --   --  17.9*  NEUTROABS 14.0*  --   --   --   HGB 9.8*   < > 8.8* 10.0*  HCT 30.9*   < > 26.0* 31.3*  MCV 96.6  --   --  97.8  PLT 114*  --   --  PLATELET CLUMPS NOTED ON SMEAR, UNABLE TO ESTIMATE   < > = values in this interval not displayed.   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 MELD 18 as of 02/12 -Gastric varices with splenorenal shunt.  Portal vein thrombosis.  Not a candidate for TIPS or BRTO per IR. -Respiratory failure.  Intubated.  Recommendation ----------------------- -Discussed with ICU nurse.  She may benefit from palliative care consult because of overall poor prognosis. Continue other supportive care -GI will follow.  Consider periodic  lab depending on palliative care consult   Otis Brace MD, [place] 05/01/2021, 9:17 AM  Contact #  [phone]

## 2021-05-01 NOTE — Progress Notes (Signed)
Heart Failure Navigator Progress Note  Assessed for Heart & Vascular TOC clinic readiness.  Patient does not meet criteria due to currently admitted to ICU, admission significant for cirrhosis-history of hepatic encephalopathy, septic shock, respiratory failure with intubation. No plans for [facility] clinic upon discharge.     Pricilla Holm, MSN, RN Heart Failure Nurse Navigator [phone]

## 2021-05-01 NOTE — Progress Notes (Signed)
NAME:  Courtney Braun, MRN:  [id number], DOB:  1950-03-31, LOS: 4 ADMISSION DATE:  04/27/2021, CONSULTATION DATE: 04/29/2021 REFERRING MD: Dr. Pietro Braun, CHIEF COMPLAINT: Acute respiratory failure  History of Present Illness:  71 year old woman with a history of Courtney Braun cirrhosis complicated by esophageal varices, chronic anemia and right hepatic hydrothorax.  Also with a history of diabetes, hypertension, hyperlipidemia. Admitted 2/9 with progressive abdominal distention and associated dyspnea.  She has had an overall decline in her health over about 1 year.  Diuretic dosing has been adjusted as an outpatient but symptoms continue to progress.  Admitted for further care.  She underwent a right thoracentesis on 2/10.  Per RN she was doing well on 4 L/min morning of 2/11, then developed a sudden decline in respiratory status, more dyspnea, more hypoxemia and increased cough with yellow secretions.  Now on 15 L/min.  Chest x-ray reviewed, shows increasing bilateral alveolar infiltrates, particularly on the right.  The right pleural effusion is smaller than at presentation, has not increased significantly since thoracentesis.  Lasix given, empiric ceftriaxone started.  PCCM consulted to eval. She will move to ICU for further care  Pertinent  Medical History   Past Medical History:  Diagnosis Date   Anemia    Diabetes mellitus without complication ([place])    type II   History of blood transfusion    Hyperlipidemia    Hypertension     Significant Hospital Events: Including procedures, antibiotic start and stop dates in addition to other pertinent events   CT chest 2/9 >> large right pleural effusion with compressive atelectasis of the right lower and right middle lobes.  Marked hepatic cirrhosis with severe portal hypertension and evidence for large perigastric and gastric varices.  Scattered patchy left upper lobe groundglass infiltrates Chest x-ray 2/11 >> progression of bilateral alveolar infiltrates,  no real increase in size of pleural effusions since thoracentesis Pleural fluid 2/10 >> transudate.  WBC 97 (22 N, 57L, 21 M, 0E).  Gram stain negative TTE 2/11 >> CT Angio A/P for TIPS eval on 2/12 shows chronic portal venous thrombosis, hepatic hydrothorax, gastrorenal shunting, bilateral patchy airspace opacities consistent with pneumonia.  Interim History / Subjective:  Stopped levo last night Reduced fentanyl This morning still on pressors Sedated, not responsive Had 3 in and out caths last night   Objective   Blood pressure (!) 125/52, pulse 76, temperature 98.3 F (36.8 C), temperature source Axillary, resp. rate (!) 30, height 5' 5.5" (1.664 m), weight 111 kg, SpO2 90 %.    Vent Mode: PRVC FiO2 (%):  [40 %] 40 % Set Rate:  [26 bmp] 26 bmp Vt Set:  [350 mL] 350 mL PEEP:  [5 cmH20] 5 cmH20 Plateau Pressure:  [15 cmH20-16 cmH20] 16 cmH20   Intake/Output Summary (Last 24 hours) at 05/01/2021 0748 Last data filed at 05/01/2021 0400 Gross per 24 hour  Intake 2032.85 ml  Output 875 ml  Net 1157.85 ml    Filed Weights   04/28/21 1607 04/30/21 0500 05/01/21 0500  Weight: 112.3 kg 110.3 kg 111 kg    Examination: Gen:      Intubated, sedated, acutely ill appearing HEENT:  ETT to vent Lungs:    sounds of mechanical ventilation auscultated breath sounds diminished,  bilateral rhonchi CV:         RRR, systolic murmur Abd:      Soft, nontender Ext:    No edema, extremities thin Skin:      Warm and dry; no rashes  Neuro:   sedated, RASS -2  Resolved Hospital Problem list     Assessment & Plan:  Acute hypoxemic respiratory failure Hepatic Hydrothorax Diffuse bilateral pulmonary infiltrates on CT concerning for HCAP.  - continue vanc/cefepime  - ddimer elevatd in the setting of portal venous thrombosis - continue diuresis with lasix, aldactone - continue prn bronchodilators - plan for spontaneous breathing trial  Septic Shock - on low dose norepinephrine - abx -  titrate down as tolerated for goal MAP>65 - judicious fluid and albumine use  Decompensated Nash cirrhosis.   With portal gastropathy, esophageal and gastric varices and hepatic hydrothorax. IR consulted and determined patient is not a candidate for TIPS given thrombosed portal vein - GI following, IR consulted for TIPS evaluation. Continue to appreciate recommendations - continue diuresis with lasix and aldactone as ordered - Update family and discuss palliative care and hospice - consider foley cath  Coagulopathy of liver disease -follow INR -consider FFP if she needs procedures  History of hepatic encephalopathy - On lactulose at home, continue - discontinued rifaximin - hold bowel regimen given copious stool output  Chronic thrombocytopenia -following CBC -no indication transfusion at this time  Diabetes with hyperglycemia -start CBG's and SSI coverage   Best Practice (right click and "Reselect all SmartList Selections" daily)   Diet/type: NPO w/ meds via tube DVT prophylaxis: SCD GI prophylaxis: PPI Lines: N/A Foley:  N/A Code Status:  full code Last date of multidisciplinary goals of care discussion [discussed with patient 2/11. Niece updated at bedside 04/30/21]  The patient is critically ill due to septic shock, respiratory failure.  Critical care was necessary to treat or prevent imminent or life-threatening deterioration.  Critical care was time spent personally by me on the following activities: development of treatment plan with patient and/or surrogate as well as nursing, discussions with consultants, evaluation of patient's response to treatment, examination of patient, obtaining history from patient or surrogate, ordering and performing treatments and interventions, ordering and review of laboratory studies, ordering and review of radiographic studies, pulse oximetry, re-evaluation of patient's condition and participation in multidisciplinary rounds.   Critical  Care Time devoted to patient care services described in this note is 40 minutes. This time reflects time of care of this [place] . This critical care time does not reflect separately billable procedures or procedure time, teaching time or supervisory time of PA/NP/Med student/Med Resident etc but could involve care discussion time.       Derron Pipkins A  Damir Leung    Labs   CBC: Recent Labs  Lab 04/27/21 1229 04/27/21 1255 04/29/21 0222 04/29/21 1655 04/30/21 0355 04/30/21 0718  WBC 12.0*  --  16.9*  --   --  17.9*  NEUTROABS 9.9*  --  14.0*  --   --   --   HGB 11.6* 11.6* 9.8* 9.5* 8.8* 10.0*  HCT 34.5* 34.0* 30.9* 28.0* 26.0* 31.3*  MCV 93.5  --  96.6  --   --  97.8  PLT PLATELET CLUMPS NOTED ON SMEAR, UNABLE TO ESTIMATE  --  114*  --   --  PLATELET CLUMPS NOTED ON SMEAR, UNABLE TO ESTIMATE     Basic Metabolic Panel: Recent Labs  Lab 04/27/21 1229 04/27/21 1255 04/28/21 0329 04/29/21 0222 04/29/21 1628 04/29/21 1655 04/30/21 0355 04/30/21 0718 04/30/21 1811 05/01/21 0549  NA 139 137 138 139  --  142 142 140  --  140  K 4.0 3.9 3.9 4.1  --  4.0 3.9  4.2  --  4.3  CL 102 104 103 104  --   --   --  106  --  105  CO2 29  --  27 26  --   --   --  26  --  28  GLUCOSE 138* 135* 121* 116*  --   --   --  138*  --  167*  BUN 20 21 20  26*  --   --   --  38*  --  48*  CREATININE 0.74 0.70 0.75 0.81  --   --   --  1.09*  --  1.00  CALCIUM 8.6*  --  8.4* 8.8*  --   --   --  8.8*  --  8.8*  MG  --   --   --   --  1.8  --   --  2.0 2.1 2.1  PHOS  --   --   --   --  4.1  --   --  3.9 4.8* 4.2    GFR: Estimated Creatinine Clearance: 62.7 mL/min (by C-G formula based on SCr of 1 mg/dL). Recent Labs  Lab 04/27/21 1229 04/29/21 0222 04/29/21 1527 04/30/21 0718 05/01/21 0549  PROCALCITON  --  0.24  --  0.47 0.46  WBC 12.0* 16.9*  --  17.9*  --   LATICACIDVEN  --   --  1.6 1.2  --      Liver Function Tests: Recent Labs  Lab 04/27/21 1229 04/30/21 0718  AST 63*  39  ALT 42 23  ALKPHOS 99 62  BILITOT 2.4* 2.3*  PROT 6.4* 5.9*  ALBUMIN 2.2* 2.9*    No results for input(s): LIPASE, AMYLASE in the last 168 hours. Recent Labs  Lab 04/27/21 1815  AMMONIA 30     ABG    Component Value Date/Time   PHART 7.379 04/30/2021 0355   PCO2ART 53.2 (H) 04/30/2021 0355   PO2ART 98 04/30/2021 0355   HCO3 31.5 (H) 04/30/2021 0355   TCO2 33 (H) 04/30/2021 0355   O2SAT 97.0 04/30/2021 0355      Coagulation Profile: Recent Labs  Lab 04/27/21 1637 04/28/21 0329 04/29/21 1527 04/30/21 0718  INR 1.6* 1.7* 2.1* 2.0*     Cardiac Enzymes: No results for input(s): CKTOTAL, CKMB, CKMBINDEX, TROPONINI in the last 168 hours.  HbA1C: Hgb A1c MFr Bld  Date/Time Value Ref Range Status  04/28/2021 03:29 AM 4.3 (L) 4.8 - 5.6 % Final    Comment:    (NOTE) Pre diabetes:          5.7%-6.4%  Diabetes:              >6.4%  Glycemic control for   <7.0% adults with diabetes     CBG: Recent Labs  Lab 04/30/21 1609 04/30/21 1923 04/30/21 2318 05/01/21 0326 05/01/21 0730  GLUCAP 120* 135* 153* 162* 146*

## 2021-05-01 NOTE — Progress Notes (Signed)
[place] Progress Note Patient Name: Courtney Braun DOB: 1950-04-29 MRN: [id number]   Date of Service  05/01/2021  HPI/Events of Note  Urinary retention - Already has been I/O cathed X 3 today. Bladder scan now reveals a residual of 640 mL.   eICU Interventions  Will place Foley catheter.      Intervention Category Major Interventions: Other:  Lysle Dingwall 05/01/2021, 11:52 PM

## 2021-05-01 NOTE — Progress Notes (Addendum)
Initial Nutrition Assessment  DOCUMENTATION CODES:   Non-severe (moderate) malnutrition in context of chronic illness   Resume tube feeding via OG tube as clinical status allows: Change to Vital 1.5 at 25 ml/h, increase by 10 ml every 4 hours to goal rate of 45 ml/h (1080 ml per day) Prosource TF 45 ml QID  Provides 1780 kcal, 117 gm protein, 825 ml free water daily.  NUTRITION DIAGNOSIS:   Moderate Malnutrition related to chronic illness (NASH cirrhosis) as evidenced by moderate fat depletion, mild muscle depletion, moderate muscle depletion.  GOAL:   Patient will meet greater than or equal to 90% of their needs  MONITOR:   Vent status, TF tolerance, Skin, Labs  REASON FOR ASSESSMENT:   Ventilator, Consult Enteral/tube feeding initiation and management  ASSESSMENT:   71 yo female admitted with acute respiratory failure, LLL PNA. PMH includes NASH cirrhosis, esophageal varices, chronic anemia, R hepatic hydrothorax, DM, HTN, HLD.  2/10 - S/P R thoracentesis 2/11 - Intubated  Discussed patient in ICU rounds and with RN today. Plans to turn fentanyl off today. Received MD Consult for TF initiation and management. TF was initiated on 2/11 per adult TF protocol: Vital High Protein at 40 ml/h via OG tube with Prosource TF 45 ml BID to provide 1040 kcal, 106 gm protein, 803 ml free water daily.  Patient had a small amount of emesis this morning, so TF is on hold.  Patient is currently intubated on ventilator support MV: 9 L/min Temp (24hrs), Avg:98.8 F (37.1 C), Min:98.3 F (36.8 C), Max:99.7 F (37.6 C)   Labs reviewed.  CBG: [phone]  Medications reviewed and include ferrous sulfate, Lasix, Novolog, lactulose, MVI with minerals, Protonix, Aldactone, Precedex, Levophed.  Usual weights reviewed. No significant weight changes in the past 5 months. Suspect edema is masking weight loss and further muscle & fat depletions.   NUTRITION - FOCUSED PHYSICAL  EXAM:  Flowsheet Row Most Recent Value  Orbital Region Moderate depletion  Upper Arm Region No depletion  Thoracic and Lumbar Region Moderate depletion  Buccal Region Unable to assess  Temple Region Moderate depletion  Clavicle Bone Region Moderate depletion  Clavicle and Acromion Bone Region No depletion  Scapular Bone Region No depletion  Dorsal Hand No depletion  Patellar Region No depletion  Anterior Thigh Region No depletion  Posterior Calf Region Mild depletion  Edema (RD Assessment) Mild  Hair Reviewed  Eyes Unable to assess  Mouth Unable to assess  Skin Reviewed  Nails Reviewed       Diet Order:   Diet Order             Diet NPO time specified  Diet effective now                   EDUCATION NEEDS:   Not appropriate for education at this time  Skin:  Skin Assessment: Reviewed RN Assessment (MASD to coccyx)  Last BM:  2/13 type 7  Height:   Ht Readings from Last 1 Encounters:  04/29/21 5' 5.5" (1.664 m)    Weight:   Wt Readings from Last 1 Encounters:  05/01/21 111 kg    BMI:  Body mass index is 40.1 kg/m.  Estimated Nutritional Needs:   Kcal:  3893  Protein:  115-130 gm  Fluid:  >/= 1.8 L    Lucas Mallow RD, LDN, CNSC Please refer to Amion for contact information.

## 2021-05-02 DIAGNOSIS — J9601 Acute respiratory failure with hypoxia: Secondary | ICD-10-CM | POA: Diagnosis not present

## 2021-05-02 DIAGNOSIS — R188 Other ascites: Secondary | ICD-10-CM | POA: Diagnosis not present

## 2021-05-02 DIAGNOSIS — K7460 Unspecified cirrhosis of liver: Secondary | ICD-10-CM | POA: Diagnosis not present

## 2021-05-02 DIAGNOSIS — Z9911 Dependence on respirator [ventilator] status: Secondary | ICD-10-CM | POA: Diagnosis not present

## 2021-05-02 LAB — BASIC METABOLIC PANEL
Anion gap: 11 (ref 5–15)
BUN: 49 mg/dL — ABNORMAL HIGH (ref 8–23)
CO2: 25 mmol/L (ref 22–32)
Calcium: 9.3 mg/dL (ref 8.9–10.3)
Chloride: 107 mmol/L (ref 98–111)
Creatinine, Ser: 0.97 mg/dL (ref 0.44–1.00)
GFR, Estimated: >60 mL/min (ref >60)
Glucose, Bld: 143 mg/dL — ABNORMAL HIGH (ref 70–99)
Potassium: 4.2 mmol/L (ref 3.5–5.1)
Sodium: 143 mmol/L (ref 135–145)

## 2021-05-02 LAB — POCT I-STAT 7, (LYTES, BLD GAS, ICA,H+H)
Acid-Base Excess: 8 mmol/L — ABNORMAL HIGH (ref 0.0–2.0)
Bicarbonate: 33 mmol/L — ABNORMAL HIGH (ref 20.0–28.0)
Calcium, Ion: 1.3 mmol/L (ref 1.15–1.40)
HCT: 24 % — ABNORMAL LOW (ref 36.0–46.0)
Hemoglobin: 8.2 g/dL — ABNORMAL LOW (ref 12.0–15.0)
O2 Saturation: 99 %
Patient temperature: 97
Potassium: 4 mmol/L (ref 3.5–5.1)
Sodium: 143 mmol/L (ref 135–145)
TCO2: 34 mmol/L — ABNORMAL HIGH (ref 22–32)
pCO2 arterial: 48.4 mmHg — ABNORMAL HIGH (ref 32–48)
pH, Arterial: 7.437 (ref 7.35–7.45)
pO2, Arterial: 129 mmHg — ABNORMAL HIGH (ref 83–108)

## 2021-05-02 LAB — GLUCOSE, CAPILLARY
Glucose-Capillary: 138 mg/dL — ABNORMAL HIGH (ref 70–99)
Glucose-Capillary: 142 mg/dL — ABNORMAL HIGH (ref 70–99)
Glucose-Capillary: 143 mg/dL — ABNORMAL HIGH (ref 70–99)
Glucose-Capillary: 150 mg/dL — ABNORMAL HIGH (ref 70–99)
Glucose-Capillary: 167 mg/dL — ABNORMAL HIGH (ref 70–99)
Glucose-Capillary: 169 mg/dL — ABNORMAL HIGH (ref 70–99)

## 2021-05-02 MED ORDER — BETHANECHOL CHLORIDE 10 MG PO TABS
5.0000 mg | ORAL_TABLET | Freq: Three times a day (TID) | ORAL | Status: DC
  Administered 2021-05-02 – 2021-05-04 (×8): 5 mg
  Filled 2021-05-02 (×8): qty 1

## 2021-05-02 MED ORDER — RIFAXIMIN 550 MG PO TABS
550.0000 mg | ORAL_TABLET | Freq: Two times a day (BID) | ORAL | Status: DC
  Filled 2021-05-02: qty 1

## 2021-05-02 MED ORDER — HEPARIN (PORCINE) 25000 UT/250ML-% IV SOLN: 950.0000 [IU]/h | INTRAVENOUS | Status: DC

## 2021-05-02 MED ORDER — ONDANSETRON 4 MG PO TBDP: 8.0000 mg | ORAL_TABLET | Freq: Three times a day (TID) | ORAL | Status: DC | PRN

## 2021-05-02 MED ORDER — FENTANYL CITRATE (PF) 100 MCG/2ML IJ SOLN: 12.5000 ug | INTRAMUSCULAR | Status: DC | PRN

## 2021-05-02 MED ORDER — ENOXAPARIN SODIUM 30 MG/0.3ML IJ SOSY
30.0000 mg | PREFILLED_SYRINGE | Freq: Two times a day (BID) | INTRAMUSCULAR | Status: DC
  Administered 2021-05-02: 30 mg via SUBCUTANEOUS
  Filled 2021-05-02: qty 0.3

## 2021-05-02 MED ORDER — ATROPINE SULFATE 1 MG/10ML IJ SOSY
PREFILLED_SYRINGE | INTRAMUSCULAR | Status: AC
  Filled 2021-05-02: qty 10

## 2021-05-02 MED ORDER — SODIUM CHLORIDE 0.9 % IV SOLN
50.0000 ug/h | INTRAVENOUS | Status: DC
  Administered 2021-05-02 – 2021-05-05 (×6): 50 ug/h via INTRAVENOUS
  Filled 2021-05-02 (×8): qty 1

## 2021-05-02 MED ORDER — HEPARIN (PORCINE) 25000 UT/250ML-% IV SOLN
1500.0000 [IU]/h | INTRAVENOUS | Status: DC
  Administered 2021-05-02 – 2021-05-03 (×2): 950 [IU]/h via INTRAVENOUS
  Administered 2021-05-04 – 2021-05-05 (×2): 1500 [IU]/h via INTRAVENOUS
  Filled 2021-05-02 (×4): qty 250

## 2021-05-02 MED ORDER — RIFAXIMIN 550 MG PO TABS
550.0000 mg | ORAL_TABLET | Freq: Two times a day (BID) | ORAL | Status: DC
  Administered 2021-05-02 – 2021-05-03 (×4): 550 mg
  Filled 2021-05-02 (×4): qty 1

## 2021-05-02 MED ORDER — FENTANYL CITRATE (PF) 100 MCG/2ML IJ SOLN
12.5000 ug | Freq: Once | INTRAMUSCULAR | Status: AC
  Administered 2021-05-02: 12.5 ug via INTRAVENOUS
  Filled 2021-05-02: qty 2

## 2021-05-02 MED ORDER — ONDANSETRON HCL 4 MG PO TABS: 8.0000 mg | ORAL_TABLET | Freq: Three times a day (TID) | ORAL | Status: DC | PRN

## 2021-05-02 NOTE — Progress Notes (Signed)
[place] Progress Note Patient Name: Courtney Braun DOB: Dec 25, 1950 MRN: [id number]   Date of Service  05/02/2021  HPI/Events of Note  Bradycardia - HR = 59 and BP = 99/48 with MAP = 64. Precedex IV infusion has been off since 9 PM. Etiology not certain - Vasovagal event d/t Foley placement vs Hyperkalemia vs severe acidosis.   eICU Interventions  Plan: Send AM BMP and ABG now.      Intervention Category Major Interventions: Arrhythmia - evaluation and management  Tishie Altmann Cornelia Copa 05/02/2021, 12:51 AM

## 2021-05-02 NOTE — Progress Notes (Signed)
[place] Progress Note Patient Name: Courtney Braun DOB: [birth year] MRN: [id number]   Date of Service  05/02/2021  HPI/Events of Note  ABG on 40%/PRVC 26/TV 350/P 5 = 7.437/48.4/129/33.0. K+ on ABG = 4.0.   eICU Interventions  Continue present ventilator management.      Intervention Category Major Interventions: Arrhythmia - evaluation and management  Tessia Kassin Eugene 05/02/2021, 2:15 AM

## 2021-05-02 NOTE — Progress Notes (Signed)
[facility]astroenterology Progress Note  Courtney Braun 71 y.o. [birth year]  CC: Decompensated cirrhosis   Subjective: Patient seen and examined at bedside.  Yesterday's events noted.  Critical care was trying to wean patient today.  Patient's niece at bedside.  No bleeding episodes.  ROS : Not able to obtain   Objective: Vital signs in last 24 hours: Vitals:   05/02/21 0700 05/02/21 0819  BP:  (!) 111/43  Pulse:  62  Resp:  16  Temp: 97.6 F (36.4 C)   SpO2:  98%    Physical Exam:  General.   Intubated, agitated. Abdomen : Moderately distended, bowel sounds present, not able to appreciate tenderness. Lower extremity : no edema   Lab Results: Recent Labs    04/30/21 1811 05/01/21 0549 05/02/21 0145 05/02/21 0147  NA  --  140 143 143  K  --  4.3 4.2 4.0  CL  --  105 107  --   CO2  --  28 25  --   GLUCOSE  --  167* 143*  --   BUN  --  48* 49*  --   CREATININE  --  1.00 0.97  --   CALCIUM  --  8.8* 9.3  --   MG 2.1 2.1  --   --   PHOS 4.8* 4.2  --   --    Recent Labs    04/30/21 0718  AST 39  ALT 23  ALKPHOS 62  BILITOT 2.3*  PROT 5.9*  ALBUMIN 2.9*   Recent Labs    04/30/21 0718 05/02/21 0147  WBC 17.9*  --   HGB 10.0* 8.2*  HCT 31.3* 24.0*  MCV 97.8  --   PLT PLATELET CLUMPS NOTED ON SMEAR, UNABLE TO ESTIMATE  --    Recent Labs    04/29/21 1527 04/30/21 0718  LABPROT 23.8* 22.3*  INR 2.1* 2.0*      Assessment/Plan: -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 MELD 18 as of 02/12 -Gastric varices with splenorenal shunt.  Portal vein thrombosis.   -Respiratory failure.  Intubated.  Recommendation ----------------------- -Discussed with ICU nurse and critical care team at bedside.  - Per Dr. Serafina Royals from IR - technically Courtney Braun can be a candidate for attempt at TIPS with main portal vein venoplasty and possible stenting to give her a chance at decreased portal hypertension. I would only consider this as a  long term approach to managing recurrent hepatic hydrothorax, or acutely if Courtney Braun has hemorrhage from her large gastric varices.    -Repeat CMP and INR in the morning.   -Add octreotide  -GI will follow   Otis Brace MD, FACP 05/02/2021, 8:46 AM  Contact #  [phone]

## 2021-05-02 NOTE — Progress Notes (Signed)
ANTICOAGULATION CONSULT NOTE - Initial Consult  Pharmacy Consult for Heparin Indication: Portal Vein Thrombosis  Allergies  Allergen Reactions   Metformin And Related     Upset stomach   Voltaren [Diclofenac]     diarrhea    Patient Measurements: Height: 5' 5.5" (166.4 cm) Weight: 111.5 kg (245 lb 13 oz) IBW/kg (Calculated) : 58.15 Heparin Dosing Weight: 84.3 kg  Vital Signs: Temp: 99 F (37.2 C) (02/14 1100) Temp Source: Axillary (02/14 1100) BP: 109/46 (02/14 1040) Pulse Rate: 62 (02/14 1040)  Labs: Recent Labs    04/29/21 1527 04/29/21 1655 04/30/21 0355 04/30/21 0718 05/01/21 0549 05/02/21 0145 05/02/21 0147  HGB  --    < > 8.8* 10.0*  --   --  8.2*  HCT  --    < > 26.0* 31.3*  --   --  24.0*  PLT  --   --   --  PLATELET CLUMPS NOTED ON SMEAR, UNABLE TO ESTIMATE  --   --   --   LABPROT 23.8*  --   --  22.3*  --   --   --   INR 2.1*  --   --  2.0*  --   --   --   CREATININE  --   --   --  1.09* 1.00 0.97  --    < > = values in this interval not displayed.    Estimated Creatinine Clearance: 64.8 mL/min (by C-G formula based on SCr of 0.97 mg/dL).   Medical History: Past Medical History:  Diagnosis Date   Anemia    Diabetes mellitus without complication ([place])    type II   History of blood transfusion    Hyperlipidemia    Hypertension     Assessment: Courtney Braun is a 71 year old female with a history of NASH cirrhosis complicated by varices and hepatic hydrothorax. She has a portal vein thrombosis and pharmacy is consulted to dose heparin. We will aim for the lower end goal with non-bleeding varices.  Goal of Therapy:  Heparin level 0.3-0.5 Monitor platelets by anticoagulation protocol: Yes   Plan:  Start heparin infusion at 950 units/hr Monitor CBC and HL Monitor for s/sx of bleeding Heparin level in 8 hours  Lestine Box, PharmD PGY2 Infectious Diseases Pharmacy Resident   Please check [email or web address] for unit-specific pharmacy phone  numbers

## 2021-05-02 NOTE — Progress Notes (Signed)
NAME:  Courtney Braun, MRN:  [id number], DOB:  04-Feb-1951, LOS: 5 ADMISSION DATE:  04/27/2021, CONSULTATION DATE: 04/29/2021 REFERRING MD: Dr. Pietro Cassis, CHIEF COMPLAINT: Acute respiratory failure  History of Present Illness:  71 year old woman with a history of Courtney Braun cirrhosis complicated by esophageal varices, chronic anemia and right hepatic hydrothorax.  Also with a history of diabetes, hypertension, hyperlipidemia. Admitted 2/9 with progressive abdominal distention and associated dyspnea.  She has had an overall decline in her health over about 1 year.  Diuretic dosing has been adjusted as an outpatient but symptoms continue to progress.  Admitted for further care.  She underwent a right thoracentesis on 2/10.  Per RN she was doing well on 4 L/min morning of 2/11, then developed a sudden decline in respiratory status, more dyspnea, more hypoxemia and increased cough with yellow secretions.  Now on 15 L/min.  Chest x-ray reviewed, shows increasing bilateral alveolar infiltrates, particularly on the right.  The right pleural effusion is smaller than at presentation, has not increased significantly since thoracentesis.  Lasix given, empiric ceftriaxone started.  PCCM consulted to eval. She will move to ICU for further care  Pertinent  Medical History   Past Medical History:  Diagnosis Date   Anemia    Diabetes mellitus without complication ([place])    type II   History of blood transfusion    Hyperlipidemia    Hypertension     Significant Hospital Events: Including procedures, antibiotic start and stop dates in addition to other pertinent events   CT chest 2/9 >> large right pleural effusion with compressive atelectasis of the right lower and right middle lobes.  Marked hepatic cirrhosis with severe portal hypertension and evidence for large perigastric and gastric varices.  Scattered patchy left upper lobe groundglass infiltrates Chest x-ray 2/11 >> progression of bilateral alveolar infiltrates,  no real increase in size of pleural effusions since thoracentesis Pleural fluid 2/10 >> transudate.  WBC 97 (22 N, 57L, 21 M, 0E).  Gram stain negative TTE 2/11 >> CT Angio A/P for TIPS eval on 2/12 shows chronic portal venous thrombosis, hepatic hydrothorax, gastrorenal shunting, bilateral patchy airspace opacities consistent with pneumonia.  Interim History / Subjective:  Received a foley cath last night   Objective   Blood pressure (!) 114/45, pulse (!) 46, temperature (!) 97.4 F (36.3 C), temperature source Axillary, resp. rate (!) 26, height 5' 5.5" (1.664 m), weight 111.5 kg, SpO2 98 %.    Vent Mode: PRVC FiO2 (%):  [40 %] 40 % Set Rate:  [26 bmp] 26 bmp Vt Set:  [350 mL] 350 mL PEEP:  [5 cmH20] 5 cmH20 Pressure Support:  [5 cmH20] 5 cmH20 Plateau Pressure:  [14 cmH20-18 cmH20] 14 cmH20   Intake/Output Summary (Last 24 hours) at 05/02/2021 0744 Last data filed at 05/02/2021 0400 Gross per 24 hour  Intake 966.49 ml  Output 1900 ml  Net -933.51 ml    Filed Weights   04/30/21 0500 05/01/21 0500 05/02/21 0500  Weight: 110.3 kg 111 kg 111.5 kg    Examination: Gen:      Intubated, sedated, acutely ill appearing HEENT:  ETT to vent Lungs:    sounds of mechanical ventilation auscultated breath sounds diminished,  improved bilateral rhonchi CV:         RRR, systolic murmur Abd:      Soft, nontender, active bowel sounds Ext:    No edema, extremities thin Skin:      Warm and dry; no rashes Neuro:  minimally sedated and responsive to stimuli, RASS -2  Resolved Hospital Problem list     Assessment & Plan:  Acute hypoxemic respiratory failure Hepatic Hydrothorax Diffuse bilateral pulmonary infiltrates on CT concerning for HCAP.  - Remains afebrile, improved breath sounds, decreased endotracheal secretions,  - discontinue vanc - switch from cefepime to ceftriaxone tomorrow  - ddimer elevatd in the setting of portal venous thrombosis - continue diuresis with lasix,  aldactone - continue prn bronchodilators - reduce sedation and plan to extubate  Septic Shock - on low dose norepinephrine - abx - titrate down as tolerated for goal MAP>65  Decompensated Nash cirrhosis.   With portal gastropathy, esophageal and gastric varices and hepatic hydrothorax. IR consulted and determined patient is not a candidate for TIPS given thrombosed portal vein. Continue with medical management. - MELD-Na Score 18 points - GI following, IR consulted for TIPS evaluation. Continue to appreciate recommendations - continue diuresis with lasix and aldactone as ordered - Continue with palliative care and hospice  Coagulopathy of liver disease - No apparent bleed - Check INR tomorrow - consider FFP if she needs procedures  History of hepatic encephalopathy - On lactulose at home, continue - discontinued rifaximin - hold bowel regimen given copious stool output  Anemia of Chronic disease -following CBC -transfuse for Hb<7 or hemodynamically significant bleeding  Diabetes with hyperglycemia -SSI PRN -goal BG 140-180 if needing insulin   Best Practice (right click and "Reselect all SmartList Selections" daily)   Diet/type: NPO w/ meds via tube DVT prophylaxis: SCD GI prophylaxis: PPI Lines: N/A Foley:  N/A Code Status:  full code Last date of multidisciplinary goals of care discussion [discussed with patient 2/11. Niece updated at bedside 04/30/21]  The patient is critically ill due to septic shock, respiratory failure.  Critical care was necessary to treat or prevent imminent or life-threatening deterioration.  Critical care was time spent personally by me on the following activities: development of treatment plan with patient and/or surrogate as well as nursing, discussions with consultants, evaluation of patient's response to treatment, examination of patient, obtaining history from patient or surrogate, ordering and performing treatments and interventions,  ordering and review of laboratory studies, ordering and review of radiographic studies, pulse oximetry, re-evaluation of patient's condition and participation in multidisciplinary rounds.   Critical Care Time devoted to patient care services described in this note is 40 minutes. This time reflects time of care of this [place] . This critical care time does not reflect separately billable procedures or procedure time, teaching time or supervisory time of PA/NP/Med student/Med Resident etc but could involve care discussion time.       Ayjah Show A  Samon Dishner    Labs   CBC: Recent Labs  Lab 04/27/21 1229 04/27/21 1255 04/29/21 0222 04/29/21 1655 04/30/21 0355 04/30/21 0718 05/02/21 0147  WBC 12.0*  --  16.9*  --   --  17.9*  --   NEUTROABS 9.9*  --  14.0*  --   --   --   --   HGB 11.6*   < > 9.8* 9.5* 8.8* 10.0* 8.2*  HCT 34.5*   < > 30.9* 28.0* 26.0* 31.3* 24.0*  MCV 93.5  --  96.6  --   --  97.8  --   PLT PLATELET CLUMPS NOTED ON SMEAR, UNABLE TO ESTIMATE  --  114*  --   --  PLATELET CLUMPS NOTED ON SMEAR, UNABLE TO ESTIMATE  --    < > = values  in this interval not displayed.     Basic Metabolic Panel: Recent Labs  Lab 04/28/21 0329 04/29/21 0222 04/29/21 1628 04/29/21 1655 04/30/21 0355 04/30/21 0718 04/30/21 1811 05/01/21 0549 05/02/21 0145 05/02/21 0147  NA 138 139  --    < > 142 140  --  140 143 143  K 3.9 4.1  --    < > 3.9 4.2  --  4.3 4.2 4.0  CL 103 104  --   --   --  106  --  105 107  --   CO2 27 26  --   --   --  26  --  28 25  --   GLUCOSE 121* 116*  --   --   --  138*  --  167* 143*  --   BUN 20 26*  --   --   --  38*  --  48* 49*  --   CREATININE 0.75 0.81  --   --   --  1.09*  --  1.00 0.97  --   CALCIUM 8.4* 8.8*  --   --   --  8.8*  --  8.8* 9.3  --   MG  --   --  1.8  --   --  2.0 2.1 2.1  --   --   PHOS  --   --  4.1  --   --  3.9 4.8* 4.2  --   --    < > = values in this interval not displayed.    GFR: Estimated Creatinine  Clearance: 64.8 mL/min (by C-G formula based on SCr of 0.97 mg/dL). Recent Labs  Lab 04/27/21 1229 04/29/21 0222 04/29/21 1527 04/30/21 0718 05/01/21 0549  PROCALCITON  --  0.24  --  0.47 0.46  WBC 12.0* 16.9*  --  17.9*  --   LATICACIDVEN  --   --  1.6 1.2  --      Liver Function Tests: Recent Labs  Lab 04/27/21 1229 04/30/21 0718  AST 63* 39  ALT 42 23  ALKPHOS 99 62  BILITOT 2.4* 2.3*  PROT 6.4* 5.9*  ALBUMIN 2.2* 2.9*    No results for input(s): LIPASE, AMYLASE in the last 168 hours. Recent Labs  Lab 04/27/21 1815  AMMONIA 30     ABG    Component Value Date/Time   PHART 7.437 05/02/2021 0147   PCO2ART 48.4 (H) 05/02/2021 0147   PO2ART 129 (H) 05/02/2021 0147   HCO3 33.0 (H) 05/02/2021 0147   TCO2 34 (H) 05/02/2021 0147   O2SAT 99.0 05/02/2021 0147      Coagulation Profile: Recent Labs  Lab 04/27/21 1637 04/28/21 0329 04/29/21 1527 04/30/21 0718  INR 1.6* 1.7* 2.1* 2.0*     Cardiac Enzymes: No results for input(s): CKTOTAL, CKMB, CKMBINDEX, TROPONINI in the last 168 hours.  HbA1C: Hgb A1c MFr Bld  Date/Time Value Ref Range Status  04/28/2021 03:29 AM 4.3 (L) 4.8 - 5.6 % Final    Comment:    (NOTE) Pre diabetes:          5.7%-6.4%  Diabetes:              >6.4%  Glycemic control for   <7.0% adults with diabetes     CBG: Recent Labs  Lab 05/01/21 1528 05/01/21 1928 05/01/21 2318 05/02/21 0339 05/02/21 0736  GLUCAP 140* 154* 141* 150* 138*

## 2021-05-02 NOTE — Progress Notes (Addendum)
[facility] Progress Note Patient Name: Courtney Braun DOB: [birth year] MRN: [id number]   Date of Service  05/02/2021  HPI/Events of Note  Agitation and c/o pain - No sedation or analgesia ordered.   eICU Interventions  Plan: Fentanyl 12.5 mcg IV X 1 now.     Intervention Category Major Interventions: Delirium, psychosis, severe agitation - evaluation and management;Other:  Lysle Dingwall 05/02/2021, 8:52 PM

## 2021-05-02 NOTE — Progress Notes (Signed)
[facility] Progress Note Patient Name: LEIGHANA NEYMAN DOB: 1950/07/29 MRN: [id number]   Date of Service  05/02/2021  HPI/Events of Note  Agitation - Nursing request for bilateral soft wrist restraints.   eICU Interventions  Will order bilateral soft wrist restraints X 8 hours.      Intervention Category Major Interventions: Delirium, psychosis, severe agitation - evaluation and management  Joann Jorge Eugene 05/02/2021, 1:31 AM

## 2021-05-03 DIAGNOSIS — Z9911 Dependence on respirator [ventilator] status: Secondary | ICD-10-CM | POA: Diagnosis not present

## 2021-05-03 DIAGNOSIS — J9601 Acute respiratory failure with hypoxia: Secondary | ICD-10-CM | POA: Diagnosis not present

## 2021-05-03 DIAGNOSIS — R188 Other ascites: Secondary | ICD-10-CM | POA: Diagnosis not present

## 2021-05-03 DIAGNOSIS — K7460 Unspecified cirrhosis of liver: Secondary | ICD-10-CM | POA: Diagnosis not present

## 2021-05-03 LAB — COMPREHENSIVE METABOLIC PANEL
ALT: 25 U/L (ref 0–44)
AST: 35 U/L (ref 15–41)
Albumin: 2.8 g/dL — ABNORMAL LOW (ref 3.5–5.0)
Alkaline Phosphatase: 63 U/L (ref 38–126)
Anion gap: 6 (ref 5–15)
BUN: 48 mg/dL — ABNORMAL HIGH (ref 8–23)
CO2: 31 mmol/L (ref 22–32)
Calcium: 8.7 mg/dL — ABNORMAL LOW (ref 8.9–10.3)
Chloride: 108 mmol/L (ref 98–111)
Creatinine, Ser: 0.86 mg/dL (ref 0.44–1.00)
GFR, Estimated: >60 mL/min (ref >60)
Glucose, Bld: 127 mg/dL — ABNORMAL HIGH (ref 70–99)
Potassium: 4.1 mmol/L (ref 3.5–5.1)
Sodium: 145 mmol/L (ref 135–145)
Total Bilirubin: 2.2 mg/dL — ABNORMAL HIGH (ref 0.3–1.2)
Total Protein: 6.5 g/dL (ref 6.5–8.1)

## 2021-05-03 LAB — GLUCOSE, CAPILLARY
Glucose-Capillary: 114 mg/dL — ABNORMAL HIGH (ref 70–99)
Glucose-Capillary: 120 mg/dL — ABNORMAL HIGH (ref 70–99)
Glucose-Capillary: 131 mg/dL — ABNORMAL HIGH (ref 70–99)
Glucose-Capillary: 132 mg/dL — ABNORMAL HIGH (ref 70–99)
Glucose-Capillary: 157 mg/dL — ABNORMAL HIGH (ref 70–99)
Glucose-Capillary: 164 mg/dL — ABNORMAL HIGH (ref 70–99)

## 2021-05-03 LAB — CBC
HCT: 31.8 % — ABNORMAL LOW (ref 36.0–46.0)
Hemoglobin: 10 g/dL — ABNORMAL LOW (ref 12.0–15.0)
MCH: 30.6 pg (ref 26.0–34.0)
MCHC: 31.4 g/dL (ref 30.0–36.0)
MCV: 97.2 fL (ref 80.0–100.0)
Platelets: 101 10*3/uL — ABNORMAL LOW (ref 150–400)
RBC: 3.27 MIL/uL — ABNORMAL LOW (ref 3.87–5.11)
RDW: 16.3 % — ABNORMAL HIGH (ref 11.5–15.5)
WBC: 9.4 10*3/uL (ref 4.0–10.5)
nRBC: 0 % (ref 0.0–0.2)

## 2021-05-03 LAB — CULTURE, BODY FLUID W GRAM STAIN -BOTTLE: Culture: NO GROWTH

## 2021-05-03 LAB — PROTIME-INR
INR: 1.8 — ABNORMAL HIGH (ref 0.8–1.2)
Prothrombin Time: 21 seconds — ABNORMAL HIGH (ref 11.4–15.2)

## 2021-05-03 LAB — HEPARIN LEVEL (UNFRACTIONATED): Heparin Unfractionated: 0.38 IU/mL (ref 0.30–0.70)

## 2021-05-03 MED ORDER — GERHARDT'S BUTT CREAM
TOPICAL_CREAM | Freq: Three times a day (TID) | CUTANEOUS | Status: DC
  Administered 2021-05-03 – 2021-05-07 (×3): 1 via TOPICAL
  Filled 2021-05-03 (×3): qty 1

## 2021-05-03 MED ORDER — MELATONIN 3 MG PO TABS
3.0000 mg | ORAL_TABLET | Freq: Every evening | ORAL | Status: DC | PRN
  Administered 2021-05-04: 3 mg
  Filled 2021-05-03: qty 1

## 2021-05-03 MED ORDER — FENTANYL CITRATE (PF) 100 MCG/2ML IJ SOLN
12.5000 ug | Freq: Once | INTRAMUSCULAR | Status: AC
  Administered 2021-05-03: 12.5 ug via INTRAVENOUS
  Filled 2021-05-03: qty 2

## 2021-05-03 MED ORDER — LACTULOSE 10 GM/15ML PO SOLN
10.0000 g | Freq: Two times a day (BID) | ORAL | Status: DC
  Administered 2021-05-03: 10 g
  Filled 2021-05-03 (×2): qty 15

## 2021-05-03 MED ORDER — SODIUM CHLORIDE 0.9 % IV SOLN
2.0000 g | INTRAVENOUS | Status: AC
  Administered 2021-05-03 – 2021-05-05 (×3): 2 g via INTRAVENOUS
  Filled 2021-05-03 (×3): qty 20

## 2021-05-03 NOTE — Progress Notes (Signed)
[facility] Gastroenterology Progress Note  ZELLA DEWAN 71 y.o. 1950-09-03  CC:  Decompensated cirrhosis   Subjective: Patient remains intubated and sedated.  Nurses at bedside states patient is having multiple loose stools per day without overt bleeding.  ROS : Review of Systems  Unable to perform ROS: Intubated     Objective: Vital signs in last 24 hours: Vitals:   05/03/21 1200 05/03/21 1206  BP:    Pulse: 91 90  Resp: 15 13  Temp: 98.5 F (36.9 C)   SpO2: 98% 100%    Physical Exam:  General:  Intubated, no acute distress  Head:  Normocephalic, without obvious abnormality, atraumatic           Lungs: No wheezing  Heart:  Regular rate and rhythm, S1, S2 normal  Abdomen:   Soft, mildly distended, bowel sounds present, left-sided tenderness to palpation  Extremities: Extremities normal, atraumatic, no edema       Lab Results: Recent Labs    04/30/21 1811 05/01/21 0549 05/01/21 0549 05/02/21 0145 05/02/21 0147 05/03/21 0656  NA  --  140   < > 143 143 145  K  --  4.3   < > 4.2 4.0 4.1  CL  --  105   < > 107  --  108  CO2  --  28   < > 25  --  31  GLUCOSE  --  167*   < > 143*  --  127*  BUN  --  48*   < > 49*  --  48*  CREATININE  --  1.00   < > 0.97  --  0.86  CALCIUM  --  8.8*   < > 9.3  --  8.7*  MG 2.1 2.1  --   --   --   --   PHOS 4.8* 4.2  --   --   --   --    < > = values in this interval not displayed.   Recent Labs    05/03/21 0656  AST 35  ALT 25  ALKPHOS 63  BILITOT 2.2*  PROT 6.5  ALBUMIN 2.8*   Recent Labs    05/02/21 0147 05/03/21 0656  WBC  --  9.4  HGB 8.2* 10.0*  HCT 24.0* 31.8*  MCV  --  97.2  PLT  --  101*   Recent Labs    05/03/21 0656  LABPROT 21.0*  INR 1.8*      Assessment Decompensated NASH cirrhosis complicated by varices and hepatic hydrothorax - MELD Score: 16 as of 2/15 - AST 35, ALT 25, Alk phos 63, Tbili 2.2 - Hgb 10.0  Gastric varices with splenorenal shunt. Portal vein thrombosis  Respiratory  failure. Intubated.  Plan: Continue octreotide. Can decrease lactulose to 30g once a day. Continue other supportive care. [facility] will follow.  Angelique Holm PA-C 05/03/2021, 2:38 PM  Contact #  [phone]

## 2021-05-03 NOTE — Progress Notes (Signed)
[facility] Progress Note Patient Name: Courtney Braun DOB: 01-27-1951 MRN: [id number]   Date of Service  05/03/2021  HPI/Events of Note  Agitation - Nursing states that the patient is crying in pain and breathing over the ventilator.   eICU Interventions  Plan: Fentanyl 12.5 mcg IV X 1 now.      Intervention Category Major Interventions: Delirium, psychosis, severe agitation - evaluation and management  [place] Eugene 05/03/2021, 1:33 AM

## 2021-05-03 NOTE — Progress Notes (Addendum)
ANTICOAGULATION CONSULT NOTE - Initial Consult  Pharmacy Consult for Heparin Indication: Portal Vein Thrombosis  Allergies  Allergen Reactions   Metformin And Related     Upset stomach   Voltaren [Diclofenac]     diarrhea    Patient Measurements: Height: 5' 5.5" (166.4 cm) Weight: 109.1 kg (240 lb 8.4 oz) IBW/kg (Calculated) : 58.15 Heparin Dosing Weight: 84.3 kg  Vital Signs: Temp: 98.9 F (37.2 C) (02/15 0400) Temp Source: Oral (02/15 0400) BP: 126/60 (02/15 0515) Pulse Rate: 83 (02/15 0515)  Labs: Recent Labs    05/01/21 0549 05/02/21 0145 05/02/21 0147 05/03/21 0656  HGB  --   --  8.2* 10.0*  HCT  --   --  24.0* 31.8*  PLT  --   --   --  PENDING  LABPROT  --   --   --  21.0*  INR  --   --   --  1.8*  HEPARINUNFRC  --   --   --  0.38  CREATININE 1.00 0.97  --  0.86     Estimated Creatinine Clearance: 72.3 mL/min (by C-G formula based on SCr of 0.86 mg/dL).   Medical History: Past Medical History:  Diagnosis Date   Anemia    Diabetes mellitus without complication ([place])    type II   History of blood transfusion    Hyperlipidemia    Hypertension     Assessment: Courtney Braun is a 71 year old female with a history of NASH cirrhosis complicated by varices and hepatic hydrothorax. She has a portal vein thrombosis and pharmacy is consulted to dose heparin. We will aim for the lower end goal with non-bleeding varices.  Heparin level on 2/15 @ 06:56 returned at goal at 0.38. No issues during infusion and no signs or symptoms of bleeding per RN.   Goal of Therapy:  Heparin level 0.3-0.5 Monitor platelets by anticoagulation protocol: Yes   Plan:  Continue heparin infusion at 950 units/h Monitor CBC and Daily HL Monitor for s/sx of bleeding   Courtney Braun, PharmD PGY2 Infectious Diseases Pharmacy Resident   Please check [email or web address] for unit-specific pharmacy phone numbers

## 2021-05-03 NOTE — Progress Notes (Signed)
Niece updated at bedside. Making progress but still too sedated to extubate as she is not showing consistent signs of protecting her airway.  Julian Hy, DO 05/03/21 1:19 PM [facility]

## 2021-05-03 NOTE — Consult Note (Addendum)
[facility] Nurse Consult Note: Reason for Consult: Consult requested for groin/perineum.  Performed remotely after review of progress notes in the EMR.  Pt is critically ill with multiple systemic factors which can impair healing.  They are frequently incontinent of loose stools related to lactulose. Inner groin/perineum is red and moist with partial thickness skin loss, according to bedside nurse wound flow sheet. This is consistent with moisture associated skin damage.  ICD-10 CM Codes for Irritant Dermatitis L24A2 - Due to fecal, urinary or dual incontinence  Dressing procedure/placement/frequency: Pt is on a low airloss mattress to decrease pressure. It is difficult to keep the affected area from becoming soiled and promote healing. Topical treatment orders provided for bedside nurses to perform to protect skin and repel moisture: Apply Gerhardts cream to groin/perineum TID and PRN when turning and cleaning. Please re-consult if further assistance is needed.  Thank-you,  Julien Girt MSN, [place], [place], [place], [place]

## 2021-05-03 NOTE — Progress Notes (Addendum)
NAME:  Courtney Braun, MRN:  [id number], DOB:  1951-02-15, LOS: 61 ADMISSION DATE:  04/27/2021, CONSULTATION DATE: 04/29/2021 REFERRING MD: Dr. Pietro Cassis, CHIEF COMPLAINT: Acute respiratory failure  History of Present Illness:  71 year old woman with a history of Courtney Braun cirrhosis complicated by esophageal varices, chronic anemia and right hepatic hydrothorax.  Also with a history of diabetes, hypertension, hyperlipidemia. Admitted 2/9 with progressive abdominal distention and associated dyspnea.  She has had an overall decline in her health over about 1 year.  Diuretic dosing has been adjusted as an outpatient but symptoms continue to progress.  Admitted for further care.  She underwent a right thoracentesis on 2/10.  Per RN she was doing well on 4 L/min morning of 2/11, then developed a sudden decline in respiratory status, more dyspnea, more hypoxemia and increased cough with yellow secretions.  Now on 15 L/min.  Chest x-ray reviewed, shows increasing bilateral alveolar infiltrates, particularly on the right.  The right pleural effusion is smaller than at presentation, has not increased significantly since thoracentesis.  Lasix given, empiric ceftriaxone started.  PCCM consulted to eval. She will move to ICU for further care  Pertinent  Medical History   Past Medical History:  Diagnosis Date   Anemia    Diabetes mellitus without complication ([place])    type II   History of blood transfusion    Hyperlipidemia    Hypertension     Significant Hospital Events: Including procedures, antibiotic start and stop dates in addition to other pertinent events   CT chest 2/9 >> large right pleural effusion with compressive atelectasis of the right lower and right middle lobes.  Marked hepatic cirrhosis with severe portal hypertension and evidence for large perigastric and gastric varices.  Scattered patchy left upper lobe groundglass infiltrates Chest x-ray 2/11 >> progression of bilateral alveolar infiltrates,  no real increase in size of pleural effusions since thoracentesis Pleural fluid 2/10 >> transudate.  WBC 97 (22 N, 57L, 21 M, 0E).  Gram stain negative TTE 2/11 >> CT Angio A/P for TIPS eval on 2/12 shows chronic portal venous thrombosis, hepatic hydrothorax, gastrorenal shunting, bilateral patchy airspace opacities consistent with pneumonia.  Interim History / Subjective:  Awake through the night. Desychro  Objective   Blood pressure 126/60, pulse 98, temperature 98.9 F (37.2 C), temperature source Oral, resp. rate (!) 23, height 5' 5.5" (1.664 m), weight 109.1 kg, SpO2 99 %.    Vent Mode: PSV;CPAP FiO2 (%):  [40 %] 40 % Set Rate:  [26 bmp] 26 bmp Vt Set:  [350 mL] 350 mL PEEP:  [5 cmH20] 5 cmH20 Pressure Support:  [5 cmH20-8 cmH20] 5 cmH20 Plateau Pressure:  [0 cmH20-16 cmH20] 0 cmH20   Intake/Output Summary (Last 24 hours) at 05/03/2021 9449 Last data filed at 05/03/2021 0600 Gross per 24 hour  Intake 1542.21 ml  Output 1990 ml  Net -447.79 ml    Filed Weights   05/01/21 0500 05/02/21 0500 05/03/21 0500  Weight: 111 kg 111.5 kg 109.1 kg    Examination: Gen:      Intubated, sedated, acutely ill appearing HEENT:  ETT to vent Lungs:    sounds of mechanical ventilation auscultated breath sounds diminished,  bilat continuous wheezing CV:         RRR, systolic murmur Abd:      Soft, nontender, active bowel sounds Ext:    No edema, extremities thin Skin:      Warm and dry; no rashes Neuro:   minimally sedated and responsive  to stimuli, RASS -2  Resolved Hospital Problem list     Assessment & Plan:  Acute hypoxemic respiratory failure Hepatic Hydrothorax Diffuse bilateral pulmonary infiltrates on CT concerning for HCAP.  - Remains afebrile, improved breath sounds, decreased endotracheal secretions,  - d/c cefepime and start ceftriaxone 7 days - ddimer elevatd in the setting of portal venous thrombosis - continue diuresis with lasix, aldactone - continue prn  bronchodilators - reduce sedation and SBT today - monitor peak pressure & tidal volume closely  - Goal to extubate and have discussion on plan of care  Septic Shock - on low dose norepinephrine - abx - titrate down as tolerated for goal MAP>65  Decompensated Nash cirrhosis.   With portal gastropathy, esophageal and gastric varices and hepatic hydrothorax. IR consulted and determined patient is not a candidate for TIPS except in case of life-threatening gastric variceal bleeding. Continue with medical management. - MELD-Na Score 18 points - GI following, IR consulted for TIPS evaluation. Continue to appreciate recommendations - continue diuresis with lasix and aldactone as ordered - Continue with palliative care and hospice  Coagulopathy of liver disease - No apparent bleed - Heparin .38 (goal 0.3-0.5) - consider FFP if she needs procedures  History of hepatic encephalopathy - On lactulose at home, continue - con't holding sedation & working towards extubation - hold bowel regimen given copious stool output  Anemia of Chronic disease -following CBC -transfuse for Hb<7 or hemodynamically significant bleeding  Diabetes with hyperglycemia - SSI PRN - goal BG 140-180 if needing insulin - resume tube feeds   Best Practice (right click and "Reselect all SmartList Selections" daily)   Diet/type: NPO w/ meds via tube DVT prophylaxis: SCD GI prophylaxis: PPI Lines: N/A Foley:  N/A Code Status:  full code Last date of multidisciplinary goals of care discussion [discussed with patient 2/11. Niece updated at bedside 04/30/21]  The patient is critically ill due to septic shock, respiratory failure.  Critical care was necessary to treat or prevent imminent or life-threatening deterioration.  Critical care was time spent personally by me on the following activities: development of treatment plan with patient and/or surrogate as well as nursing, discussions with consultants, evaluation  of patient's response to treatment, examination of patient, obtaining history from patient or surrogate, ordering and performing treatments and interventions, ordering and review of laboratory studies, ordering and review of radiographic studies, pulse oximetry, re-evaluation of patient's condition and participation in multidisciplinary rounds.   Critical Care Time devoted to patient care services described in this note is 40 minutes. This time reflects time of care of this [place] . This critical care time does not reflect separately billable procedures or procedure time, teaching time or supervisory time of PA/NP/Med student/Med Resident etc but could involve care discussion time.       Derriona Branscom A  Willma Obando    Labs   CBC: Recent Labs  Lab 04/27/21 1229 04/27/21 1255 04/29/21 0222 04/29/21 1655 04/30/21 0355 04/30/21 0718 05/02/21 0147 05/03/21 0656  WBC 12.0*  --  16.9*  --   --  17.9*  --  9.4  NEUTROABS 9.9*  --  14.0*  --   --   --   --   --   HGB 11.6*   < > 9.8* 9.5* 8.8* 10.0* 8.2* 10.0*  HCT 34.5*   < > 30.9* 28.0* 26.0* 31.3* 24.0* 31.8*  MCV 93.5  --  96.6  --   --  97.8  --  97.2  PLT PLATELET CLUMPS NOTED ON SMEAR, UNABLE TO ESTIMATE  --  114*  --   --  PLATELET CLUMPS NOTED ON SMEAR, UNABLE TO ESTIMATE  --  101*   < > = values in this interval not displayed.     Basic Metabolic Panel: Recent Labs  Lab 04/29/21 0222 04/29/21 1628 04/29/21 1655 04/30/21 0718 04/30/21 1811 05/01/21 0549 05/02/21 0145 05/02/21 0147 05/03/21 0656  NA 139  --    < > 140  --  140 143 143 145  K 4.1  --    < > 4.2  --  4.3 4.2 4.0 4.1  CL 104  --   --  106  --  105 107  --  108  CO2 26  --   --  26  --  28 25  --  31  GLUCOSE 116*  --   --  138*  --  167* 143*  --  127*  BUN 26*  --   --  38*  --  48* 49*  --  48*  CREATININE 0.81  --   --  1.09*  --  1.00 0.97  --  0.86  CALCIUM 8.8*  --   --  8.8*  --  8.8* 9.3  --  8.7*  MG  --  1.8  --  2.0 2.1 2.1  --   --    --   PHOS  --  4.1  --  3.9 4.8* 4.2  --   --   --    < > = values in this interval not displayed.    GFR: Estimated Creatinine Clearance: 72.3 mL/min (by C-G formula based on SCr of 0.86 mg/dL). Recent Labs  Lab 04/27/21 1229 04/29/21 0222 04/29/21 1527 04/30/21 0718 05/01/21 0549 05/03/21 0656  PROCALCITON  --  0.24  --  0.47 0.46  --   WBC 12.0* 16.9*  --  17.9*  --  9.4  LATICACIDVEN  --   --  1.6 1.2  --   --      Liver Function Tests: Recent Labs  Lab 04/27/21 1229 04/30/21 0718 05/03/21 0656  AST 63* 39 35  ALT 42 23 25  ALKPHOS 99 62 63  BILITOT 2.4* 2.3* 2.2*  PROT 6.4* 5.9* 6.5  ALBUMIN 2.2* 2.9* 2.8*    No results for input(s): LIPASE, AMYLASE in the last 168 hours. Recent Labs  Lab 04/27/21 1815  AMMONIA 30     ABG    Component Value Date/Time   PHART 7.437 05/02/2021 0147   PCO2ART 48.4 (H) 05/02/2021 0147   PO2ART 129 (H) 05/02/2021 0147   HCO3 33.0 (H) 05/02/2021 0147   TCO2 34 (H) 05/02/2021 0147   O2SAT 99.0 05/02/2021 0147      Coagulation Profile: Recent Labs  Lab 04/27/21 1637 04/28/21 0329 04/29/21 1527 04/30/21 0718 05/03/21 0656  INR 1.6* 1.7* 2.1* 2.0* 1.8*     Cardiac Enzymes: No results for input(s): CKTOTAL, CKMB, CKMBINDEX, TROPONINI in the last 168 hours.  HbA1C: Hgb A1c MFr Bld  Date/Time Value Ref Range Status  04/28/2021 03:29 AM 4.3 (L) 4.8 - 5.6 % Final    Comment:    (NOTE) Pre diabetes:          5.7%-6.4%  Diabetes:              >6.4%  Glycemic control for   <7.0% adults with diabetes     CBG: Recent Labs  Lab 05/02/21 1521 05/02/21  1947 05/02/21 2330 05/03/21 0325 05/03/21 0740  GLUCAP 142* 167* 143* 120* 114*

## 2021-05-03 NOTE — Progress Notes (Signed)
[facility] Progress Note Patient Name: Courtney Braun DOB: 06-05-1950 MRN: [id number]   Date of Service  05/03/2021  HPI/Events of Note  Nursing reports that the patient is fighting vent and trying to pull out ETT.  eICU Interventions  Plan: Fentanyl 12.5 mcg IV X 1 now.      Intervention Category Major Interventions: Delirium, psychosis, severe agitation - evaluation and management  Lether Tesch Eugene 05/03/2021, 3:09 AM

## 2021-05-03 NOTE — Progress Notes (Signed)
[facility] Progress Note Patient Name: Courtney Braun DOB: 1950-05-09 MRN: [id number]   Date of Service  05/03/2021  HPI/Events of Note  Bedside nurse requesting PRN for sleep or anxiety.  eICU Interventions  Plan: Melatonin 3 mg per tube Q HS PRN sleep.     Intervention Category Major Interventions: Other:  Lysle Dingwall 05/03/2021, 11:42 PM

## 2021-05-04 ENCOUNTER — Other Ambulatory Visit: Payer: Medicare HMO

## 2021-05-04 DIAGNOSIS — J181 Lobar pneumonia, unspecified organism: Secondary | ICD-10-CM | POA: Diagnosis not present

## 2021-05-04 DIAGNOSIS — J9601 Acute respiratory failure with hypoxia: Secondary | ICD-10-CM | POA: Diagnosis not present

## 2021-05-04 DIAGNOSIS — K7682 Hepatic encephalopathy: Secondary | ICD-10-CM

## 2021-05-04 DIAGNOSIS — Z9911 Dependence on respirator [ventilator] status: Secondary | ICD-10-CM | POA: Diagnosis not present

## 2021-05-04 DIAGNOSIS — Z7189 Other specified counseling: Secondary | ICD-10-CM | POA: Diagnosis not present

## 2021-05-04 LAB — CBC
HCT: 31 % — ABNORMAL LOW (ref 36.0–46.0)
Hemoglobin: 9.9 g/dL — ABNORMAL LOW (ref 12.0–15.0)
MCH: 30.8 pg (ref 26.0–34.0)
MCHC: 31.9 g/dL (ref 30.0–36.0)
MCV: 96.6 fL (ref 80.0–100.0)
Platelets: 63 10*3/uL — ABNORMAL LOW (ref 150–400)
RBC: 3.21 MIL/uL — ABNORMAL LOW (ref 3.87–5.11)
RDW: 15.9 % — ABNORMAL HIGH (ref 11.5–15.5)
WBC: 8.2 10*3/uL (ref 4.0–10.5)
nRBC: 0 % (ref 0.0–0.2)

## 2021-05-04 LAB — GLUCOSE, CAPILLARY
Glucose-Capillary: 160 mg/dL — ABNORMAL HIGH (ref 70–99)
Glucose-Capillary: 173 mg/dL — ABNORMAL HIGH (ref 70–99)
Glucose-Capillary: 132 mg/dL — ABNORMAL HIGH (ref 70–99)
Glucose-Capillary: 126 mg/dL — ABNORMAL HIGH (ref 70–99)
Glucose-Capillary: 233 mg/dL — ABNORMAL HIGH (ref 70–99)
Glucose-Capillary: 214 mg/dL — ABNORMAL HIGH (ref 70–99)

## 2021-05-04 LAB — CULTURE, BLOOD (ROUTINE X 2)
Culture: NO GROWTH
Culture: NO GROWTH

## 2021-05-04 LAB — BASIC METABOLIC PANEL
Anion gap: 9 (ref 5–15)
BUN: 43 mg/dL — ABNORMAL HIGH (ref 8–23)
CO2: 32 mmol/L (ref 22–32)
Calcium: 8.7 mg/dL — ABNORMAL LOW (ref 8.9–10.3)
Chloride: 107 mmol/L (ref 98–111)
Creatinine, Ser: 0.89 mg/dL (ref 0.44–1.00)
GFR, Estimated: >60 mL/min (ref >60)
Glucose, Bld: 168 mg/dL — ABNORMAL HIGH (ref 70–99)
Potassium: 4.8 mmol/L (ref 3.5–5.1)
Sodium: 148 mmol/L — ABNORMAL HIGH (ref 135–145)

## 2021-05-04 LAB — HEPARIN LEVEL (UNFRACTIONATED)
Heparin Unfractionated: <0.1 IU/mL — ABNORMAL LOW (ref 0.30–0.70)
Heparin Unfractionated: 0.16 IU/mL — ABNORMAL LOW (ref 0.30–0.70)
Heparin Unfractionated: 0.31 IU/mL (ref 0.30–0.70)

## 2021-05-04 MED ORDER — ADULT MULTIVITAMIN W/MINERALS CH
1.0000 | ORAL_TABLET | Freq: Every day | ORAL | Status: DC
  Administered 2021-05-05 – 2021-05-10 (×6): 1 via ORAL
  Filled 2021-05-04 (×6): qty 1

## 2021-05-04 MED ORDER — ONDANSETRON HCL 4 MG PO TABS: 8.0000 mg | ORAL_TABLET | Freq: Three times a day (TID) | ORAL | Status: DC | PRN

## 2021-05-04 MED ORDER — PANTOPRAZOLE SODIUM 40 MG PO TBEC
40.0000 mg | DELAYED_RELEASE_TABLET | Freq: Every day | ORAL | Status: DC
  Administered 2021-05-04 – 2021-05-10 (×7): 40 mg via ORAL
  Filled 2021-05-04 (×7): qty 1

## 2021-05-04 MED ORDER — MELATONIN 3 MG PO TABS
3.0000 mg | ORAL_TABLET | Freq: Every evening | ORAL | Status: DC | PRN
  Administered 2021-05-04: 3 mg via ORAL
  Filled 2021-05-04: qty 1

## 2021-05-04 MED ORDER — RIFAXIMIN 550 MG PO TABS
550.0000 mg | ORAL_TABLET | Freq: Two times a day (BID) | ORAL | Status: DC
  Administered 2021-05-04 – 2021-05-10 (×12): 550 mg via ORAL
  Filled 2021-05-04 (×12): qty 1

## 2021-05-04 MED ORDER — LACTULOSE 10 GM/15ML PO SOLN
10.0000 g | Freq: Two times a day (BID) | ORAL | Status: DC
  Administered 2021-05-04 – 2021-05-05 (×2): 10 g via ORAL
  Filled 2021-05-04 (×2): qty 15

## 2021-05-04 MED ORDER — SPIRONOLACTONE 25 MG PO TABS
100.0000 mg | ORAL_TABLET | Freq: Every day | ORAL | Status: DC
  Administered 2021-05-05 – 2021-05-10 (×6): 100 mg via ORAL
  Filled 2021-05-04: qty 4
  Filled 2021-05-04: qty 1
  Filled 2021-05-04 (×3): qty 4
  Filled 2021-05-04: qty 1

## 2021-05-04 MED ORDER — SIMVASTATIN 20 MG PO TABS
40.0000 mg | ORAL_TABLET | Freq: Every day | ORAL | Status: DC
  Administered 2021-05-04 – 2021-05-09 (×6): 40 mg via ORAL
  Filled 2021-05-04 (×7): qty 2

## 2021-05-04 MED ORDER — INSULIN ASPART 100 UNIT/ML IJ SOLN
1.0000 [IU] | Freq: Three times a day (TID) | INTRAMUSCULAR | Status: DC
  Administered 2021-05-04: 3 [IU] via SUBCUTANEOUS
  Administered 2021-05-05: 2 [IU] via SUBCUTANEOUS
  Administered 2021-05-05: 1 [IU] via SUBCUTANEOUS
  Administered 2021-05-05: 3 [IU] via SUBCUTANEOUS
  Administered 2021-05-06 – 2021-05-08 (×8): 2 [IU] via SUBCUTANEOUS
  Administered 2021-05-08 – 2021-05-09 (×2): 1 [IU] via SUBCUTANEOUS
  Administered 2021-05-09 – 2021-05-10 (×3): 2 [IU] via SUBCUTANEOUS

## 2021-05-04 NOTE — Procedures (Signed)
Extubation Procedure Note  Patient Details:   Name: Courtney Braun DOB: [birth year] MRN: [id number]   Airway Documentation:    Vent end date: 05/04/21 Vent end time: 0903   Evaluation  O2 sats: stable throughout Complications: No apparent complications Patient did tolerate procedure well. Bilateral Breath Sounds: Rhonchi   Pt extubated to 4L [place] per MD order. Pt had positive cuff leak prior to extubation. No stridor was noted. Pt able to voice her name.  Vilinda Blanks 05/04/2021, 9:03 AM

## 2021-05-04 NOTE — Evaluation (Signed)
Clinical/Bedside Swallow Evaluation Patient Details  Name: Courtney Braun MRN: [id number] Date of Birth: [birth year]  Today's Date: 05/04/2021 Time: SLP Start Time (ACUTE ONLY): 1655 SLP Stop Time (ACUTE ONLY): 1715 SLP Time Calculation (min) (ACUTE ONLY): 20 min  Past Medical History:  Past Medical History:  Diagnosis Date   Anemia    Diabetes mellitus without complication ([place])    type II   History of blood transfusion    Hyperlipidemia    Hypertension    Past Surgical History:  Past Surgical History:  Procedure Laterality Date   ESOPHAGOGASTRODUODENOSCOPY N/A 08/06/2016   Procedure: ESOPHAGOGASTRODUODENOSCOPY (EGD);  Surgeon: Clarene Essex, MD;  Location: [facility];  Service: Endoscopy;  Laterality: N/A;   ESOPHAGOGASTRODUODENOSCOPY (EGD) WITH PROPOFOL N/A 05/09/2018   Procedure: ESOPHAGOGASTRODUODENOSCOPY (EGD) WITH PROPOFOL;  Surgeon: Clarene Essex, MD;  Location: [facility];  Service: Endoscopy;  Laterality: N/A;   ESOPHAGOGASTRODUODENOSCOPY (EGD) WITH PROPOFOL N/A 11/04/2018   Procedure: ESOPHAGOGASTRODUODENOSCOPY (EGD) WITH PROPOFOL;  Surgeon: Clarene Essex, MD;  Location: [facility];  Service: Endoscopy;  Laterality: N/A;   GASTRIC VARICES BANDING N/A 05/09/2018   Procedure: GASTRIC VARICES BANDING;  Surgeon: Clarene Essex, MD;  Location: [facility];  Service: Endoscopy;  Laterality: N/A;   IR THORACENTESIS ASP PLEURAL SPACE W/IMG GUIDE  04/28/2021   none     TONSILLECTOMY     HPI:  Patient is a 71 y.o. female with PMH: nonalcoholic cirrhosis secondary to NASH, esophageal varices bleeding 2018, HTN, IIDM, HLD, came with increasing SOB and increasing abdomen size. She went to her hepatologist at [facility] who increased her Lasix from 40 to 47m but her symptoms continued. She lost her son last year and has developed bilateral arm and finger tremors which she associates with the stress of her son's death. In ED, patient had tachycardia and HTN but no hypoxia; was found to be  fluid overloaded and CT chest and abdomen showing large right sided pleural effusion as well as large ascites. Respiratory status worsened despite thoracentesis and patient required 15L HFNC. She was intubated on 2/11 and extubated on 2/16 at 9am to nasal cannula. She failed [facility] swallow screen with nursing prompting order for speech swallow evaluation.    Assessment / Plan / Recommendation  Clinical Impression  Patient presents with clinical s/s of dysphagia as per this bedside/clinical swallow evaluation. Patient appeared to be sleeping when SLP arrived but she awakened easily. Her voice was very low in intensity and hoarse but after consuming some water, voice intensity and quality improved. Patient tolerated multiple successive straw sips of thin liquids (water) and ginger ale with only occasional delayed congested cough. Swallow initiation appeared timely and no change in patient's vitals (SpO2 remaining in high-90%s and RR in range of 18-21). Patient consumed entire cup of applesauce without any observed difficulty. No other solids were attempted as patient did appear fatigued overall. SLP is recommending to initiate PO diet of full liquids (thin consistency) and plan to follow up next date to ensure toleration and trial of upgraded solids if able. SLP Visit Diagnosis: Dysphagia, unspecified (R13.10)    Aspiration Risk  Mild aspiration risk;Moderate aspiration risk    Diet Recommendation Thin liquid;Other (Comment) (full liquids)   Liquid Administration via: Cup;Straw Medication Administration: Whole meds with puree Supervision: Full supervision/cueing for compensatory strategies;Staff to assist with self feeding Compensations: Slow rate;Small sips/bites Postural Changes: Seated upright at 90 degrees    Other  Recommendations Oral Care Recommendations: Oral care BID;Staff/trained caregiver to provide oral care  Recommendations for follow up therapy are one component of a  multi-disciplinary discharge planning process, led by the attending physician.  Recommendations may be updated based on patient status, additional functional criteria and insurance authorization.  Follow up Recommendations Other (comment) (TBD)      Assistance Recommended at Discharge Intermittent Supervision/Assistance  Functional Status Assessment Patient has had a recent decline in their functional status and demonstrates the ability to make significant improvements in function in a reasonable and predictable amount of time.  Frequency and Duration min 2x/week  1 week       Prognosis Prognosis for Safe Diet Advancement: Good      Swallow Study   General Date of Onset: 04/27/21 HPI: Patient is a 71 y.o. female with PMH: nonalcoholic cirrhosis secondary to NASH, esophageal varices bleeding 2018, HTN, IIDM, HLD, came with increasing SOB and increasing abdomen size. She went to her hepatologist at [facility] who increased her Lasix from 40 to 33m but her symptoms continued. She lost her son last year and has developed bilateral arm and finger tremors which she associates with the stress of her son's death. In ED, patient had tachycardia and HTN but no hypoxia; was found to be fluid overloaded and CT chest and abdomen showing large right sided pleural effusion as well as large ascites. Respiratory status worsened despite thoracentesis and patient required 15L HFNC. She was intubated on 2/11 and extubated on 2/16 at 9am to nasal cannula. She failed [facility] swallow screen with nursing prompting order for speech swallow evaluation. Type of Study: Bedside Swallow Evaluation Previous Swallow Assessment: none found Diet Prior to this Study: NPO Temperature Spikes Noted: No Respiratory Status: Nasal cannula History of Recent Intubation: Yes Length of Intubations (days): 8 days Date extubated: 05/04/21 Behavior/Cognition: Alert;Cooperative;Pleasant mood Oral Cavity Assessment: Within Functional  Limits Oral Care Completed by SLP: Yes Oral Cavity - Dentition: Adequate natural dentition Self-Feeding Abilities: Total assist Patient Positioning: Upright in bed Baseline Vocal Quality: Hoarse;Low vocal intensity Volitional Cough: Strong Volitional Swallow: Able to elicit    Oral/Motor/Sensory Function Overall Oral Motor/Sensory Function: Within functional limits   Ice Chips     Thin Liquid Thin Liquid: Impaired Presentation: Straw Pharyngeal  Phase Impairments: Suspected delayed Swallow;Cough - Delayed Other Comments: delayed cough response did not occur consistently and was followed by patient attempting to cough up audible secretions. Voice remained clear throughout and no change in vitals    Nectar Thick     Honey Thick     Puree Puree: Within functional limits Presentation: Spoon   Solid     Solid: Not tested      JSonia Baller MA, CCC-SLP Speech Therapy

## 2021-05-04 NOTE — Progress Notes (Signed)
ANTICOAGULATION CONSULT NOTE - follow-up  Pharmacy Consult for Heparin Indication: Portal Vein Thrombosis  Allergies  Allergen Reactions   Metformin And Related     Upset stomach   Voltaren [Diclofenac]     diarrhea    Patient Measurements: Height: 5' 5.5" (166.4 cm) Weight: 110.4 kg (243 lb 6.2 oz) IBW/kg (Calculated) : 58.15 Heparin Dosing Weight: 84.3 kg  Vital Signs: Temp: 98.5 F (36.9 C) (02/16 1948) Temp Source: Axillary (02/16 1948) BP: 116/46 (02/16 1800) Pulse Rate: 84 (02/16 1800)  Labs: Recent Labs    05/02/21 0145 05/02/21 0147 05/02/21 0147 05/03/21 0656 05/04/21 0307 05/04/21 1101 05/04/21 1945  HGB  --  8.2*   < > 10.0*  --  9.9*  --   HCT  --  24.0*  --  31.8*  --  31.0*  --   PLT  --   --   --  101*  --  63*  --   LABPROT  --   --   --  21.0*  --   --   --   INR  --   --   --  1.8*  --   --   --   HEPARINUNFRC  --   --    < > 0.38 <0.10* 0.16* 0.31  CREATININE 0.97  --   --  0.86  --  0.89  --    < > = values in this interval not displayed.     Estimated Creatinine Clearance: 70.3 mL/min (by C-G formula based on SCr of 0.89 mg/dL).   Medical History: Past Medical History:  Diagnosis Date   Anemia    Diabetes mellitus without complication ([place])    type II   History of blood transfusion    Hyperlipidemia    Hypertension     Assessment: Courtney Braun is a 71 year old female with a history of NASH cirrhosis complicated by varices and hepatic hydrothorax. She has a portal vein thrombosis and pharmacy is consulted to dose heparin. We will aim for the lower end goal with non-bleeding varices.  Heparin level 0.31 at 19:45  Goal of Therapy:  Heparin level 0.3-0.5 Monitor platelets by anticoagulation protocol: Yes   Plan:  Continue heparin infusion at 1500 units/hr Monitor CBC and Daily HL Monitor for s/sx of bleeding   Vaughan Basta BS, PharmD, BCPS Clinical Pharmacist 05/04/2021 20:22

## 2021-05-04 NOTE — Progress Notes (Signed)
ANTICOAGULATION CONSULT NOTE - Follow Up Consult  Pharmacy Consult for heparin Indication:  portal vein thrombosis  Labs: Recent Labs    05/01/21 0549 05/02/21 0145 05/02/21 0147 05/03/21 0656 05/04/21 0307  HGB  --   --  8.2* 10.0*  --   HCT  --   --  24.0* 31.8*  --   PLT  --   --   --  101*  --   LABPROT  --   --   --  21.0*  --   INR  --   --   --  1.8*  --   HEPARINUNFRC  --   --   --  0.38 <0.10*  CREATININE 1.00 0.97  --  0.86  --     Assessment: 71yo female subtherapeutic on heparin after one level at goal, RN reports that yesterday's labs were drawn from an IV because phlebotomy couldn't get peripheral stick; no infusion issues or signs of bleeding per RN.  Goal of Therapy:  Heparin level 0.3-0.5 units/ml   Plan:  Will increase heparin infusion by 3 units/kgABW/hr to 1250 units/hr and check level in 6 hours.    Wynona Neat, PharmD, BCPS  05/04/2021,4:04 AM

## 2021-05-04 NOTE — Progress Notes (Signed)
[facility]enterology Progress Note  Courtney Braun 71 y.o. 1950-11-10  CC: Decompensated cirrhosis   Subjective: Patient seen and examined at bedside.  Was still intubated this morning at the time of my visit.  ROS : Not able to obtain.   Objective: Vital signs in last 24 hours: Vitals:   05/04/21 0800 05/04/21 0902  BP: 129/62 (!) 144/60  Pulse: 78 (!) 101  Resp: 20 (!) 21  Temp:    SpO2: 98% 95%    Physical Exam:  General : Was intubated at time of my examination. Abdomen : Moderately distended, bowel sounds present, no peritoneal signs, nontender.   Lab Results: Recent Labs    05/02/21 0145 05/02/21 0147 05/03/21 0656  NA 143 143 145  K 4.2 4.0 4.1  CL 107  --  108  CO2 25  --  31  GLUCOSE 143*  --  127*  BUN 49*  --  48*  CREATININE 0.97  --  0.86  CALCIUM 9.3  --  8.7*   Recent Labs    05/03/21 0656  AST 35  ALT 25  ALKPHOS 63  BILITOT 2.2*  PROT 6.5  ALBUMIN 2.8*   Recent Labs    05/02/21 0147 05/03/21 0656  WBC  --  9.4  HGB 8.2* 10.0*  HCT 24.0* 31.8*  MCV  --  97.2  PLT  --  101*   Recent Labs    05/03/21 0656  LABPROT 21.0*  INR 1.8*      Assessment/Plan: 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 MELD 18 as of 02/12 -Gastric varices with splenorenal shunt.  Portal vein thrombosis.   -Respiratory failure.  Extubated now   Recommendation ----------------------- -Continue lactulose to 10 g twice daily -Continue octreotide for total 72 hours.  Can consider DC octreotide tomorrow -Not a candidate for beta-blocker because of refractory ascites/hepatic hydrothorax -Started on heparin drip for portal vein thrombosis.  Based on CT report looks like portal vein thrombosis is chronic . hemoglobin stable.  No overt bleeding.    - Per Dr. Serafina Royals from IR - technically she can be a candidate for attempt at TIPS with main portal vein venoplasty and possible stenting to give her a chance at decreased  portal hypertension. I would only consider this as a long term approach to managing recurrent hepatic hydrothorax, or acutely if she has hemorrhage from her large gastric varices.    Otis Brace MD, [place] 05/04/2021, 9:14 AM  Contact #  [phone]

## 2021-05-04 NOTE — Progress Notes (Signed)
NAME:  Courtney Braun, MRN:  [id number], DOB:  10/07/1950, LOS: 75 ADMISSION DATE:  04/27/2021, CONSULTATION DATE: 04/29/2021 REFERRING MD: Dr. Pietro Cassis, CHIEF COMPLAINT: Acute respiratory failure  History of Present Illness:  71 year old woman with a history of Courtney Braun cirrhosis complicated by esophageal varices, chronic anemia and right hepatic hydrothorax.  Also with a history of diabetes, hypertension, hyperlipidemia. Admitted 2/9 with progressive abdominal distention and associated dyspnea.  She has had an overall decline in her health over about 1 year.  Diuretic dosing has been adjusted as an outpatient but symptoms continue to progress.  Admitted for further care.  She underwent a right thoracentesis on 2/10.  Per RN she was doing well on 4 L/min morning of 2/11, then developed a sudden decline in respiratory status, more dyspnea, more hypoxemia and increased cough with yellow secretions.  Now on 15 L/min.  Chest x-ray reviewed, shows increasing bilateral alveolar infiltrates, particularly on the right.  The right pleural effusion is smaller than at presentation, has not increased significantly since thoracentesis.  Lasix given, empiric ceftriaxone started.  PCCM consulted to eval. She will move to ICU for further care  Pertinent  Medical History   Past Medical History:  Diagnosis Date   Anemia    Diabetes mellitus without complication ([place])    type II   History of blood transfusion    Hyperlipidemia    Hypertension     Significant Hospital Events: Including procedures, antibiotic start and stop dates in addition to other pertinent events   CT chest 2/9 >> large right pleural effusion with compressive atelectasis of the right lower and right middle lobes.  Marked hepatic cirrhosis with severe portal hypertension and evidence for large perigastric and gastric varices.  Scattered patchy left upper lobe groundglass infiltrates Chest x-ray 2/11 >> progression of bilateral alveolar infiltrates,  no real increase in size of pleural effusions since thoracentesis Pleural fluid 2/10 >> transudate.  WBC 97 (22 N, 57L, 21 M, 0E).  Gram stain negative TTE 2/11 >> CT Angio A/P for TIPS eval on 2/12 shows chronic portal venous thrombosis, hepatic hydrothorax, gastrorenal shunting, bilateral patchy airspace opacities consistent with pneumonia. Extubated 2/16  Interim History / Subjective:  Tolerated ventilation without sedation through the night. Alert with brisk responses to commands, strong cough on endotracheal suctioning, extubated this morning  Objective   Blood pressure (!) 144/60, pulse (!) 101, temperature 99.5 F (37.5 C), temperature source Axillary, resp. rate (!) 21, height 5' 5.5" (1.664 m), weight 110.4 kg, SpO2 95 %.    Vent Mode: CPAP;PSV FiO2 (%):  [40 %] 40 % PEEP:  [5 cmH20] 5 cmH20 Pressure Support:  [5 cmH20-15 cmH20] 15 cmH20 Plateau Pressure:  [0 cmH20] 0 cmH20   Intake/Output Summary (Last 24 hours) at 05/04/2021 0911 Last data filed at 05/04/2021 0801 Gross per 24 hour  Intake 2430.16 ml  Output 3675 ml  Net -1244.84 ml   Filed Weights   05/02/21 0500 05/03/21 0500 05/04/21 0500  Weight: 111.5 kg 109.1 kg 110.4 kg    Examination: Gen:      Intubated, sedated, acutely ill appearing HEENT:  ETT to vent Lungs:    sounds of mechanical ventilation auscultated breath sounds diminished,  bilat continuous wheezing CV:         RRR, systolic murmur Abd:      Soft, nontender, active bowel sounds Ext:    No edema, extremities thin Skin:      Warm and dry; no rashes Neuro:  responsive to stimuli and commands  Resolved Hospital Problem list     Assessment & Plan:  Acute hypoxemic respiratory failure Hepatic Hydrothorax Diffuse bilateral pulmonary infiltrates on CT concerning for HCAP.  - Remains afebrile, improved breath sounds, extubated today (2/16) - humidified high-flow nasal cannulae - ceftriaxone day 5 of 7 - ddimer elevatd in the setting of portal  venous thrombosis - continue diuresis with lasix, aldactone - continue prn bronchodilators - discontinue vent related sedatives - monitor mentation and evaluate patient competency  - evaluate swallow function, if suboptimal consider cortrak - void trial to take out foley - Have discussion on plan of care  Septic Shock - on low dose norepinephrine - abx - titrate down as tolerated for goal MAP>65  Decompensated [place] cirrhosis.   With portal gastropathy, esophageal and gastric varices and hepatic hydrothorax. IR consulted and determined patient is not a candidate for TIPS except in case of life-threatening gastric variceal bleeding. Continue with medical management. - MELD-Na Score 18 points - GI following, IR consulted for TIPS evaluation. Continue to appreciate recommendations - continue diuresis with lasix and aldactone as ordered - stop octreotide tomorrow  Coagulopathy of liver disease - No apparent bleed - Heparin pending (goal 0.3-0.5) - follow up on labs - consider FFP if she needs procedures  History of hepatic encephalopathy - On lactulose at home, continue  Anemia of Chronic disease -following CBC -transfuse for Hb<7 or hemodynamically significant bleeding  Diabetes with hyperglycemia - SSI PRN - goal BG 140-180 if needing insulin   Best Practice (right click and "Reselect all SmartList Selections" daily)   Diet/type: NPO w/ meds via tube DVT prophylaxis: SCD GI prophylaxis: PPI Lines: N/A Foley:  N/A Code Status:  full code Last date of multidisciplinary goals of care discussion [discussed with patient 2/11. Niece updated at bedside 04/30/21]  The patient is critically ill due to septic shock, respiratory failure.  Critical care was necessary to treat or prevent imminent or life-threatening deterioration.  Critical care was time spent personally by me on the following activities: development of treatment plan with patient and/or surrogate as well as nursing,  discussions with consultants, evaluation of patient's response to treatment, examination of patient, obtaining history from patient or surrogate, ordering and performing treatments and interventions, ordering and review of laboratory studies, ordering and review of radiographic studies, pulse oximetry, re-evaluation of patient's condition and participation in multidisciplinary rounds.   Critical Care Time devoted to patient care services described in this note is 40 minutes. This time reflects time of care of this Courtney Braun . This critical care time does not reflect separately billable procedures or procedure time, teaching time or supervisory time of PA/NP/Med student/Med Resident etc but could involve care discussion time.       Courtney Braun A  Courtney Braun    Labs   CBC: Recent Labs  Lab 04/27/21 1229 04/27/21 1255 04/29/21 0222 04/29/21 1655 04/30/21 0355 04/30/21 0718 05/02/21 0147 05/03/21 0656  WBC 12.0*  --  16.9*  --   --  17.9*  --  9.4  NEUTROABS 9.9*  --  14.0*  --   --   --   --   --   HGB 11.6*   < > 9.8* 9.5* 8.8* 10.0* 8.2* 10.0*  HCT 34.5*   < > 30.9* 28.0* 26.0* 31.3* 24.0* 31.8*  MCV 93.5  --  96.6  --   --  97.8  --  97.2  PLT PLATELET CLUMPS NOTED ON  SMEAR, UNABLE TO ESTIMATE  --  114*  --   --  PLATELET CLUMPS NOTED ON SMEAR, UNABLE TO ESTIMATE  --  101*   < > = values in this interval not displayed.    Basic Metabolic Panel: Recent Labs  Lab 04/29/21 0222 04/29/21 1628 04/29/21 1655 04/30/21 0718 04/30/21 1811 05/01/21 0549 05/02/21 0145 05/02/21 0147 05/03/21 0656  NA 139  --    < > 140  --  140 143 143 145  K 4.1  --    < > 4.2  --  4.3 4.2 4.0 4.1  CL 104  --   --  106  --  105 107  --  108  CO2 26  --   --  26  --  28 25  --  31  GLUCOSE 116*  --   --  138*  --  167* 143*  --  127*  BUN 26*  --   --  38*  --  48* 49*  --  48*  CREATININE 0.81  --   --  1.09*  --  1.00 0.97  --  0.86  CALCIUM 8.8*  --   --  8.8*  --  8.8* 9.3  --  8.7*   MG  --  1.8  --  2.0 2.1 2.1  --   --   --   PHOS  --  4.1  --  3.9 4.8* 4.2  --   --   --    < > = values in this interval not displayed.   GFR: Estimated Creatinine Clearance: 72.8 mL/min (by C-G formula based on SCr of 0.86 mg/dL). Recent Labs  Lab 04/27/21 1229 04/29/21 0222 04/29/21 1527 04/30/21 0718 05/01/21 0549 05/03/21 0656  PROCALCITON  --  0.24  --  0.47 0.46  --   WBC 12.0* 16.9*  --  17.9*  --  9.4  LATICACIDVEN  --   --  1.6 1.2  --   --     Liver Function Tests: Recent Labs  Lab 04/27/21 1229 04/30/21 0718 05/03/21 0656  AST 63* 39 35  ALT 42 23 25  ALKPHOS 99 62 63  BILITOT 2.4* 2.3* 2.2*  PROT 6.4* 5.9* 6.5  ALBUMIN 2.2* 2.9* 2.8*   No results for input(s): LIPASE, AMYLASE in the last 168 hours. Recent Labs  Lab 04/27/21 1815  AMMONIA 30    ABG    Component Value Date/Time   PHART 7.437 05/02/2021 0147   PCO2ART 48.4 (H) 05/02/2021 0147   PO2ART 129 (H) 05/02/2021 0147   HCO3 33.0 (H) 05/02/2021 0147   TCO2 34 (H) 05/02/2021 0147   O2SAT 99.0 05/02/2021 0147      Coagulation Profile: Recent Labs  Lab 04/27/21 1637 04/28/21 0329 04/29/21 1527 04/30/21 0718 05/03/21 0656  INR 1.6* 1.7* 2.1* 2.0* 1.8*    Cardiac Enzymes: No results for input(s): CKTOTAL, CKMB, CKMBINDEX, TROPONINI in the last 168 hours.  HbA1C: Hgb A1c MFr Bld  Date/Time Value Ref Range Status  04/28/2021 03:29 AM 4.3 (L) 4.8 - 5.6 % Final    Comment:    (NOTE) Pre diabetes:          5.7%-6.4%  Diabetes:              >6.4%  Glycemic control for   <7.0% adults with diabetes     CBG: Recent Labs  Lab 05/03/21 1530 05/03/21 1946 05/03/21 2334 05/04/21 0344 05/04/21 0749  GLUCAP 132* 157*  164* 160* 173*  ° ° ° °

## 2021-05-04 NOTE — Progress Notes (Signed)
[facility] Progress Note Patient Name: Courtney Braun DOB: Sep 20, 1950 MRN: [id number]   Date of Service  05/04/2021  HPI/Events of Note  May pt sliding scale be changed to ACHS since she is extubated and on full liquid diet  eICU Interventions  Changed to ACHS.     Intervention Category Minor Interventions: Other: (SSI change)  Elmer Sow 05/04/2021, 8:51 PM

## 2021-05-04 NOTE — Progress Notes (Signed)
ANTICOAGULATION CONSULT NOTE - Initial Consult  Pharmacy Consult for Heparin Indication: Portal Vein Thrombosis  Allergies  Allergen Reactions   Metformin And Related     Upset stomach   Voltaren [Diclofenac]     diarrhea    Patient Measurements: Height: 5' 5.5" (166.4 cm) Weight: 110.4 kg (243 lb 6.2 oz) IBW/kg (Calculated) : 58.15 Heparin Dosing Weight: 84.3 kg  Vital Signs: Temp: 99 F (37.2 C) (02/16 1100) Temp Source: Axillary (02/16 1100) BP: 144/60 (02/16 0902) Pulse Rate: 101 (02/16 0902)  Labs: Recent Labs    05/02/21 0145 05/02/21 0147 05/02/21 0147 05/03/21 0656 05/04/21 0307 05/04/21 1101  HGB  --  8.2*   < > 10.0*  --  9.9*  HCT  --  24.0*  --  31.8*  --  31.0*  PLT  --   --   --  101*  --  63*  LABPROT  --   --   --  21.0*  --   --   INR  --   --   --  1.8*  --   --   HEPARINUNFRC  --   --   --  0.38 <0.10* 0.16*  CREATININE 0.97  --   --  0.86  --  0.89   < > = values in this interval not displayed.     Estimated Creatinine Clearance: 70.3 mL/min (by C-G formula based on SCr of 0.89 mg/dL).   Medical History: Past Medical History:  Diagnosis Date   Anemia    Diabetes mellitus without complication ([place])    type II   History of blood transfusion    Hyperlipidemia    Hypertension     Assessment: Courtney Braun is a 71 year old female with a history of NASH cirrhosis complicated by varices and hepatic hydrothorax. She has a portal vein thrombosis and pharmacy is consulted to dose heparin. We will aim for the lower end goal with non-bleeding varices.  Heparin level returned low at goal at 0.26. No issues during infusion and no signs or symptoms of bleeding per RN.  Hgb 9.9, Plt 63  Goal of Therapy:  Heparin level 0.3-0.5 Monitor platelets by anticoagulation protocol: Yes   Plan:  Increase heparin infusion to 1500 units/h Monitor CBC and Daily HL Heparin level in 6 hours Monitor for s/sx of bleeding   Lestine Box,  PharmD PGY2 Infectious Diseases Pharmacy Resident   Please check [email or web address] for unit-specific pharmacy phone numbers

## 2021-05-05 DIAGNOSIS — I864 Gastric varices: Secondary | ICD-10-CM | POA: Diagnosis not present

## 2021-05-05 DIAGNOSIS — D696 Thrombocytopenia, unspecified: Secondary | ICD-10-CM

## 2021-05-05 DIAGNOSIS — K7460 Unspecified cirrhosis of liver: Secondary | ICD-10-CM | POA: Diagnosis not present

## 2021-05-05 DIAGNOSIS — J181 Lobar pneumonia, unspecified organism: Secondary | ICD-10-CM | POA: Diagnosis not present

## 2021-05-05 DIAGNOSIS — J9601 Acute respiratory failure with hypoxia: Secondary | ICD-10-CM | POA: Diagnosis not present

## 2021-05-05 LAB — COMPREHENSIVE METABOLIC PANEL
ALT: 20 U/L (ref 0–44)
AST: 66 U/L — ABNORMAL HIGH (ref 15–41)
Albumin: 2.3 g/dL — ABNORMAL LOW (ref 3.5–5.0)
Alkaline Phosphatase: 56 U/L (ref 38–126)
Anion gap: 6 (ref 5–15)
BUN: 36 mg/dL — ABNORMAL HIGH (ref 8–23)
CO2: 33 mmol/L — ABNORMAL HIGH (ref 22–32)
Calcium: 8.3 mg/dL — ABNORMAL LOW (ref 8.9–10.3)
Chloride: 105 mmol/L (ref 98–111)
Creatinine, Ser: 0.8 mg/dL (ref 0.44–1.00)
GFR, Estimated: >60 mL/min (ref >60)
Glucose, Bld: 129 mg/dL — ABNORMAL HIGH (ref 70–99)
Potassium: 5 mmol/L (ref 3.5–5.1)
Sodium: 144 mmol/L (ref 135–145)
Total Bilirubin: 1.5 mg/dL — ABNORMAL HIGH (ref 0.3–1.2)
Total Protein: 5.6 g/dL — ABNORMAL LOW (ref 6.5–8.1)

## 2021-05-05 LAB — CBC
HCT: 29.9 % — ABNORMAL LOW (ref 36.0–46.0)
Hemoglobin: 9.5 g/dL — ABNORMAL LOW (ref 12.0–15.0)
MCH: 31.6 pg (ref 26.0–34.0)
MCHC: 31.8 g/dL (ref 30.0–36.0)
MCV: 99.3 fL (ref 80.0–100.0)
Platelets: 55 10*3/uL — ABNORMAL LOW (ref 150–400)
RBC: 3.01 MIL/uL — ABNORMAL LOW (ref 3.87–5.11)
RDW: 16.3 % — ABNORMAL HIGH (ref 11.5–15.5)
WBC: 6 10*3/uL (ref 4.0–10.5)
nRBC: 0 % (ref 0.0–0.2)

## 2021-05-05 LAB — GLUCOSE, CAPILLARY
Glucose-Capillary: 115 mg/dL — ABNORMAL HIGH (ref 70–99)
Glucose-Capillary: 119 mg/dL — ABNORMAL HIGH (ref 70–99)
Glucose-Capillary: 137 mg/dL — ABNORMAL HIGH (ref 70–99)
Glucose-Capillary: 223 mg/dL — ABNORMAL HIGH (ref 70–99)
Glucose-Capillary: 151 mg/dL — ABNORMAL HIGH (ref 70–99)

## 2021-05-05 LAB — HEPARIN LEVEL (UNFRACTIONATED): Heparin Unfractionated: 0.4 IU/mL (ref 0.30–0.70)

## 2021-05-05 MED ORDER — ENSURE ENLIVE PO LIQD
237.0000 mL | Freq: Two times a day (BID) | ORAL | Status: DC
  Administered 2021-05-05 – 2021-05-10 (×9): 237 mL via ORAL

## 2021-05-05 MED ORDER — SODIUM CHLORIDE 0.9% FLUSH
3.0000 mL | Freq: Two times a day (BID) | INTRAVENOUS | Status: DC
  Administered 2021-05-05 – 2021-05-10 (×10): 3 mL via INTRAVENOUS

## 2021-05-05 MED ORDER — SODIUM CHLORIDE 0.9 % IV SOLN: 250.0000 mL | INTRAVENOUS | Status: DC | PRN

## 2021-05-05 MED ORDER — SODIUM CHLORIDE 0.9% FLUSH: 3.0000 mL | INTRAVENOUS | Status: DC | PRN

## 2021-05-05 MED ORDER — APIXABAN 5 MG PO TABS: 5.0000 mg | ORAL_TABLET | Freq: Two times a day (BID) | ORAL | Status: DC

## 2021-05-05 MED ORDER — APIXABAN 5 MG PO TABS
10.0000 mg | ORAL_TABLET | Freq: Two times a day (BID) | ORAL | Status: DC
  Administered 2021-05-05 – 2021-05-08 (×7): 10 mg via ORAL
  Filled 2021-05-05 (×7): qty 2

## 2021-05-05 MED ORDER — LACTULOSE 10 GM/15ML PO SOLN: 10.0000 g | Freq: Every day | ORAL | Status: DC | PRN

## 2021-05-05 MED ORDER — BETHANECHOL CHLORIDE 10 MG PO TABS
5.0000 mg | ORAL_TABLET | Freq: Three times a day (TID) | ORAL | Status: DC
  Administered 2021-05-05 (×2): 5 mg via ORAL
  Filled 2021-05-05 (×2): qty 1

## 2021-05-05 NOTE — Progress Notes (Signed)
Speech Language Pathology Treatment: Dysphagia  Patient Details Name: Courtney Braun MRN: [id number] DOB: [birth year] Today's Date: 05/05/2021 Time: 9292-4462 SLP Time Calculation (min) (ACUTE ONLY): 9 min  Assessment / Plan / Recommendation Clinical Impression  Pt is progressing nicely towards goals and is able to upgrade po texture to regular today after demonstrating functional mastication and transit with solids. She fed herself graham cracker after therapist opened package and held her water independently. Transit time and swallow appeared normal from observation/subjective information and pt stated she had no difficulty with breakfast. Regular texture/thin recommended with assist for setup and feeding if needed. ST will follow briefly.    HPI HPI: Patient is a 71 y.o. female with PMH: nonalcoholic cirrhosis secondary to NASH, esophageal varices bleeding 2018, HTN, IIDM, HLD, came with increasing SOB and increasing abdomen size. She went to her hepatologist at [facility] who increased her Lasix from 40 to 4m but her symptoms continued. She lost her son last year and has developed bilateral arm and finger tremors which she associates with the stress of her son's death. In ED, patient had tachycardia and HTN but no hypoxia; was found to be fluid overloaded and CT chest and abdomen showing large right sided pleural effusion as well as large ascites. Respiratory status worsened despite thoracentesis and patient required 15L HFNC. She was intubated on 2/11 and extubated on 2/16 at 9am to nasal cannula. She failed [facility] swallow screen with nursing prompting order for speech swallow evaluation.      SLP Plan  Continue with current plan of care      Recommendations for follow up therapy are one component of a multi-disciplinary discharge planning process, led by the attending physician.  Recommendations may be updated based on patient status, additional functional criteria and insurance  authorization.    Recommendations  Diet recommendations: Regular;Thin liquid Liquids provided via: Cup;Straw Medication Administration: Whole meds with puree Supervision: Staff to assist with self feeding;Full supervision/cueing for compensatory strategies Compensations: Slow rate;Small sips/bites Postural Changes and/or Swallow Maneuvers: Seated upright 90 degrees                Oral Care Recommendations: Oral care BID Follow Up Recommendations:  (TBD) SLP Visit Diagnosis: Dysphagia, unspecified (R13.10) Plan: Continue with current plan of care           LHouston Siren 05/05/2021, 12:42 PM  LOrbie PyoLColvin CaroliEd CRisk [phone] [phone]

## 2021-05-05 NOTE — Progress Notes (Signed)
ANTICOAGULATION CONSULT NOTE - follow-up  Pharmacy Consult for Heparin Indication: Portal Vein Thrombosis  Allergies  Allergen Reactions   Metformin And Related     Upset stomach   Voltaren [Diclofenac]     diarrhea    Patient Measurements: Height: 5' 5.5" (166.4 cm) Weight: 111.3 kg (245 lb 6 oz) IBW/kg (Calculated) : 58.15 Heparin Dosing Weight: 84.3 kg  Vital Signs: Temp: 97.9 F (36.6 C) (02/17 0339) Temp Source: Axillary (02/17 0339) BP: 121/65 (02/17 0600) Pulse Rate: 66 (02/17 0630)  Labs: Recent Labs    05/03/21 0656 05/04/21 0307 05/04/21 1101 05/04/21 1945 05/05/21 0603  HGB 10.0*  --  9.9*  --  9.5*  HCT 31.8*  --  31.0*  --  29.9*  PLT 101*  --  63*  --  55*  LABPROT 21.0*  --   --   --   --   INR 1.8*  --   --   --   --   HEPARINUNFRC 0.38   < > 0.16* 0.31 0.40  CREATININE 0.86  --  0.89  --  0.80   < > = values in this interval not displayed.     Estimated Creatinine Clearance: 78.5 mL/min (by C-G formula based on SCr of 0.8 mg/dL).   Medical History: Past Medical History:  Diagnosis Date   Anemia    Diabetes mellitus without complication ([place])    type II   History of blood transfusion    Hyperlipidemia    Hypertension     Assessment: [name] is a 71 year old female with a history of NASH cirrhosis complicated by varices and hepatic hydrothorax. She has a portal vein thrombosis and pharmacy is consulted to dose heparin. We will aim for the lower end goal with non-bleeding varices.  Heparin level at goal this am at 0.40. No issues during infusion and no signs/symptoms of bleeding. Per RN, level was drawn through peripheral IV after flushing with 10cc.   Goal of Therapy:  Heparin level 0.3-0.5 Monitor platelets by anticoagulation protocol: Yes   Plan:  Continue heparin infusion at 1500 units/hr Monitor CBC and Daily HL Monitor for s/sx of bleeding   Courtney Braun, PharmD PGY2 Infectious Diseases Pharmacy Resident    Please check [email or web address] for unit-specific pharmacy phone numbers

## 2021-05-05 NOTE — Evaluation (Signed)
Physical Therapy Evaluation Patient Details Name: Courtney Braun MRN: [id number] DOB: Oct 18, 1950 Today's Date: 05/05/2021  History of Present Illness  Courtney Braun is a 71 y.o. female who presented with increased SOB and edema. CT chest/abdomen showed large R sided pleural effusion, ascites, abd liver cirrhosis with portal hypertension. Underwent thoracentesis on 2/10. Due to acute hypoxic event in setting of multilobar PNA, pt required intubation on 2/11 and extubated on 2/16. PMH: NASH cirrhosis, esophageal varices bleeding 2018, HTN, IIDM, HLD  Clinical Impression  PTA pt living alone with her dog in single story home with level entry. Pt reports independence with mobility and iADLs, driving. Pt is currently limited in safe mobility by decreased strength, and balance in presence of tremors making it difficult to use DME. Pt is currently modA for transfers and bed mobility. Given increased physical assist needed for mobility and decreased assistance available. PT recommending SNF level rehab at discharge at this time. Will continue to work with pt acutely toward discharge home.      Recommendations for follow up therapy are one component of a multi-disciplinary discharge planning process, led by the attending physician.  Recommendations may be updated based on patient status, additional functional criteria and insurance authorization.  Follow Up Recommendations Skilled nursing-short term rehab (<3 hours/day)    Assistance Recommended at Discharge Frequent or constant Supervision/Assistance  Patient can return home with the following  A lot of help with walking and/or transfers;A lot of help with bathing/dressing/bathroom;Assistance with cooking/housework;Assistance with feeding;Direct supervision/assist for medications management;Direct supervision/assist for financial management;Assist for transportation    Equipment Recommendations Rolling walker (2 wheels)     Functional Status  Assessment Patient has had a recent decline in their functional status and demonstrates the ability to make significant improvements in function in a reasonable and predictable amount of time.     Precautions / Restrictions Precautions Precautions: Fall;Other (comment) Precaution Comments: monitor O2 (does not wear at baseline); monitor for orthostatics; tremulous Restrictions Weight Bearing Restrictions: No      Mobility  Bed Mobility Overal bed mobility: Needs Assistance Bed Mobility: Sit to Supine       Sit to supine: Mod assist, +2 for physical assistance   General bed mobility comments: with pt sitting EoB provided education to come down on R elbow, while PT assisted with bringing LE into bed, requested pt not lie straight back however pt could not achieve and NT needed to provide extra assist to guide trunk down to bed surface    Transfers Overall transfer level: Needs assistance Equipment used: Rolling walker (2 wheels), 1 person hand held assist Transfers: Sit to/from Stand, Bed to chair/wheelchair/BSC Sit to Stand: Mod assist   Step pivot transfers: Mod assist, +2 physical assistance, +2 safety/equipment       General transfer comment: modA for coming to standing in RW, maximal multimodal cuing for hand placement for power up to RW. and modA for steadying. Attempted to utilize RW for stepping back to bed, however pt with increased difficulty with keeping Rw on floor due to posterior lean and tremors, removed RW and provided HHA back to bed. Pt incontinent of stool with transfer back to bed.       Balance Overall balance assessment: Needs assistance Sitting-balance support: No upper extremity supported, Feet supported Sitting balance-Leahy Scale: Fair     Standing balance support: Bilateral upper extremity supported, During functional activity Standing balance-Leahy Scale: Poor Standing balance comment: increased posterior lean  Pertinent Vitals/Pain Pain Assessment Pain Assessment: No/denies pain    Home Living Family/patient expects to be discharged to:: Private residence Living Arrangements: Alone Available Help at Discharge: Family;Available PRN/intermittently Type of Home: House Home Access: Level entry       Home Layout: One level Home Equipment: Shower seat - built in;Cane - single point;Cane - quad      Prior Function Prior Level of Function : Independent/Modified Independent;Driving             Mobility Comments: no use of AD for mobility ADLs Comments: Independent with ADLs, IADLs, driving, grocery shopping. has small dog she cares for     Hand Dominance   Dominant Hand: Right    Extremity/Trunk Assessment   Upper Extremity Assessment Upper Extremity Assessment: Defer to OT evaluation RUE Deficits / Details: tremulous; pt reports this occurs at baseline and typically improves by the end of the day; mild edema. RUE Coordination: decreased fine motor LUE Deficits / Details: tremulous; pt reports this occurs at baseline and typically improves by the end of the day; mild edema. LUE Coordination: decreased fine motor    Lower Extremity Assessment Lower Extremity Assessment: RLE deficits/detail;LLE deficits/detail RLE Deficits / Details: tremulous especially with mobility RLE Coordination: decreased fine motor LLE Deficits / Details: tremulous especially with mobility LLE Coordination: decreased fine motor    Cervical / Trunk Assessment Cervical / Trunk Assessment: Normal  Communication   Communication: No difficulties  Cognition Arousal/Alertness: Awake/alert Behavior During Therapy: WFL for tasks assessed/performed Overall Cognitive Status: Within Functional Limits for tasks assessed                                 General Comments: slower to respond/problem solve at times but [facility]        General Comments General comments (skin integrity, edema, etc.):  pt with slight dizziness with coming to standing dissipates quickly and pt able to step to bed        Assessment/Plan    PT Assessment Patient needs continued PT services  PT Problem List Decreased strength;Decreased activity tolerance;Decreased balance;Decreased mobility;Decreased coordination;Decreased cognition;Decreased knowledge of use of DME       PT Treatment Interventions DME instruction;Gait training;Functional mobility training;Therapeutic activities;Therapeutic exercise;Balance training;Cognitive remediation;Patient/family education    PT Goals (Current goals can be found in the Care Plan section)  Acute Rehab PT Goals Patient Stated Goal: get home to her dog PT Goal Formulation: With patient Time For Goal Achievement: 05/19/21 Potential to Achieve Goals: Good    Frequency Min 2X/week        AM-PAC PT "6 Clicks" Mobility  Outcome Measure Help needed turning from your back to your side while in a flat bed without using bedrails?: A Little Help needed moving from lying on your back to sitting on the side of a flat bed without using bedrails?: A Lot Help needed moving to and from a bed to a chair (including a wheelchair)?: A Lot Help needed standing up from a chair using your arms (e.g., wheelchair or bedside chair)?: A Lot Help needed to walk in hospital room?: Total Help needed climbing 3-5 steps with a railing? : Total 6 Click Score: 11    End of Session Equipment Utilized During Treatment: Gait belt;Oxygen Activity Tolerance: Patient tolerated treatment well Patient left: in bed;with call bell/phone within reach;with nursing/sitter in room Nurse Communication: Mobility status PT Visit Diagnosis: Unsteadiness on feet (R26.81);Other abnormalities of gait  and mobility (R26.89);Muscle weakness (generalized) (M62.81);Difficulty in walking, not elsewhere classified (R26.2);Other symptoms and signs involving the nervous system (K47.308)    Time: 5694-3700 PT Time  Calculation (min) (ACUTE ONLY): 27 min   Charges:   PT Evaluation $PT Eval Moderate Complexity: 1 Mod PT Treatments $Therapeutic Activity: 8-22 mins        Artina Minella B. Migdalia Dk PT, DPT Acute Rehabilitation Services Pager [phone] Office [phone]   [place] 05/05/2021, 1:30 PM

## 2021-05-05 NOTE — Progress Notes (Signed)
[facility] Gastroenterology Progress Note  Courtney Braun 71 y.o. 02-22-1951  CC:  Decompensated cirrhosis   Subjective: Patient resting comfortably in bedside chair this morning. She is extubated.  States she is feeling much better today.  She has been coughing up some bloody phlegm but denies nausea or vomiting.  Denies abdominal pain.  Having good urine output and stool output (has rectal tube due to frequent stools on lactulose).   ROS : Review of Systems  Constitutional:  Negative for chills and fever.  Gastrointestinal:  Negative for abdominal pain, constipation, heartburn, nausea and vomiting.  Genitourinary:  Negative for dysuria and hematuria.     Objective: Vital signs in last 24 hours: Vitals:   05/05/21 1000 05/05/21 1100  BP: (!) 114/44 (!) 117/46  Pulse: 73 70  Resp: 18 20  Temp:    SpO2: (!) 89% 99%    Physical Exam:  General:  Alert, cooperative, no distress  Head:  Normocephalic, without obvious abnormality, atraumatic  Eyes:  EOM's intact  Lungs:   Clear to auscultation bilaterally, respirations unlabored  Heart:  Regular rate and rhythm, S1, S2 normal  Abdomen:   Soft, non-tender, bowel sounds active all four quadrants,  no masses,   Extremities: No edema       Lab Results: Recent Labs    05/04/21 1101 05/05/21 0603  NA 148* 144  K 4.8 5.0  CL 107 105  CO2 32 33*  GLUCOSE 168* 129*  BUN 43* 36*  CREATININE 0.89 0.80  CALCIUM 8.7* 8.3*   Recent Labs    05/03/21 0656 05/05/21 0603  AST 35 66*  ALT 25 20  ALKPHOS 63 56  BILITOT 2.2* 1.5*  PROT 6.5 5.6*  ALBUMIN 2.8* 2.3*   Recent Labs    05/04/21 1101 05/05/21 0603  WBC 8.2 6.0  HGB 9.9* 9.5*  HCT 31.0* 29.9*  MCV 96.6 99.3  PLT 63* 55*   Recent Labs    05/03/21 0656  LABPROT 21.0*  INR 1.8*      Assessment 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 MELD 18 as of 02/12 -Gastric varices with splenorenal shunt.  Portal vein thrombosis.    -Respiratory failure.  Extubated now  Plan: -Continue lactulose to 10 g twice daily -Continue octreotide for total 72 hours. Can consider DC octreotide today. -Not a candidate for beta-blocker because of refractory ascites/hepatic hydrothorax -Started on heparin drip for portal vein thrombosis.  Based on CT report looks like portal vein thrombosis is chronic . Hemoglobin stable.  No overt bleeding.     Angelique Holm PA-C 05/05/2021, 11:39 AM  Contact #  [phone]

## 2021-05-05 NOTE — Progress Notes (Signed)
Nutrition Follow-up  DOCUMENTATION CODES:   Non-severe (moderate) malnutrition in context of chronic illness  INTERVENTION:   Ensure Enlive po BID, each supplement provides 350 kcal and 20 grams of protein.  Continue MVI with minerals PO daily.  NUTRITION DIAGNOSIS:   Moderate Malnutrition related to chronic illness (NASH cirrhosis) as evidenced by moderate fat depletion, mild muscle depletion, moderate muscle depletion.  Ongoing   GOAL:   Patient will meet greater than or equal to 90% of their needs  Progressing   MONITOR:   PO intake, Supplement acceptance, Labs  REASON FOR ASSESSMENT:   Ventilator, Consult Enteral/tube feeding initiation and management  ASSESSMENT:   71 yo female admitted with acute respiratory failure, LLL PNA. PMH includes NASH cirrhosis, esophageal varices, chronic anemia, R hepatic hydrothorax, DM, HTN, HLD.  Patient receiving nursing care, so RD was unable to speak with patient today. Discussed patient in ICU rounds and with RN today. Extubated 2/16. SLP following; diet was advanced to regular with thin liquids today. Meal intakes: 50-75% [breakfast (FL) & lunch ([place]) today]  Labs reviewed.  CBG: [phone]  Medications reviewed and include bethanechol, Lasix, Novolog, MVI with minerals, Protonix, Aldactone, octreotide.   Weight overall stable since admission.  I/O -378 ml since admission.  Diet Order:   Diet Order             Diet regular Room service appropriate? Yes; Fluid consistency: Thin  Diet effective now                   EDUCATION NEEDS:   Not appropriate for education at this time  Skin:  Skin Assessment: Reviewed RN Assessment (MASD to coccyx)  Last BM:  2/16 type 7  Height:   Ht Readings from Last 1 Encounters:  04/29/21 5' 5.5" (1.664 m)    Weight:   Wt Readings from Last 1 Encounters:  05/05/21 111.3 kg    BMI:  Body mass index is 40.21 kg/m.  Estimated Nutritional Needs:   Kcal:   1800-2000  Protein:  110-120 gm  Fluid:  1.8-2 L    Lucas Mallow RD, LDN, CNSC Please refer to Amion for contact information.

## 2021-05-05 NOTE — Progress Notes (Signed)
NAME:  Courtney Braun, MRN:  [id number], DOB:  01-06-1951, LOS: 54 ADMISSION DATE:  04/27/2021, CONSULTATION DATE: 04/29/2021 REFERRING MD: Dr. Pietro Cassis, CHIEF COMPLAINT: Acute respiratory failure  History of Present Illness:  71 year old woman with a history of [name] cirrhosis complicated by esophageal varices, chronic anemia and right hepatic hydrothorax.  Also with a history of diabetes, hypertension, hyperlipidemia. Admitted 2/9 with progressive abdominal distention and associated dyspnea.  She has had an overall decline in her health over about 1 year.  Diuretic dosing has been adjusted as an outpatient but symptoms continue to progress.  Admitted for further care.  She underwent a right thoracentesis on 2/10.  Per RN she was doing well on 4 L/min morning of 2/11, then developed a sudden decline in respiratory status, more dyspnea, more hypoxemia and increased cough with yellow secretions.  Now on 15 L/min.  Chest x-ray reviewed, shows increasing bilateral alveolar infiltrates, particularly on the right.  The right pleural effusion is smaller than at presentation, has not increased significantly since thoracentesis.  Lasix given, empiric ceftriaxone started.  PCCM consulted to eval. She will move to ICU for further care  Pertinent  Medical History   Past Medical History:  Diagnosis Date   Anemia    Diabetes mellitus without complication ([place])    type II   History of blood transfusion    Hyperlipidemia    Hypertension     Significant Hospital Events: Including procedures, antibiotic start and stop dates in addition to other pertinent events   CT chest 2/9 >> large right pleural effusion with compressive atelectasis of the right lower and right middle lobes.  Marked hepatic cirrhosis with severe portal hypertension and evidence for large perigastric and gastric varices.  Scattered patchy left upper lobe groundglass infiltrates Chest x-ray 2/11 >> progression of bilateral alveolar infiltrates,  no real increase in size of pleural effusions since thoracentesis Pleural fluid 2/10 >> transudate.  WBC 97 (22 N, 57L, 21 M, 0E).  Gram stain negative TTE 2/11 >> CT Angio A/P for TIPS eval on 2/12 shows chronic portal venous thrombosis, hepatic hydrothorax, gastrorenal shunting, bilateral patchy airspace opacities consistent with pneumonia. Extubated 2/16  Interim History / Subjective:  - O2 titrated down from 4L to 2L Nasal canula.  - Successful void trial, foley removed  - Has notable bilateral upper extremity tremor. Patient states that this is not new and tends to improve as the day progresses. States she takes medication home  Objective   Blood pressure 121/65, pulse 66, temperature 97.9 F (36.6 C), temperature source Axillary, resp. rate 16, height 5' 5.5" (1.664 m), weight 111.3 kg, SpO2 95 %.    Vent Mode: CPAP;PSV FiO2 (%):  [40 %] 40 % PEEP:  [5 cmH20] 5 cmH20 Pressure Support:  [15 cmH20] 15 cmH20   Intake/Output Summary (Last 24 hours) at 05/05/2021 0736 Last data filed at 05/05/2021 0600 Gross per 24 hour  Intake 2087.13 ml  Output 2350 ml  Net -262.87 ml    Filed Weights   05/03/21 0500 05/04/21 0500 05/05/21 0500  Weight: 109.1 kg 110.4 kg 111.3 kg    Examination: Gen:      Chronically ill appearing, alert, responsive to commands HEENT:   Lungs:    clear lung sounds CV:         RRR, systolic murmur Abd:      Soft, nontender, active bowel sounds Ext:    No edema, extremities thin Skin:      Warm and dry;  no rashes Neuro:   responsive to stimuli and commands  Resolved Hospital Problem list     Assessment & Plan:  Acute hypoxemic respiratory failure Hepatic Hydrothorax Diffuse bilateral pulmonary infiltrates on CT concerning for HCAP.  - Remains afebrile, improved breath sounds - ddimer elevatd in the setting of portal venous thrombosis - humidified high-flow nasal cannulae 2L - ceftriaxone day 6 of 7 - continue diuresis with lasix, aldactone -  continue prn bronchodilators - monitor mentation and evaluate patient competency  - evaluate swallow function, if suboptimal consider cortrak - Have discussion on plan of care  Septic Shock - on low dose norepinephrine - abx - titrate down as tolerated for goal MAP>65  Decompensated Nash cirrhosis.   With portal gastropathy, esophageal and gastric varices and hepatic hydrothorax. IR consulted and determined patient is not a candidate for TIPS except in case of life-threatening gastric variceal bleeding. Continue with medical management. - MELD-Na Score 18 points - GI following, IR consulted for TIPS evaluation. Continue to appreciate recommendations - continue diuresis with lasix and aldactone as ordered - simvastatin - stop octreotide   Coagulopathy of liver disease - No apparent bleed - Heparin 0.4 (goal 0.3-0.5) - Platelets 55 today. 4Ts score 4. Switching heparin to apixaban - follow up on labs - consider FFP if she needs procedures  History of hepatic encephalopathy - On lactulose at home, continue  Anemia of Chronic disease -following CBC -transfuse for Hb<7 or hemodynamically significant bleeding  Physiologic tremor - continue to monitor.   Diabetes with hyperglycemia - SSI PRN - 119 (goal BG 140-180) if needing insulin - advance feeding as tolerated    Best Practice (right click and "Reselect all SmartList Selections" daily)   Diet/type: full liquids  DVT prophylaxis: SCD GI prophylaxis: PPI Lines: N/A Foley:  N/A Code Status:  full code Last date of multidisciplinary goals of care discussion [discussed with patient 2/11. Niece updated at bedside 04/30/21]  The patient is critically ill due to septic shock, respiratory failure.  Critical care was necessary to treat or prevent imminent or life-threatening deterioration.  Critical care was time spent personally by me on the following activities: development of treatment plan with patient and/or surrogate as  well as nursing, discussions with consultants, evaluation of patient's response to treatment, examination of patient, obtaining history from patient or surrogate, ordering and performing treatments and interventions, ordering and review of laboratory studies, ordering and review of radiographic studies, pulse oximetry, re-evaluation of patient's condition and participation in multidisciplinary rounds.   Critical Care Time devoted to patient care services described in this note is 40 minutes. This time reflects time of care of this [place] . This critical care time does not reflect separately billable procedures or procedure time, teaching time or supervisory time of PA/NP/Med student/Med Resident etc but could involve care discussion time.       Gabriele Zwilling A  Maui Britten    Labs   CBC: Recent Labs  Lab 04/29/21 0222 04/29/21 1655 04/30/21 0355 04/30/21 0718 05/02/21 0147 05/03/21 0656 05/04/21 1101  WBC 16.9*  --   --  17.9*  --  9.4 8.2  NEUTROABS 14.0*  --   --   --   --   --   --   HGB 9.8*   < > 8.8* 10.0* 8.2* 10.0* 9.9*  HCT 30.9*   < > 26.0* 31.3* 24.0* 31.8* 31.0*  MCV 96.6  --   --  97.8  --  97.2 96.6  PLT 114*  --   --  PLATELET CLUMPS NOTED ON SMEAR, UNABLE TO ESTIMATE  --  101* 63*   < > = values in this interval not displayed.     Basic Metabolic Panel: Recent Labs  Lab 04/29/21 1628 04/29/21 1655 04/30/21 0718 04/30/21 1811 05/01/21 0549 05/02/21 0145 05/02/21 0147 05/03/21 0656 05/04/21 1101  NA  --    < > 140  --  140 143 143 145 148*  K  --    < > 4.2  --  4.3 4.2 4.0 4.1 4.8  CL  --   --  106  --  105 107  --  108 107  CO2  --   --  26  --  28 25  --  31 32  GLUCOSE  --   --  138*  --  167* 143*  --  127* 168*  BUN  --   --  38*  --  48* 49*  --  48* 43*  CREATININE  --   --  1.09*  --  1.00 0.97  --  0.86 0.89  CALCIUM  --   --  8.8*  --  8.8* 9.3  --  8.7* 8.7*  MG 1.8  --  2.0 2.1 2.1  --   --   --   --   PHOS 4.1  --  3.9 4.8* 4.2  --    --   --   --    < > = values in this interval not displayed.    GFR: Estimated Creatinine Clearance: 70.6 mL/min (by C-G formula based on SCr of 0.89 mg/dL). Recent Labs  Lab 04/29/21 0222 04/29/21 1527 04/30/21 0718 05/01/21 0549 05/03/21 0656 05/04/21 1101  PROCALCITON 0.24  --  0.47 0.46  --   --   WBC 16.9*  --  17.9*  --  9.4 8.2  LATICACIDVEN  --  1.6 1.2  --   --   --      Liver Function Tests: Recent Labs  Lab 04/30/21 0718 05/03/21 0656  AST 39 35  ALT 23 25  ALKPHOS 62 63  BILITOT 2.3* 2.2*  PROT 5.9* 6.5  ALBUMIN 2.9* 2.8*    No results for input(s): LIPASE, AMYLASE in the last 168 hours. No results for input(s): AMMONIA in the last 168 hours.   ABG    Component Value Date/Time   PHART 7.437 05/02/2021 0147   PCO2ART 48.4 (H) 05/02/2021 0147   PO2ART 129 (H) 05/02/2021 0147   HCO3 33.0 (H) 05/02/2021 0147   TCO2 34 (H) 05/02/2021 0147   O2SAT 99.0 05/02/2021 0147      Coagulation Profile: Recent Labs  Lab 04/29/21 1527 04/30/21 0718 05/03/21 0656  INR 2.1* 2.0* 1.8*     Cardiac Enzymes: No results for input(s): CKTOTAL, CKMB, CKMBINDEX, TROPONINI in the last 168 hours.  HbA1C: Hgb A1c MFr Bld  Date/Time Value Ref Range Status  04/28/2021 03:29 AM 4.3 (L) 4.8 - 5.6 % Final    Comment:    (NOTE) Pre diabetes:          5.7%-6.4%  Diabetes:              >6.4%  Glycemic control for   <7.0% adults with diabetes     CBG: Recent Labs  Lab 05/04/21 1125 05/04/21 1525 05/04/21 1946 05/04/21 2211 05/05/21 0728  GLUCAP 132* 126* 233* 214* 115*

## 2021-05-05 NOTE — Evaluation (Signed)
Occupational Therapy Evaluation Patient Details Name: Courtney Braun MRN: [id number] DOB: 1950-07-17 Today's Date: 05/05/2021   History of Present Illness Courtney Braun is a 71 y.o. female who presented with increased SOB and edema. CT chest/abdomen showed large R sided pleural effusion, ascites, abd liver cirrhosis with portal hypertension. Underwent thoracentesis on 2/10. Due to acute hypoxic event in setting of multilobar PNA, pt required intubation on 2/11 and extubated on 2/16. PMH: NASH cirrhosis, esophageal varices bleeding 2018, HTN, IIDM, HLD   Clinical Impression   PTA, pt lives alone and reports complete Independence in all ADLs, IADLs and mobility without use of AD. Pt presents now with deficits in strength, balance, cardiopulmonary tolerance and coordination. Pt requires Mod A for UB ADLs, Max A (+2 for safety in standing) for LB ADLs, and up to Mod A x 2 (noted L lateral lean) for basic transfers due to deficits. Pt also noted with tremors in B hands (reports this is baseline) that worsened by end of session. Based on current presentation, recommend SNF rehab due to limited support at DC though pt hopeful to progress home. Plan to trial RW and reinforce energy conservation strategies in next sessions.  SpO2 >90% on 2 L O2 (after switching to ear probe) BP pre activity: 116/50 (68) BP after standing: 99/42 (56) BP after sitting up > 5 min: 102/67 (79)     Recommendations for follow up therapy are one component of a multi-disciplinary discharge planning process, led by the attending physician.  Recommendations may be updated based on patient status, additional functional criteria and insurance authorization.   Follow Up Recommendations  Skilled nursing-short term rehab (<3 hours/day)    Assistance Recommended at Discharge Frequent or constant Supervision/Assistance  Patient can return home with the following A lot of help with walking and/or transfers;A lot of help with  bathing/dressing/bathroom;Assistance with cooking/housework    Functional Status Assessment  Patient has had a recent decline in their functional status and demonstrates the ability to make significant improvements in function in a reasonable and predictable amount of time.  Equipment Recommendations  BSC/3in1;Other (comment) (Rolling walker)    Recommendations for Other Services       Precautions / Restrictions Precautions Precautions: Fall;Other (comment) Precaution Comments: monitor O2 (does not wear at baseline); monitor for orthostatics; tremulous Restrictions Weight Bearing Restrictions: No      Mobility Bed Mobility Overal bed mobility: Needs Assistance Bed Mobility: Supine to Sit     Supine to sit: Min assist, HOB elevated     General bed mobility comments: assist to scoot hips forward, L lateral lean noted    Transfers Overall transfer level: Needs assistance Equipment used: 2 person hand held assist (no RW in room) Transfers: Sit to/from Stand, Bed to chair/wheelchair/BSC Sit to Stand: Min assist, +2 physical assistance, +2 safety/equipment     Step pivot transfers: Mod assist, +2 physical assistance, +2 safety/equipment     General transfer comment: Min A x 2 to stand from bedside and gain balance. Mod A x 2 for taking steps to chair with handheld assist and noted L lateral lean; cues for sequencing      Balance Overall balance assessment: Needs assistance Sitting-balance support: No upper extremity supported, Feet supported Sitting balance-Leahy Scale: Fair     Standing balance support: Bilateral upper extremity supported, During functional activity Standing balance-Leahy Scale: Poor  ADL either performed or assessed with clinical judgement   ADL Overall ADL's : Needs assistance/impaired Eating/Feeding: Set up;Sitting Eating/Feeding Details (indicate cue type and reason): due to tremors, assisted in pouring  drinks into cups w/ lids/straws to decrease spillage Grooming: Minimal assistance;Sitting   Upper Body Bathing: Moderate assistance;Sitting   Lower Body Bathing: Maximal assistance;Sit to/from stand;+2 for safety/equipment   Upper Body Dressing : Moderate assistance;Sitting   Lower Body Dressing: Maximal assistance;+2 for safety/equipment;Sit to/from stand   Toilet Transfer: Moderate assistance;+2 for safety/equipment;+2 for physical assistance;Stand-pivot   Toileting- Clothing Manipulation and Hygiene: Maximal assistance;+2 for safety/equipment;Sit to/from stand         General ADL Comments: Limited by weakness from prolonged time in bed, tremors impairing coordination and noted with L lateral lean in standing/sitting     Vision Baseline Vision/History: 1 Wears glasses Ability to See in Adequate Light: 1 Impaired Patient Visual Report: No change from baseline Vision Assessment?: No apparent visual deficits     Perception     Praxis      Pertinent Vitals/Pain Pain Assessment Pain Assessment: No/denies pain     Hand Dominance Right   Extremity/Trunk Assessment Upper Extremity Assessment Upper Extremity Assessment: RUE deficits/detail;LUE deficits/detail RUE Deficits / Details: tremulous; pt reports this occurs at baseline and typically improves by the end of the day; mild edema. RUE Coordination: decreased fine motor LUE Deficits / Details: tremulous; pt reports this occurs at baseline and typically improves by the end of the day; mild edema. LUE Coordination: decreased fine motor   Lower Extremity Assessment Lower Extremity Assessment: Defer to PT evaluation   Cervical / Trunk Assessment Cervical / Trunk Assessment: Normal   Communication Communication Communication: No difficulties   Cognition Arousal/Alertness: Awake/alert Behavior During Therapy: WFL for tasks assessed/performed Overall Cognitive Status: Within Functional Limits for tasks assessed                                  General Comments: slower to respond/problem solve at times but [facility]     General Comments       Exercises     Shoulder Instructions      Home Living Family/patient expects to be discharged to:: Private residence Living Arrangements: Alone Available Help at Discharge: Family;Available PRN/intermittently Type of Home: House Home Access: Level entry     Home Layout: One level     Bathroom Shower/Tub: Occupational psychologist: Handicapped height     Home Equipment: Shower seat - built in;Cane - single point;Cane - quad          Prior Functioning/Environment Prior Level of Function : Independent/Modified Independent;Driving             Mobility Comments: no use of AD for mobility ADLs Comments: Independent with ADLs, IADLs, driving, grocery shopping. has small dog she cares for        OT Problem List: Decreased strength;Decreased activity tolerance;Impaired balance (sitting and/or standing);Decreased coordination;Decreased knowledge of use of DME or AE;Cardiopulmonary status limiting activity;Increased edema      OT Treatment/Interventions: Self-care/ADL training;Therapeutic exercise;Energy conservation;DME and/or AE instruction;Therapeutic activities;Patient/family education;Balance training    OT Goals(Current goals can be found in the care plan section) Acute Rehab OT Goals Patient Stated Goal: be able to progress home, return to independence OT Goal Formulation: With patient Time For Goal Achievement: 05/19/21 Potential to Achieve Goals: Good  OT Frequency: Min 2X/week    Co-evaluation  AM-PAC OT "6 Clicks" Daily Activity     Outcome Measure Help from another person eating meals?: A Little Help from another person taking care of personal grooming?: A Little Help from another person toileting, which includes using toliet, bedpan, or urinal?: A Lot Help from another person bathing (including  washing, rinsing, drying)?: A Lot Help from another person to put on and taking off regular upper body clothing?: A Lot Help from another person to put on and taking off regular lower body clothing?: A Lot 6 Click Score: 14   End of Session Equipment Utilized During Treatment: Gait belt;Oxygen Nurse Communication: Mobility status  Activity Tolerance: Patient tolerated treatment well Patient left: in chair;with call bell/phone within reach  OT Visit Diagnosis: Unsteadiness on feet (R26.81);Other abnormalities of gait and mobility (R26.89);Muscle weakness (generalized) (M62.81)                Time: 0712-0753 OT Time Calculation (min): 41 min Charges:  OT General Charges $OT Visit: 1 Visit OT Evaluation $OT Eval Moderate Complexity: 1 Mod OT Treatments $Self Care/Home Management : 8-22 mins $Therapeutic Activity: 8-22 mins  Malachy Chamber, OTR/L Acute Rehab Services Office: [phone]   Layla Maw 05/05/2021, 8:14 AM

## 2021-05-06 DIAGNOSIS — Z9889 Other specified postprocedural states: Secondary | ICD-10-CM

## 2021-05-06 LAB — COMPREHENSIVE METABOLIC PANEL
ALT: 21 U/L (ref 0–44)
AST: 33 U/L (ref 15–41)
Albumin: 2.1 g/dL — ABNORMAL LOW (ref 3.5–5.0)
Alkaline Phosphatase: 57 U/L (ref 38–126)
Anion gap: 4 — ABNORMAL LOW (ref 5–15)
BUN: 28 mg/dL — ABNORMAL HIGH (ref 8–23)
CO2: 36 mmol/L — ABNORMAL HIGH (ref 22–32)
Calcium: 8.2 mg/dL — ABNORMAL LOW (ref 8.9–10.3)
Chloride: 98 mmol/L (ref 98–111)
Creatinine, Ser: 0.81 mg/dL (ref 0.44–1.00)
GFR, Estimated: >60 mL/min (ref >60)
Glucose, Bld: 102 mg/dL — ABNORMAL HIGH (ref 70–99)
Potassium: 3.6 mmol/L (ref 3.5–5.1)
Sodium: 138 mmol/L (ref 135–145)
Total Bilirubin: 1.2 mg/dL (ref 0.3–1.2)
Total Protein: 5.4 g/dL — ABNORMAL LOW (ref 6.5–8.1)

## 2021-05-06 LAB — GLUCOSE, CAPILLARY
Glucose-Capillary: 99 mg/dL (ref 70–99)
Glucose-Capillary: 190 mg/dL — ABNORMAL HIGH (ref 70–99)
Glucose-Capillary: 183 mg/dL — ABNORMAL HIGH (ref 70–99)
Glucose-Capillary: 157 mg/dL — ABNORMAL HIGH (ref 70–99)

## 2021-05-06 LAB — CBC
HCT: 27.9 % — ABNORMAL LOW (ref 36.0–46.0)
Hemoglobin: 8.9 g/dL — ABNORMAL LOW (ref 12.0–15.0)
MCH: 30.5 pg (ref 26.0–34.0)
MCHC: 31.9 g/dL (ref 30.0–36.0)
MCV: 95.5 fL (ref 80.0–100.0)
Platelets: 56 10*3/uL — ABNORMAL LOW (ref 150–400)
RBC: 2.92 MIL/uL — ABNORMAL LOW (ref 3.87–5.11)
RDW: 15.9 % — ABNORMAL HIGH (ref 11.5–15.5)
WBC: 5.4 10*3/uL (ref 4.0–10.5)
nRBC: 0 % (ref 0.0–0.2)

## 2021-05-06 LAB — HEPARIN INDUCED PLATELET AB (HIT ANTIBODY): Heparin Induced Plt Ab: 0.181 OD (ref 0.000–0.400)

## 2021-05-06 MED ORDER — ALBUMIN HUMAN 5 % IV SOLN
25.0000 g | Freq: Four times a day (QID) | INTRAVENOUS | Status: DC
  Administered 2021-05-06 (×2): 25 g via INTRAVENOUS
  Filled 2021-05-06 (×3): qty 500

## 2021-05-06 MED ORDER — ALBUMIN HUMAN 25 % IV SOLN
25.0000 g | Freq: Four times a day (QID) | INTRAVENOUS | Status: AC
  Administered 2021-05-06 – 2021-05-07 (×2): 25 g via INTRAVENOUS
  Filled 2021-05-06 (×2): qty 100

## 2021-05-06 NOTE — Progress Notes (Signed)
°  Transition of Care [facility]) Screening Note   Patient Details  Name: Courtney Braun Date of Birth: Jul 22, 1950   Transition of Care [facility]) CM/SW Contact:    Benard Halsted, LCSW Phone Number: 05/06/2021, 8:37 AM    Transition of Care Department [facility]) has reviewed patient and will follow up on SNF consult. We will continue to monitor patient advancement through interdisciplinary progression rounds. If new patient transition needs arise, please place a TOC consult.

## 2021-05-06 NOTE — Progress Notes (Addendum)
PROGRESS NOTE    Courtney Braun  ZGY:[id number] DOB: 1950/04/28 DOA: 04/27/2021 PCP: Mayra Neer, MD  Narrative71/f with a history of [name] cirrhosis complicated by portal hypertension, gastric varices,esophageal varices, chronic anemia and right hepatic hydrothorax.  Also with a history of diabetes, hypertension, hyperlipidemia. Admitted 2/9 with progressive abdominal distention and associated dyspnea ongoing decline for 1 year. -Admitted, started on diuretics, she underwent a right thoracentesis on 2/10.   -Subsequently developed acute respiratory distress and healthcare associated pneumonia with worsening hypoxia , subsequently transferred to the ICU and intubated, repeat x-ray noted bilateral alveolar infiltrates especially in the right  -Treated with broad-spectrum antibiotics, pleural fluid was transudative, Gram stain negative -Interventional radiology was also consulted for TIPS eval, CT angio abdomen pelvis noted chronic portal vein thrombosis, hepatic hydrothorax, gastro renal shunting and bilateral patchy airspace opacities -Subsequently extubated 2/16  Subjective: -Feels okay overall, breathing is improving  Assessment & Plan:  Acute hypoxemic respiratory failure Hepatic Hydrothorax Healthcare associated pneumonia -Extubated 2/16 Diffuse bilateral pulmonary infiltrates on CT concerning for HCAP.  -Clinically improving, O2 weaned down to 2 to 3 L -Completed antibiotic course 2/17, now off, blood and trach aspirate cultures are negative -Underwent thoracentesis 2/10-1.5 L drained, fluid was transudative, cultures negative -Clinically improving, continue IV Lasix and Aldactone, add IV albumin today -Continue bronchodilators -Ambulate increase activity, transfer out of ICU   Septic Shock -Resolved, weaned off antibiotics and pressors   Decompensated Nash cirrhosis.   With portal gastropathy, esophageal and gastric varices and hepatic hydrothorax. IR consulted  patient is  not a candidate for TIPS except in case of life-threatening gastric variceal bleeding. Continue with medical management. - GI following, Continue to appreciate recommendations - continue diuresis with lasix and aldactone  -Off octreotide -Overall prognosis is poor, discuss with gastroenterology  Chronic portal vein thrombosis -Noted on imaging this admission, started on anticoagulation for this, was on IV heparin initially transition to Eliquis yesterday in ICU -Will discuss with gastroenterology, she is at high risk for bleeding with known gastric, esophageal varices, severe thrombocytopenia   History of hepatic encephalopathy - On lactulose at home, continued -Continue rifaximin   Anemia of Chronic disease -Stable, monitor hemoglobin periodically   Diabetes with hyperglycemia - SSI PRN -   DVT prophylaxis: Apixaban Code Status: Full code Family Communication: Discussed with patient in detail, no family at bedside Disposition Plan: Transfer out of ICU today, may need SNF   Consultants:  gastroenterology, IR, PCCM transfer  Procedures:   Antimicrobials:    Objective: Vitals:   05/06/21 0500 05/06/21 0600 05/06/21 0800 05/06/21 0900  BP: (!) 118/52 (!) 102/41 117/62 120/84  Pulse: 68 64 76 76  Resp: _0 (!) 22  Temp:   98.8 F (37.1 C)   TempSrc:   Oral   SpO2: 94% 95% 95% 96%  Weight: 111 kg     Height:        Intake/Output Summary (Last 24 hours) at 05/06/2021 1005 Last data filed at 05/06/2021 0600 Gross per 24 hour  Intake 1332.73 ml  Output 2200 ml  Net -867.27 ml   Filed Weights   05/04/21 0500 05/05/21 0500 05/06/21 0500  Weight: 110.4 kg 111.3 kg 111 kg    Examination:  General exam: Chronically ill female sitting up in bed, AAO x3, no distress HEENT: Positive JVD CVS: S1-S2, regular rhythm Lungs: Few basilar rales Abdomen: Soft, obese, nontender, positive fluid thrill Extremities: 1+ edema bilaterally Skin: No rashes Psychiatry:  Mood &  affect  appropriate.     Data Reviewed:   CBC: Recent Labs  Lab 04/30/21 0718 05/02/21 0147 05/03/21 0656 05/04/21 1101 05/05/21 0603 05/06/21 0155  WBC 17.9*  --  9.4 8.2 6.0 5.4  HGB 10.0* 8.2* 10.0* 9.9* 9.5* 8.9*  HCT 31.3* 24.0* 31.8* 31.0* 29.9* 27.9*  MCV 97.8  --  97.2 96.6 99.3 95.5  PLT PLATELET CLUMPS NOTED ON SMEAR, UNABLE TO ESTIMATE  --  101* 63* 55* 56*   Basic Metabolic Panel: Recent Labs  Lab 04/29/21 1628 04/29/21 1655 04/30/21 0718 04/30/21 1811 05/01/21 0549 05/02/21 0145 05/02/21 0147 05/03/21 0656 05/04/21 1101 05/05/21 0603 05/06/21 0155  NA  --    < > 140  --  140 143 143 145 148* 144 138  K  --    < > 4.2  --  4.3 4.2 4.0 4.1 4.8 5.0 3.6  CL  --    < > 106  --  105 107  --  108 107 105 98  CO2  --    < > 26  --  28 25  --  31 32 33* 36*  GLUCOSE  --    < > 138*  --  167* 143*  --  127* 168* 129* 102*  BUN  --    < > 38*  --  48* 49*  --  48* 43* 36* 28*  CREATININE  --    < > 1.09*  --  1.00 0.97  --  0.86 0.89 0.80 0.81  CALCIUM  --    < > 8.8*  --  8.8* 9.3  --  8.7* 8.7* 8.3* 8.2*  MG 1.8  --  2.0 2.1 2.1  --   --   --   --   --   --   PHOS 4.1  --  3.9 4.8* 4.2  --   --   --   --   --   --    < > = values in this interval not displayed.   GFR: Estimated Creatinine Clearance: 77.4 mL/min (by C-G formula based on SCr of 0.81 mg/dL). Liver Function Tests: Recent Labs  Lab 04/30/21 0718 05/03/21 0656 05/05/21 0603 05/06/21 0155  AST 39 35 66* 33  ALT _0 ALKPHOS 62 63 56 57  BILITOT 2.3* 2.2* 1.5* 1.2  PROT 5.9* 6.5 5.6* 5.4*  ALBUMIN 2.9* 2.8* 2.3* 2.1*   No results for input(s): LIPASE, AMYLASE in the last 168 hours. No results for input(s): AMMONIA in the last 168 hours. Coagulation Profile: Recent Labs  Lab 04/29/21 1527 04/30/21 0718 05/03/21 0656  INR 2.1* 2.0* 1.8*   Cardiac Enzymes: No results for input(s): CKTOTAL, CKMB, CKMBINDEX, TROPONINI in the last 168 hours. BNP (last 3 results) No results for  input(s): PROBNP in the last 8760 hours. HbA1C: No results for input(s): HGBA1C in the last 72 hours. CBG: Recent Labs  Lab 05/05/21 0805 05/05/21 1205 05/05/21 1613 05/05/21 2107 05/06/21 0747  GLUCAP 119* 137* 223* 151* 99   Lipid Profile: No results for input(s): CHOL, HDL, LDLCALC, TRIG, CHOLHDL, LDLDIRECT in the last 72 hours. Thyroid Function Tests: No results for input(s): TSH, T4TOTAL, FREET4, T3FREE, THYROIDAB in the last 72 hours. Anemia Panel: No results for input(s): VITAMINB12, FOLATE, FERRITIN, TIBC, IRON, RETICCTPCT in the last 72 hours. Urine analysis: No results found for: COLORURINE, APPEARANCEUR, LABSPEC, PHURINE, GLUCOSEU, HGBUR, BILIRUBINUR, KETONESUR, PROTEINUR, UROBILINOGEN, NITRITE, LEUKOCYTESUR Sepsis Labs: _1 (procalcitonin:4,lacticidven:4)  ) Recent Results (from the  past 240 hour(s))  Resp Panel by RT-PCR (Flu A&B, Covid) Nasopharyngeal Swab     Status: None   Collection Time: 04/27/21  4:50 PM   Specimen: Nasopharyngeal Swab; Nasopharyngeal(NP) swabs in vial transport medium  Result Value Ref Range Status   SARS Coronavirus 2 by RT PCR NEGATIVE NEGATIVE Final    Comment: (NOTE) SARS-CoV-2 target nucleic acids are NOT DETECTED.  The SARS-CoV-2 RNA is generally detectable in upper respiratory specimens during the acute phase of infection. The lowest concentration of SARS-CoV-2 viral copies this assay can detect is 138 copies/mL. A negative result does not preclude SARS-Cov-2 infection and should not be used as the sole basis for treatment or other patient management decisions. A negative result may occur with  improper specimen collection/handling, submission of specimen other than nasopharyngeal swab, presence of viral mutation(s) within the areas targeted by this assay, and inadequate number of viral copies(<138 copies/mL). A negative result must be combined with clinical observations, patient history, and epidemiological information.  The expected result is Negative.  Fact Sheet for Patients:  EntrepreneurPulse.com.au  Fact Sheet for Healthcare Providers:  IncredibleEmployment.be  This test is no t yet approved or cleared by the [place] FDA and  has been authorized for detection and/or diagnosis of SARS-CoV-2 by FDA under an Emergency Use Authorization (EUA). This EUA will remain  in effect (meaning this test can be used) for the duration of the COVID-19 declaration under Section 564(b)(1) of the Act, 21 U.S.C.section 360bbb-3(b)(1), unless the authorization is terminated  or revoked sooner.       Influenza A by PCR NEGATIVE NEGATIVE Final   Influenza B by PCR NEGATIVE NEGATIVE Final    Comment: (NOTE) The Xpert Xpress SARS-CoV-2/FLU/RSV plus assay is intended as an aid in the diagnosis of influenza from Nasopharyngeal swab specimens and should not be used as a sole basis for treatment. Nasal washings and aspirates are unacceptable for Xpert Xpress SARS-CoV-2/FLU/RSV testing.  Fact Sheet for Patients: EntrepreneurPulse.com.au  Fact Sheet for Healthcare Providers: IncredibleEmployment.be  This test is not yet approved or cleared by the [place] FDA and has been authorized for detection and/or diagnosis of SARS-CoV-2 by FDA under an Emergency Use Authorization (EUA). This EUA will remain in effect (meaning this test can be used) for the duration of the COVID-19 declaration under Section 564(b)(1) of the Act, 21 U.S.C. section 360bbb-3(b)(1), unless the authorization is terminated or revoked.  Performed at [facility], [place] [address], [place], [place] [address]   Culture, body fluid w Gram Stain-bottle     Status: None   Collection Time: 04/28/21 12:54 PM   Specimen: Pleura  Result Value Ref Range Status   Specimen Description PLEURAL FLUID RIGHT  Final   Special Requests NONE  Final   Culture   Final    NO  GROWTH 5 DAYS Performed at [place] [address], [place], [place] [address]    Report Status 05/03/2021 FINAL  Final  Gram stain     Status: None   Collection Time: 04/28/21 12:54 PM   Specimen: Pleura  Result Value Ref Range Status   Specimen Description PLEURAL FLUID RIGHT  Final   Special Requests NONE  Final   Gram Stain   Final    WBC PRESENT, PREDOMINANTLY MONONUCLEAR NO ORGANISMS SEEN CYTOSPIN SMEAR Performed at [facility], [place] [address], [place], [place] [address]    Report Status 04/28/2021 FINAL  Final  MRSA Next Gen by PCR,  Nasal     Status: Abnormal   Collection Time: 04/29/21  3:18 PM   Specimen: Nasal Mucosa; Nasal Swab  Result Value Ref Range Status   MRSA by PCR Next Gen DETECTED (A) NOT DETECTED Final    Comment: RESULT CALLED TO, READ BACK BY AND VERIFIED WITH: A,HAFNER _0  04/29/21 EB (NOTE) The GeneXpert MRSA Assay (FDA approved for NASAL specimens only), is one component of a comprehensive MRSA colonization surveillance program. It is not intended to diagnose MRSA infection nor to guide or monitor treatment for MRSA infections. Test performance is not FDA approved in patients less than 74 years old. Performed at [facility], [place] [address] [place], [place] [address]   Culture, blood (routine x 2)     Status: None   Collection Time: 04/29/21  3:27 PM   Specimen: BLOOD  Result Value Ref Range Status   Specimen Description BLOOD SITE NOT SPECIFIED  Final   Special Requests   Final    BOTTLES DRAWN AEROBIC ONLY Blood Culture results may not be optimal due to an inadequate volume of blood received in culture bottles   Culture   Final    NO GROWTH 5 DAYS Performed at [facility], [place] [address], [place], [place] [address]    Report Status 05/04/2021 FINAL  Final  Culture, blood (routine x 2)     Status: None   Collection Time: 04/29/21  3:27 PM   Specimen: BLOOD  Result Value Ref Range Status   Specimen Description  BLOOD SITE NOT SPECIFIED  Final   Special Requests   Final    BOTTLES DRAWN AEROBIC ONLY Blood Culture results may not be optimal due to an inadequate volume of blood received in culture bottles   Culture   Final    NO GROWTH 5 DAYS Performed at [facility], [place] [address], [place], [place] [address]    Report Status 05/04/2021 FINAL  Final  Culture, Respiratory w Gram Stain     Status: None   Collection Time: 04/29/21  3:32 PM   Specimen: Tracheal Aspirate; Respiratory  Result Value Ref Range Status   Specimen Description TRACHEAL ASPIRATE  Final   Special Requests NONE  Final   Gram Stain   Final    FEW WBC PRESENT,BOTH PMN AND MONONUCLEAR NO ORGANISMS SEEN    Culture   Final    Normal respiratory flora-no Staph aureus or Pseudomonas seen Performed at [facility], [address] [place], [place] [address]    Report Status 05/01/2021 FINAL  Final     Radiology Studies: No results found.   Scheduled Meds:  apixaban  10 mg Oral BID   Followed by   Derrill Memo ON 05/12/2021] apixaban  5 mg Oral BID   chlorhexidine gluconate (MEDLINE KIT)  15 mL Mouth Rinse BID   Chlorhexidine Gluconate Cloth  6 each Topical Daily   feeding supplement  237 mL Oral BID BM   furosemide  40 mg Intravenous BID   Gerhardt's butt cream   Topical TID   insulin aspart  1-3 Units Subcutaneous TID AC & HS   multivitamin with minerals  1 tablet Oral Daily   pantoprazole  40 mg Oral Daily   rifaximin  550 mg Oral BID   simvastatin  40 mg Oral QHS   sodium chloride flush  3 mL Intravenous Q12H   sodium chloride flush  3 mL Intravenous Q12H   spironolactone  100 mg Oral  Daily   Continuous Infusions:  sodium chloride 10 mL/hr at 05/04/21 2243   sodium chloride Stopped (05/05/21 1416)   sodium chloride     albumin human 25 g (05/06/21 0833)     LOS: 9 days    Time spent: 83mn    PDomenic Polite MD [facility]   05/06/2021, 10:05 AM

## 2021-05-06 NOTE — Progress Notes (Signed)
Pt transferred to 1N24 with no complications.  Pt belongings which included a cell phone was transferred with pt.  Pt daughter with pt at time of transfer.  Irven [place], RN

## 2021-05-06 NOTE — Progress Notes (Signed)
Notified Dr. Alcario Drought to clarify patient's human albumin order. Dr. Alcario Drought changed order to 25% albumin instead of 5%. Told Dr. Alcario Drought that patient has received 1.5 doses of the current albumin order. Dr. Alcario Drought stated not to give the other 24m container of the 2nd dose, just give the 2 doses of the 25%. MD changed order in computer. Will continue to monitor pt. JRacheal Patches RN

## 2021-05-07 LAB — CBC
HCT: 26.8 % — ABNORMAL LOW (ref 36.0–46.0)
Hemoglobin: 8.9 g/dL — ABNORMAL LOW (ref 12.0–15.0)
MCH: 31.4 pg (ref 26.0–34.0)
MCHC: 33.2 g/dL (ref 30.0–36.0)
MCV: 94.7 fL (ref 80.0–100.0)
Platelets: 42 10*3/uL — ABNORMAL LOW (ref 150–400)
RBC: 2.83 MIL/uL — ABNORMAL LOW (ref 3.87–5.11)
RDW: 15.8 % — ABNORMAL HIGH (ref 11.5–15.5)
WBC: 4.2 10*3/uL (ref 4.0–10.5)
nRBC: 0 % (ref 0.0–0.2)

## 2021-05-07 LAB — GLUCOSE, CAPILLARY
Glucose-Capillary: 101 mg/dL — ABNORMAL HIGH (ref 70–99)
Glucose-Capillary: 193 mg/dL — ABNORMAL HIGH (ref 70–99)
Glucose-Capillary: 183 mg/dL — ABNORMAL HIGH (ref 70–99)
Glucose-Capillary: 152 mg/dL — ABNORMAL HIGH (ref 70–99)

## 2021-05-07 LAB — COMPREHENSIVE METABOLIC PANEL
ALT: 18 U/L (ref 0–44)
AST: 32 U/L (ref 15–41)
Albumin: 2.7 g/dL — ABNORMAL LOW (ref 3.5–5.0)
Alkaline Phosphatase: 48 U/L (ref 38–126)
Anion gap: 6 (ref 5–15)
BUN: 22 mg/dL (ref 8–23)
CO2: 35 mmol/L — ABNORMAL HIGH (ref 22–32)
Calcium: 8.2 mg/dL — ABNORMAL LOW (ref 8.9–10.3)
Chloride: 95 mmol/L — ABNORMAL LOW (ref 98–111)
Creatinine, Ser: 0.68 mg/dL (ref 0.44–1.00)
GFR, Estimated: >60 mL/min (ref >60)
Glucose, Bld: 112 mg/dL — ABNORMAL HIGH (ref 70–99)
Potassium: 3.4 mmol/L — ABNORMAL LOW (ref 3.5–5.1)
Sodium: 136 mmol/L (ref 135–145)
Total Bilirubin: 1.8 mg/dL — ABNORMAL HIGH (ref 0.3–1.2)
Total Protein: 5.6 g/dL — ABNORMAL LOW (ref 6.5–8.1)

## 2021-05-07 MED ORDER — POTASSIUM CHLORIDE CRYS ER 20 MEQ PO TBCR
40.0000 meq | EXTENDED_RELEASE_TABLET | Freq: Two times a day (BID) | ORAL | Status: AC
  Administered 2021-05-07 (×2): 40 meq via ORAL
  Filled 2021-05-07 (×2): qty 2

## 2021-05-07 MED ORDER — ALBUMIN HUMAN 25 % IV SOLN
25.0000 g | Freq: Four times a day (QID) | INTRAVENOUS | Status: AC
  Administered 2021-05-07 (×2): 25 g via INTRAVENOUS
  Filled 2021-05-07 (×2): qty 100

## 2021-05-07 MED ORDER — NADOLOL 20 MG PO TABS
20.0000 mg | ORAL_TABLET | Freq: Every day | ORAL | Status: DC
  Administered 2021-05-08 – 2021-05-10 (×3): 20 mg via ORAL
  Filled 2021-05-07 (×4): qty 1

## 2021-05-07 NOTE — NC FL2 (Signed)
[place] LEVEL OF CARE SCREENING TOOL     IDENTIFICATION  Patient Name: Courtney Braun Birthdate: 1950/10/17 Sex: female Admission Date (Current Location): 04/27/2021  [facility] and [place] Number:  Herbalist and Address:  [facility] [place], [place] [address], [place], [place] [address]      Provider Number: [id number]  Attending Physician Name and Address:  Domenic Polite, MD  Relative Name and Phone Number:  Marcelino Freestone Niece   [phone]    Current Level of Care: Hospital Recommended Level of Care: [place] Prior Approval Number:    Date Approved/Denied:   PASRR Number: [id number]  Discharge Plan: SNF    Current Diagnoses: Patient Active Problem List   Diagnosis Date Noted   Malnutrition of moderate degree 05/01/2021   Acute respiratory failure with hypoxia ([place]) 04/29/2021   Portal hypertension ([place]) 04/28/2021   Ascites 04/28/2021   Benign essential HTN 04/28/2021   Hx of esophageal varices 04/28/2021   Liver cirrhosis secondary to NASH ([place]) 04/27/2021   Pleural effusion, right     Orientation RESPIRATION BLADDER Height & Weight     Self, Time, Situation, Place  O2 Continent Weight: 251 lb 1.7 oz (113.9 kg) Height:  5' 5.5" (166.4 cm)  BEHAVIORAL SYMPTOMS/MOOD NEUROLOGICAL BOWEL NUTRITION STATUS      Incontinent Diet (see discharge summary)  AMBULATORY STATUS COMMUNICATION OF NEEDS Skin   Extensive Assist Verbally Normal                       Personal Care Assistance Level of Assistance  Bathing, Feeding, Dressing Bathing Assistance: Maximum assistance Feeding assistance: Limited assistance Dressing Assistance: Maximum assistance     Functional Limitations Info  Sight, Hearing, Speech Sight Info: Adequate Hearing Info: Adequate Speech Info: Adequate    SPECIAL CARE FACTORS FREQUENCY  PT (By licensed PT), OT (By licensed OT)     PT Frequency: 5x week OT Frequency: 5x week             Contractures Contractures Info: Not present    Additional Factors Info  Code Status, Allergies, Insulin Sliding Scale Code Status Info: full Allergies Info: Heparin, Metformin And Related, Voltaren (Diclofenac)   Insulin Sliding Scale Info: Novolog, 1-3 units TID before meals and QHS.  See discharge summary       Current Medications (05/07/2021):  This is the current hospital active medication list Current Facility-Administered Medications  Medication Dose Route Frequency Provider Last Rate Last Admin   0.9 %  sodium chloride infusion  250 mL Intravenous PRN Wynetta Fines T, MD 10 mL/hr at 05/04/21 2243 Infusion Verify at 05/04/21 2243   0.9 %  sodium chloride infusion  250 mL Intravenous Continuous Margaretha Seeds, MD   Stopped at 05/05/21 1416   0.9 %  sodium chloride infusion  250 mL Intravenous PRN Noemi Chapel P, DO       albuterol (PROVENTIL) (2.5 MG/3ML) 0.083% nebulizer solution 2.5 mg  2.5 mg Nebulization Q4H PRN Collene Gobble, MD       apixaban Arne Cleveland) tablet 10 mg  10 mg Oral BID Noemi Chapel P, DO   10 mg at 05/07/21 4982   Followed by   Derrill Memo ON 05/12/2021] apixaban (ELIQUIS) tablet 5 mg  5 mg Oral BID Noemi Chapel P, DO       chlorhexidine gluconate (MEDLINE KIT) (PERIDEX) 0.12 % solution 15 mL  15 mL Mouth Rinse BID Collene Gobble, MD  15 mL at 05/06/21 0834   Chlorhexidine Gluconate Cloth 2 % PADS 6 each  6 each Topical Daily Collene Gobble, MD   6 each at 05/07/21 [phone]   feeding supplement (ENSURE ENLIVE / ENSURE PLUS) liquid 237 mL  237 mL Oral BID BM Noemi Chapel P, DO   237 mL at 05/07/21 1337   furosemide (LASIX) injection 40 mg  40 mg Intravenous BID Collene Gobble, MD   40 mg at 05/07/21 3832   Gerhardt's butt cream   Topical TID Julian Hy, DO   Given at 05/07/21 0837   insulin aspart (novoLOG) injection 1-3 Units  1-3 Units Subcutaneous TID AC & HS Elmer Sow, MD   2 Units at 05/07/21 1331   lactulose (CHRONULAC) 10 GM/15ML solution  10 g  10 g Oral Daily PRN Brahmbhatt, Parag, MD       lidocaine (PF) (XYLOCAINE) 1 % injection    PRN Jacqualine Mau, NP   10 mL at 04/28/21 1230   melatonin tablet 3 mg  3 mg Oral QHS PRN Julian Hy, DO   3 mg at 05/04/21 2217   multivitamin with minerals tablet 1 tablet  1 tablet Oral Daily Noemi Chapel P, DO   1 tablet at 05/07/21 0836   nadolol (CORGARD) tablet 20 mg  20 mg Oral Daily Domenic Polite, MD       ondansetron [facility]) injection 4 mg  4 mg Intravenous Q6H PRN Wynetta Fines T, MD   4 mg at 05/03/21 0247   ondansetron (ZOFRAN) tablet 8 mg  8 mg Oral Q8H PRN Noemi Chapel P, DO       pantoprazole (PROTONIX) EC tablet 40 mg  40 mg Oral Daily Noemi Chapel P, DO   40 mg at 05/07/21 0838   potassium chloride SA (KLOR-CON M) CR tablet 40 mEq  40 mEq Oral BID Domenic Polite, MD   40 mEq at 05/07/21 0836   rifaximin (XIFAXAN) tablet 550 mg  550 mg Oral BID Julian Hy, DO   550 mg at 05/07/21 0836   simvastatin (ZOCOR) tablet 40 mg  40 mg Oral QHS Noemi Chapel P, DO   40 mg at 05/06/21 2125   sodium chloride flush (NS) 0.9 % injection 3 mL  3 mL Intravenous Q12H Wynetta Fines T, MD   3 mL at 05/06/21 0956   sodium chloride flush (NS) 0.9 % injection 3 mL  3 mL Intravenous PRN Wynetta Fines T, MD       sodium chloride flush (NS) 0.9 % injection 3 mL  3 mL Intravenous Q12H Noemi Chapel P, DO   3 mL at 05/07/21 0839   sodium chloride flush (NS) 0.9 % injection 3 mL  3 mL Intravenous PRN Noemi Chapel P, DO       spironolactone (ALDACTONE) tablet 100 mg  100 mg Oral Daily Noemi Chapel P, DO   100 mg at 05/07/21 9191     Discharge Medications: Please see discharge summary for a list of discharge medications.  Relevant Imaging Results:  Relevant Lab Results:   Additional Information SSN: [id number],  Pt is vaccinated for covid but not boosted.  Joanne Chars, LCSW

## 2021-05-07 NOTE — Progress Notes (Addendum)
PROGRESS NOTE    Courtney Braun  ZPH:[id number] DOB: 05-31-1950 DOA: 04/27/2021 PCP: Mayra Neer, MD  Narrative71/f with a history of Courtney Braun cirrhosis complicated by portal hypertension, gastric varices,esophageal varices, chronic anemia and right hepatic hydrothorax.  Also with a history of diabetes, hypertension, hyperlipidemia. Admitted 2/9 with progressive abdominal distention and associated dyspnea ongoing decline for 1 year. -Admitted, started on diuretics, she underwent a right thoracentesis on 2/10.   -Subsequently developed acute respiratory distress and healthcare associated pneumonia with worsening hypoxia , subsequently transferred to the ICU and intubated, repeat x-ray noted bilateral alveolar infiltrates especially in the right  -Treated with broad-spectrum antibiotics, pleural fluid was transudative, Gram stain negative -Interventional radiology was also consulted for TIPS eval, CT angio abdomen pelvis noted chronic portal vein thrombosis, hepatic hydrothorax, gastro renal shunting and bilateral patchy airspace opacities -Subsequently extubated 2/16 -Transferred to [facility] service 2/18  Subjective: -Feels okay overall, breathing is improving  Assessment & Plan:  Acute hypoxemic respiratory failure Hepatic Hydrothorax Healthcare associated pneumonia -Underwent thoracentesis 2/10-1.5 L drained, fluid was transudative, cultures negative -Intubated 2/11, extubated 2/16 -Diffuse bilateral pulmonary infiltrates on CT concerning for HCAP.  -Clinically improving, attempt to wean off O2 -Completed antibiotic course 2/17, now off, blood and trach aspirate cultures are negative -Clinically improving, continue IV Lasix and Aldactone, add IV albumin today -Echo with preserved EF, grade 2 diastolic dysfunction -Continue bronchodilators -Ambulate, increase activity,  -PT eval   Septic Shock -Resolved, weaned off antibiotics and pressors   Decompensated Nash cirrhosis.   With  portal gastropathy, esophageal and gastric varices and hepatic hydrothorax. IR consulted  patient is not a candidate for TIPS except in case of life-threatening gastric variceal bleeding. Continue with medical management. - GI following, Continue to appreciate recommendations - continue diuresis with lasix and aldactone  -Add nadolol -Off octreotide -Overall prognosis is poor, discuss with gastroenterology  Chronic portal vein thrombosis -Noted on imaging this admission, started on anticoagulation for this, was on IV heparin initially transition to Eliquis yesterday in ICU -Will discuss with gastroenterology, she is at high risk for bleeding with known gastric, esophageal varices, severe thrombocytopenia   History of hepatic encephalopathy - On lactulose at home, continued -Continue rifaximin   Anemia of Chronic disease -Stable, monitor hemoglobin periodically   Diabetes with hyperglycemia - SSI PRN -   DVT prophylaxis: Apixaban Code Status: Full code Family Communication: Discussed with patient in detail, no family at bedside Disposition Plan: Transfer out of ICU today, may need SNF   Consultants:  gastroenterology, IR, PCCM transfer  Procedures:   Antimicrobials:    Objective: Vitals:   05/06/21 2318 05/07/21 0336 05/07/21 0500 05/07/21 0800  BP: (!) 122/54 (!) 118/51    Pulse: 65 66    Resp: 15 15    Temp: 98.5 F (36.9 C) 98.4 F (36.9 C)  98.6 F (37 C)  TempSrc: Oral Oral  Oral  SpO2: 95% 95%  91%  Weight:   113.9 kg   Height:        Intake/Output Summary (Last 24 hours) at 05/07/2021 1230 Last data filed at 05/07/2021 0440 Gross per 24 hour  Intake 200 ml  Output 900 ml  Net -700 ml   Filed Weights   05/05/21 0500 05/06/21 0500 05/07/21 0500  Weight: 111.3 kg 111 kg 113.9 kg    Examination:  General exam: Chronically ill female sitting up in bed, AAOx3, no distress HEENT: Positive JVD CVS: S1-S2, regular rate rhythm Lungs: Few basilar  rales  Abdomen: Soft, obese, nontender, bowel sounds present Extremities: Trace edema  Skin: No rashes Psychiatry:  Mood & affect appropriate.     Data Reviewed:   CBC: Recent Labs  Lab 05/03/21 0656 05/04/21 1101 05/05/21 0603 05/06/21 0155 05/07/21 0150  WBC 9.4 8.2 6.0 5.4 4.2  HGB 10.0* 9.9* 9.5* 8.9* 8.9*  HCT 31.8* 31.0* 29.9* 27.9* 26.8*  MCV 97.2 96.6 99.3 95.5 94.7  PLT 101* 63* 55* 56* 42*   Basic Metabolic Panel: Recent Labs  Lab 04/30/21 1811 05/01/21 0549 05/02/21 0145 05/03/21 0656 05/04/21 1101 05/05/21 0603 05/06/21 0155 05/07/21 0150  NA  --  140   < > 145 148* 144 138 136  K  --  4.3   < > 4.1 4.8 5.0 3.6 3.4*  CL  --  105   < > 108 107 105 98 95*  CO2  --  28   < > 31 32 33* 36* 35*  GLUCOSE  --  167*   < > 127* 168* 129* 102* 112*  BUN  --  48*   < > 48* 43* 36* 28* 22  CREATININE  --  1.00   < > 0.86 0.89 0.80 0.81 0.68  CALCIUM  --  8.8*   < > 8.7* 8.7* 8.3* 8.2* 8.2*  MG 2.1 2.1  --   --   --   --   --   --   PHOS 4.8* 4.2  --   --   --   --   --   --    < > = values in this interval not displayed.   GFR: Estimated Creatinine Clearance: 79.6 mL/min (by C-G formula based on SCr of 0.68 mg/dL). Liver Function Tests: Recent Labs  Lab 05/03/21 0656 05/05/21 0603 05/06/21 0155 05/07/21 0150  AST 35 66* 33 32  ALT _0 ALKPHOS 63 56 57 48  BILITOT 2.2* 1.5* 1.2 1.8*  PROT 6.5 5.6* 5.4* 5.6*  ALBUMIN 2.8* 2.3* 2.1* 2.7*   No results for input(s): LIPASE, AMYLASE in the last 168 hours. No results for input(s): AMMONIA in the last 168 hours. Coagulation Profile: Recent Labs  Lab 05/03/21 0656  INR 1.8*   Cardiac Enzymes: No results for input(s): CKTOTAL, CKMB, CKMBINDEX, TROPONINI in the last 168 hours. BNP (last 3 results) No results for input(s): PROBNP in the last 8760 hours. HbA1C: No results for input(s): HGBA1C in the last 72 hours. CBG: Recent Labs  Lab 05/06/21 1136 05/06/21 1614 05/06/21 2021  05/07/21 0807 05/07/21 1154  GLUCAP 190* 183* 157* 101* 193*   Lipid Profile: No results for input(s): CHOL, HDL, LDLCALC, TRIG, CHOLHDL, LDLDIRECT in the last 72 hours. Thyroid Function Tests: No results for input(s): TSH, T4TOTAL, FREET4, T3FREE, THYROIDAB in the last 72 hours. Anemia Panel: No results for input(s): VITAMINB12, FOLATE, FERRITIN, TIBC, IRON, RETICCTPCT in the last 72 hours. Urine analysis: No results found for: COLORURINE, APPEARANCEUR, LABSPEC, PHURINE, GLUCOSEU, HGBUR, BILIRUBINUR, KETONESUR, PROTEINUR, UROBILINOGEN, NITRITE, LEUKOCYTESUR Sepsis Labs: _1 (procalcitonin:4,lacticidven:4)  ) Recent Results (from the past 240 hour(s))  Resp Panel by RT-PCR (Flu A&B, Covid) Nasopharyngeal Swab     Status: None   Collection Time: 04/27/21  4:50 PM   Specimen: Nasopharyngeal Swab; Nasopharyngeal(NP) swabs in vial transport medium  Result Value Ref Range Status   SARS Coronavirus 2 by RT PCR NEGATIVE NEGATIVE Final    Comment: (NOTE) SARS-CoV-2 target nucleic acids are NOT DETECTED.  The SARS-CoV-2 RNA is generally detectable in  upper respiratory specimens during the acute phase of infection. The lowest concentration of SARS-CoV-2 viral copies this assay can detect is 138 copies/mL. A negative result does not preclude SARS-Cov-2 infection and should not be used as the sole basis for treatment or other patient management decisions. A negative result may occur with  improper specimen collection/handling, submission of specimen other than nasopharyngeal swab, presence of viral mutation(s) within the areas targeted by this assay, and inadequate number of viral copies(<138 copies/mL). A negative result must be combined with clinical observations, patient history, and epidemiological information. The expected result is Negative.  Fact Sheet for Patients:  EntrepreneurPulse.com.au  Fact Sheet for Healthcare Providers:   IncredibleEmployment.be  This test is no t yet approved or cleared by the [place] FDA and  has been authorized for detection and/or diagnosis of SARS-CoV-2 by FDA under an Emergency Use Authorization (EUA). This EUA will remain  in effect (meaning this test can be used) for the duration of the COVID-19 declaration under Section 564(b)(1) of the Act, 21 U.S.C.section 360bbb-3(b)(1), unless the authorization is terminated  or revoked sooner.       Influenza A by PCR NEGATIVE NEGATIVE Final   Influenza B by PCR NEGATIVE NEGATIVE Final    Comment: (NOTE) The Xpert Xpress SARS-CoV-2/FLU/RSV plus assay is intended as an aid in the diagnosis of influenza from Nasopharyngeal swab specimens and should not be used as a sole basis for treatment. Nasal washings and aspirates are unacceptable for Xpert Xpress SARS-CoV-2/FLU/RSV testing.  Fact Sheet for Patients: EntrepreneurPulse.com.au  Fact Sheet for Healthcare Providers: IncredibleEmployment.be  This test is not yet approved or cleared by the [place] FDA and has been authorized for detection and/or diagnosis of SARS-CoV-2 by FDA under an Emergency Use Authorization (EUA). This EUA will remain in effect (meaning this test can be used) for the duration of the COVID-19 declaration under Section 564(b)(1) of the Act, 21 U.S.C. section 360bbb-3(b)(1), unless the authorization is terminated or revoked.  Performed at [facility], [place] [address], [place], [place] [address]   Culture, body fluid w Gram Stain-bottle     Status: None   Collection Time: 04/28/21 12:54 PM   Specimen: Pleura  Result Value Ref Range Status   Specimen Description PLEURAL FLUID RIGHT  Final   Special Requests NONE  Final   Culture   Final    NO GROWTH 5 DAYS Performed at [place] [address] [place], [place] [address]    Report Status 05/03/2021 FINAL  Final  Gram stain      Status: None   Collection Time: 04/28/21 12:54 PM   Specimen: Pleura  Result Value Ref Range Status   Specimen Description PLEURAL FLUID RIGHT  Final   Special Requests NONE  Final   Gram Stain   Final    WBC PRESENT, PREDOMINANTLY MONONUCLEAR NO ORGANISMS SEEN CYTOSPIN SMEAR Performed at [facility], [place] [address], [place], [place] [address]    Report Status 04/28/2021 FINAL  Final  MRSA Next Gen by PCR, Nasal     Status: Abnormal   Collection Time: 04/29/21  3:18 PM   Specimen: Nasal Mucosa; Nasal Swab  Result Value Ref Range Status   MRSA by PCR Next Gen DETECTED (A) NOT DETECTED Final    Comment: RESULT CALLED TO, READ BACK BY AND VERIFIED WITH: A,HAFNER _0  04/29/21 EB (NOTE) The GeneXpert MRSA Assay (FDA approved for NASAL specimens only), is one component of a comprehensive MRSA  colonization surveillance program. It is not intended to diagnose MRSA infection nor to guide or monitor treatment for MRSA infections. Test performance is not FDA approved in patients less than 54 years old. Performed at [facility], [place] [address], [place], [place] [address]   Culture, blood (routine x 2)     Status: None   Collection Time: 04/29/21  3:27 PM   Specimen: BLOOD  Result Value Ref Range Status   Specimen Description BLOOD SITE NOT SPECIFIED  Final   Special Requests   Final    BOTTLES DRAWN AEROBIC ONLY Blood Culture results may not be optimal due to an inadequate volume of blood received in culture bottles   Culture   Final    NO GROWTH 5 DAYS Performed at [facility], [place] [address] [place], [place] [address]    Report Status 05/04/2021 FINAL  Final  Culture, blood (routine x 2)     Status: None   Collection Time: 04/29/21  3:27 PM   Specimen: BLOOD  Result Value Ref Range Status   Specimen Description BLOOD SITE NOT SPECIFIED  Final   Special Requests   Final    BOTTLES DRAWN AEROBIC ONLY Blood Culture results may not be optimal due to an  inadequate volume of blood received in culture bottles   Culture   Final    NO GROWTH 5 DAYS Performed at [facility], [place] [address], [place], [place] [address]    Report Status 05/04/2021 FINAL  Final  Culture, Respiratory w Gram Stain     Status: None   Collection Time: 04/29/21  3:32 PM   Specimen: Tracheal Aspirate; Respiratory  Result Value Ref Range Status   Specimen Description TRACHEAL ASPIRATE  Final   Special Requests NONE  Final   Gram Stain   Final    FEW WBC PRESENT,BOTH PMN AND MONONUCLEAR NO ORGANISMS SEEN    Culture   Final    Normal respiratory flora-no Staph aureus or Pseudomonas seen Performed at [facility], [address] [place], [place] [address]    Report Status 05/01/2021 FINAL  Final     Radiology Studies: No results found.   Scheduled Meds:  apixaban  10 mg Oral BID   Followed by   Derrill Memo ON 05/12/2021] apixaban  5 mg Oral BID   chlorhexidine gluconate (MEDLINE KIT)  15 mL Mouth Rinse BID   Chlorhexidine Gluconate Cloth  6 each Topical Daily   feeding supplement  237 mL Oral BID BM   furosemide  40 mg Intravenous BID   Gerhardt's butt cream   Topical TID   insulin aspart  1-3 Units Subcutaneous TID AC & HS   multivitamin with minerals  1 tablet Oral Daily   pantoprazole  40 mg Oral Daily   potassium chloride  40 mEq Oral BID   rifaximin  550 mg Oral BID   simvastatin  40 mg Oral QHS   sodium chloride flush  3 mL Intravenous Q12H   sodium chloride flush  3 mL Intravenous Q12H   spironolactone  100 mg Oral Daily   Continuous Infusions:  sodium chloride 10 mL/hr at 05/04/21 2243   sodium chloride Stopped (05/05/21 1416)   sodium chloride     albumin human 25 g (05/07/21 0843)     LOS: 10 days    Time spent: 6mn    PDomenic Polite MD [facility]   05/07/2021, 12:30 PM

## 2021-05-07 NOTE — TOC Initial Note (Signed)
Transition of Care [facility]) - Initial/Assessment Note    Patient Details  Name: Courtney Braun MRN: [id number] Date of Birth: Feb 04, 1951  Transition [facility] CM/SW Contact:    Joanne Chars, LCSW Phone Number: 05/07/2021, 1:45 PM  Clinical Narrative: CSW met with pt regarding DC recommendation for SNF.  Pt agreeable, choice document given, permission given to send out referral in hub.  Permission given to speak with niece [name].  Pt lives alone.  Pt is vaccinated for covid but not boosted.  Referral sent out in hub for SNF.                    Expected Discharge Plan: Skilled Nursing Facility Barriers to Discharge: Continued Medical Work up, SNF Pending bed offer   Patient Goals and CMS Choice Patient states their goals for this hospitalization and ongoing recovery are:: able to take care of self and dog CMS Medicare.gov Compare Post Acute Care list provided to:: Patient Choice offered to / list presented to : Patient  Expected Discharge Plan and Services Expected Discharge Plan: [place] In-house Referral: Clinical Social Work   Post Acute Care Choice: [place] Living arrangements for the past 2 months: [place]                                      Prior Living Arrangements/Services Living arrangements for the past 2 months: Single Family Home Lives with:: Self Patient language and need for interpreter reviewed:: Yes Do you feel safe going back to the place where you live?: Yes      Need for Family Participation in Patient Care: Yes (Comment) Care giver support system in place?: Yes (comment) Current home services: Other (comment) (none) Criminal Activity/Legal Involvement Pertinent to Current Situation/Hospitalization: No - Comment as needed  Activities of Daily Living Home Assistive Devices/Equipment: None, CBG Meter ADL Screening (condition at time of admission) Patient's cognitive ability adequate to  safely complete daily activities?: Yes Is the patient deaf or have difficulty hearing?: No Does the patient have difficulty seeing, even when wearing glasses/contacts?: No Does the patient have difficulty concentrating, remembering, or making decisions?: No Patient able to express need for assistance with ADLs?: Yes Does the patient have difficulty dressing or bathing?: Yes Independently performs ADLs?: Yes (appropriate for developmental age) Does the patient have difficulty walking or climbing stairs?: Yes Weakness of Legs: None Weakness of Arms/Hands: None  Permission Sought/Granted Permission sought to share information with : Family Supports Permission granted to share information with : Yes, Verbal Permission Granted  Share Information with NAME: niece [name]  Permission granted to share info w AGENCY: SNF        Emotional Assessment Appearance:: Appears stated age Attitude/Demeanor/Rapport: Engaged Affect (typically observed): Appropriate, Pleasant Orientation: : Oriented to Self, Oriented to Place, Oriented to  Time, Oriented to Situation Alcohol / Substance Use: Not Applicable Psych Involvement: No (comment)  Admission diagnosis:  Liver failure ([place]) [K72.90] Pleural effusion [J90] S/P thoracentesis [Z98.890] Cirrhosis of liver with ascites, unspecified hepatic cirrhosis type ([place]) [K74.60, R18.8] Patient Active Problem List   Diagnosis Date Noted   Malnutrition of moderate degree 05/01/2021   Acute respiratory failure with hypoxia ([place]) 04/29/2021   Portal hypertension ([place]) 04/28/2021   Ascites 04/28/2021   Benign essential HTN 04/28/2021   Hx of esophageal varices 04/28/2021   Liver cirrhosis secondary to NASH ([place]) 04/27/2021  Pleural effusion, right    PCP:  Mayra Neer, MD Pharmacy:   [facility] [place], [place] [address] Phone: [phone]  Fax: [phone]     Social Determinants of Health (SDOH) Interventions    Readmission Risk Interventions No flowsheet data found.

## 2021-05-07 NOTE — Progress Notes (Signed)
Pharmacy Heparin Induced Thrombocytopenia (HIT) Note:  Courtney Braun is an 71 y.o. female being evaluated for HIT. Heparin was started 2/14 for portal vein thrombosis, and baseline platelets were 114 on 2/11.   HIT labs were ordered on 2/17 when platelets dropped to 55.  Auto-populate labs:  Heparin Induced Plt Ab  Date/Time Value Ref Range Status  05/05/2021 10:57 AM 0.181 0.000 - 0.400 OD Final    Comment:    (NOTE) Performed At: [facility] [place], [place] [id number] [place] MD QM:[id number]     CALCULATE SCORE:  4Ts (see the HIT Algorithm) Score  Thrombocytopenia 2  Timing 0  Thrombosis 0  Other causes of thrombocytopenia 2  Total 4     Recommendations (A or B) are based on available lab results (HIT antibody and/or SRA) and the HIT algorithm    A. HIT antibody result available  2/17: HIPA <0.6  HIT ruled out   No SRA needed No allergy documentation needed  Plan (Discussed with provider) Labs ordered:  SRA not needed  Heparin allergy:  No allergy documentation needed. Anticoagulation plans:  Continue current anticoagulation with Eliquis   Donald Pore 05/07/2021, 2:12 PM

## 2021-05-07 NOTE — Plan of Care (Signed)
°  Problem: Education: Goal: Knowledge of General Education information will improve Description: Including pain rating scale, medication(s)/side effects and non-pharmacologic comfort measures Outcome: Progressing   Problem: Health Behavior/Discharge Planning: Goal: Ability to manage health-related needs will improve Outcome: Progressing   Problem: Clinical Measurements: Goal: Ability to maintain clinical measurements within normal limits will improve Outcome: Progressing Goal: Will remain free from infection Outcome: Progressing Goal: Diagnostic test results will improve Outcome: Progressing Goal: Respiratory complications will improve Outcome: Progressing Goal: Cardiovascular complication will be avoided Outcome: Progressing   Problem: Clinical Measurements: Goal: Will remain free from infection Outcome: Progressing   Problem: Clinical Measurements: Goal: Diagnostic test results will improve Outcome: Progressing   Problem: Clinical Measurements: Goal: Respiratory complications will improve Outcome: Progressing   Problem: Clinical Measurements: Goal: Cardiovascular complication will be avoided Outcome: Progressing

## 2021-05-08 LAB — COMPREHENSIVE METABOLIC PANEL
ALT: 19 U/L (ref 0–44)
AST: 37 U/L (ref 15–41)
Albumin: 3.2 g/dL — ABNORMAL LOW (ref 3.5–5.0)
Alkaline Phosphatase: 55 U/L (ref 38–126)
Anion gap: 7 (ref 5–15)
BUN: 20 mg/dL (ref 8–23)
CO2: 33 mmol/L — ABNORMAL HIGH (ref 22–32)
Calcium: 8.6 mg/dL — ABNORMAL LOW (ref 8.9–10.3)
Chloride: 97 mmol/L — ABNORMAL LOW (ref 98–111)
Creatinine, Ser: 0.83 mg/dL (ref 0.44–1.00)
GFR, Estimated: >60 mL/min (ref >60)
Glucose, Bld: 122 mg/dL — ABNORMAL HIGH (ref 70–99)
Potassium: 3.8 mmol/L (ref 3.5–5.1)
Sodium: 137 mmol/L (ref 135–145)
Total Bilirubin: 2.6 mg/dL — ABNORMAL HIGH (ref 0.3–1.2)
Total Protein: 5.9 g/dL — ABNORMAL LOW (ref 6.5–8.1)

## 2021-05-08 LAB — CBC
HCT: 26.8 % — ABNORMAL LOW (ref 36.0–46.0)
Hemoglobin: 8.7 g/dL — ABNORMAL LOW (ref 12.0–15.0)
MCH: 31.1 pg (ref 26.0–34.0)
MCHC: 32.5 g/dL (ref 30.0–36.0)
MCV: 95.7 fL (ref 80.0–100.0)
Platelets: 47 10*3/uL — ABNORMAL LOW (ref 150–400)
RBC: 2.8 MIL/uL — ABNORMAL LOW (ref 3.87–5.11)
RDW: 15.9 % — ABNORMAL HIGH (ref 11.5–15.5)
WBC: 5.6 10*3/uL (ref 4.0–10.5)
nRBC: 0 % (ref 0.0–0.2)

## 2021-05-08 LAB — GLUCOSE, CAPILLARY
Glucose-Capillary: 114 mg/dL — ABNORMAL HIGH (ref 70–99)
Glucose-Capillary: 175 mg/dL — ABNORMAL HIGH (ref 70–99)
Glucose-Capillary: 180 mg/dL — ABNORMAL HIGH (ref 70–99)
Glucose-Capillary: 142 mg/dL — ABNORMAL HIGH (ref 70–99)

## 2021-05-08 MED ORDER — FUROSEMIDE 40 MG PO TABS
40.0000 mg | ORAL_TABLET | Freq: Every day | ORAL | Status: DC
  Administered 2021-05-08 – 2021-05-10 (×3): 40 mg via ORAL
  Filled 2021-05-08 (×3): qty 1

## 2021-05-08 NOTE — Progress Notes (Signed)
Speech Language Pathology Treatment: Dysphagia  Patient Details Name: Courtney Braun MRN: [id number] DOB: [birth year] Today's Date: 05/08/2021 Time: 1010-1018 SLP Time Calculation (min) (ACUTE ONLY): 8 min  Assessment / Plan / Recommendation Clinical Impression  Pt encountered sitting up after eating 90% breakfast without complaints. Regular/thin textures observed with swift oral phase and unremarkable pharyngeally from observation. She is independent with self feeding. She endorses a remote history of GER but not recent and therapist reviewed general precautions. Continue regular/thin and no further ST needed.   HPI HPI: Patient is a 71 y.o. female with PMH: nonalcoholic cirrhosis secondary to NASH, esophageal varices bleeding 2018, HTN, IIDM, HLD, came with increasing SOB and increasing abdomen size. She went to her hepatologist at [facility] who increased her Lasix from 40 to 43m but her symptoms continued. She lost her son last year and has developed bilateral arm and finger tremors which she associates with the stress of her son's death. In ED, patient had tachycardia and HTN but no hypoxia; was found to be fluid overloaded and CT chest and abdomen showing large right sided pleural effusion as well as large ascites. Respiratory status worsened despite thoracentesis and patient required 15L HFNC. She was intubated on 2/11 and extubated on 2/16 at 9am to nasal cannula. She failed Yale swallow screen with nursing prompting order for speech swallow evaluation.      SLP Plan  All goals met;Discharge SLP treatment due to (comment)      Recommendations for follow up therapy are one component of a multi-disciplinary discharge planning process, led by the attending physician.  Recommendations may be updated based on patient status, additional functional criteria and insurance authorization.    Recommendations  Diet recommendations: Regular;Thin liquid Liquids provided via:  Cup;Straw Medication Administration: Whole meds with liquid Supervision: Patient able to self feed Compensations: Slow rate;Small sips/bites Postural Changes and/or Swallow Maneuvers: Seated upright 90 degrees                Oral Care Recommendations: Oral care BID Follow Up Recommendations: No SLP follow up Assistance recommended at discharge: None SLP Visit Diagnosis: Dysphagia, unspecified (R13.10) Plan: All goals met;Discharge SLP treatment due to (comment)           LHouston Siren 05/08/2021, 10:19 AM

## 2021-05-08 NOTE — Progress Notes (Addendum)
PROGRESS NOTE    Courtney Braun  VPX:[id number] DOB: [birth year] DOA: 04/27/2021 PCP: Courtney Neer, MD  Narrative71/f with a history of Courtney Braun cirrhosis complicated by portal hypertension, gastric varices,esophageal varices, chronic anemia and right hepatic hydrothorax, along with history of diabetes, hypertension, hyperlipidemia. Admitted on 2/9 with progressive abdominal distention and associated dyspnea ongoing decline for 1 year. -started on diuretics, she underwent a right thoracentesis on 2/10.   -Subsequently developed acute respiratory distress and healthcare associated pneumonia with worsening hypoxia , subsequently transferred to the ICU and intubated, repeat x-ray noted bilateral alveolar infiltrates especially in the right  -Treated with broad-spectrum antibiotics, pleural fluid was transudative, Gram stain negative -Interventional radiology was also consulted for TIPS eval-not recommended at this time, CT angio abdomen pelvis noted chronic portal vein thrombosis, hepatic hydrothorax, gastro renal shunting and bilateral patchy airspace opacities -Subsequently extubated 2/16 -Transferred to [name] service 2/18  Subjective: -Feels okay overall, no events overnight  Assessment & Plan:  Acute hypoxemic respiratory failure Hepatic Hydrothorax Healthcare associated pneumonia -Underwent thoracentesis 2/10-1.5 L drained, fluid was transudative, cultures negative -Intubated 2/11, extubated 2/16 -Diffuse bilateral pulmonary infiltrates on CT concerning for HCAP.  -Clinically improving, attempt to wean off O2 -Completed antibiotic course 2/17, now off, blood and trach aspirate cultures are negative -Clinically improving, diuresed with IV Lasix and oral Aldactone, she is 3.1 L negative, will switch to oral Lasix today -Echo with preserved EF, grade 2 diastolic dysfunction -Continue bronchodilators -Ambulate, increase activity,  -PT eval-SNF recommended, overall prognosis remains poor,  will need palliative care follow-up   Septic Shock -Resolved, weaned off antibiotics and pressors   Decompensated Nash cirrhosis.   With portal gastropathy, esophageal and gastric varices and hepatic hydrothorax. IR consulted  patient is not a candidate for TIPS except in case of life-threatening gastric variceal bleeding. Continue with medical management. - GI following, Continue to appreciate recommendations - continue diuresis with lasix and aldactone  -Started nadolol yesterday -Off octreotide -Overall prognosis is poor, palliative care follow-up recommended, DNR  Chronic portal vein thrombosis -Noted on imaging this admission, started on anticoagulation for this, was on IV heparin initially transitioned to Eliquis in ICU -ADDENDUM: Discussed with gastroenterology today, portal vein thrombosis is chronic and she is at high risk for bleeding with known gastric, esophageal varices, severe thrombocytopenia, we will discontinue Eliquis   History of hepatic encephalopathy - On lactulose at home, continued -Continue rifaximin   Anemia of Chronic disease -Stable, monitor hemoglobin periodically   Diabetes with hyperglycemia - SSI PRN -   DVT prophylaxis: Apixaban Code Status: Discussed CODE STATUS again, she is agreeable to DNR Family Communication: Discussed with patient in detail, no family at bedside Disposition Plan: SNF in 1 to 2 days  Consultants:  gastroenterology, IR, PCCM transfer  Procedures:   Antimicrobials:    Objective: Vitals:   05/08/21 0600 05/08/21 0820 05/08/21 0823 05/08/21 1147  BP:  (!) 134/59  (!) 145/114  Pulse:  78  67  Resp:  20  20  Temp:  98.2 F (36.8 C)  98.9 F (37.2 C)  TempSrc:  Oral  Oral  SpO2:  90% (!) 88% 98%  Weight: 112.8 kg     Height:        Intake/Output Summary (Last 24 hours) at 05/08/2021 1213 Last data filed at 05/08/2021 0821 Gross per 24 hour  Intake --  Output 1000 ml  Net -1000 ml   Filed Weights   05/06/21  0500 05/07/21 0500 05/08/21 0600  Weight: 111  kg 113.9 kg 112.8 kg    Examination:  General exam: Pleasant chronically ill female sitting up in bed, AAOx3, no distress HEENT: No JVD CVS: S1-S2, regular rate rhythm Lungs: Decreased breath sounds to bases otherwise clear Abdomen: Soft, nontender, bowel sounds present Extremities: Trace edema  Skin: No rashes Psychiatry:  Mood & affect appropriate.     Data Reviewed:   CBC: Recent Labs  Lab 05/04/21 1101 05/05/21 0603 05/06/21 0155 05/07/21 0150 05/08/21 0023  WBC 8.2 6.0 5.4 4.2 5.6  HGB 9.9* 9.5* 8.9* 8.9* 8.7*  HCT 31.0* 29.9* 27.9* 26.8* 26.8*  MCV 96.6 99.3 95.5 94.7 95.7  PLT 63* 55* 56* 42* 47*   Basic Metabolic Panel: Recent Labs  Lab 05/04/21 1101 05/05/21 0603 05/06/21 0155 05/07/21 0150 05/08/21 0023  NA 148* 144 138 136 137  K 4.8 5.0 3.6 3.4* 3.8  CL 107 105 98 95* 97*  CO2 32 33* 36* 35* 33*  GLUCOSE 168* 129* 102* 112* 122*  BUN 43* 36* 28* 22 20  CREATININE 0.89 0.80 0.81 0.68 0.83  CALCIUM 8.7* 8.3* 8.2* 8.2* 8.6*   GFR: Estimated Creatinine Clearance: 76.2 mL/min (by C-G formula based on SCr of 0.83 mg/dL). Liver Function Tests: Recent Labs  Lab 05/03/21 0656 05/05/21 0603 05/06/21 0155 05/07/21 0150 05/08/21 0023  AST 35 66* 33 32 37  ALT 25 20 21 18 19   ALKPHOS 63 56 57 48 55  BILITOT 2.2* 1.5* 1.2 1.8* 2.6*  PROT 6.5 5.6* 5.4* 5.6* 5.9*  ALBUMIN 2.8* 2.3* 2.1* 2.7* 3.2*   No results for input(s): LIPASE, AMYLASE in the last 168 hours. No results for input(s): AMMONIA in the last 168 hours. Coagulation Profile: Recent Labs  Lab 05/03/21 0656  INR 1.8*   Cardiac Enzymes: No results for input(s): CKTOTAL, CKMB, CKMBINDEX, TROPONINI in the last 168 hours. BNP (last 3 results) No results for input(s): PROBNP in the last 8760 hours. HbA1C: No results for input(s): HGBA1C in the last 72 hours. CBG: Recent Labs  Lab 05/07/21 1154 05/07/21 1632 05/07/21 2115 05/08/21 0818  05/08/21 1147  GLUCAP 193* 183* 152* 114* 175*   Lipid Profile: No results for input(s): CHOL, HDL, LDLCALC, TRIG, CHOLHDL, LDLDIRECT in the last 72 hours. Thyroid Function Tests: No results for input(s): TSH, T4TOTAL, FREET4, T3FREE, THYROIDAB in the last 72 hours. Anemia Panel: No results for input(s): VITAMINB12, FOLATE, FERRITIN, TIBC, IRON, RETICCTPCT in the last 72 hours. Urine analysis: No results found for: COLORURINE, APPEARANCEUR, LABSPEC, PHURINE, GLUCOSEU, HGBUR, BILIRUBINUR, KETONESUR, PROTEINUR, UROBILINOGEN, NITRITE, LEUKOCYTESUR Sepsis Labs: @LABRCNTIP (procalcitonin:4,lacticidven:4)  ) Recent Results (from the past 240 hour(s))  Culture, body fluid w Gram Stain-bottle     Status: None   Collection Time: 04/28/21 12:54 PM   Specimen: Pleura  Result Value Ref Range Status   Specimen Description PLEURAL FLUID RIGHT  Final   Special Requests NONE  Final   Culture   Final    NO GROWTH 5 DAYS Performed at [facility], [address], [place], [place] [address]    Report Status 05/03/2021 FINAL  Final  Gram stain     Status: None   Collection Time: 04/28/21 12:54 PM   Specimen: Pleura  Result Value Ref Range Status   Specimen Description PLEURAL FLUID RIGHT  Final   Special Requests NONE  Final   Gram Stain   Final    WBC PRESENT, PREDOMINANTLY MONONUCLEAR NO ORGANISMS SEEN CYTOSPIN SMEAR Performed at [facility], [place]  [address] [place], [place] [address]    Report Status 04/28/2021 FINAL  Final  MRSA Next Gen by PCR, Nasal     Status: Abnormal   Collection Time: 04/29/21  3:18 PM   Specimen: Nasal Mucosa; Nasal Swab  Result Value Ref Range Status   MRSA by PCR Next Gen DETECTED (A) NOT DETECTED Final    Comment: RESULT CALLED TO, READ BACK BY AND VERIFIED WITH: A,HAFNER @1800  04/29/21 EB (NOTE) The GeneXpert MRSA Assay (FDA approved for NASAL specimens only), is one component of a comprehensive MRSA colonization surveillance program. It is  not intended to diagnose MRSA infection nor to guide or monitor treatment for MRSA infections. Test performance is not FDA approved in patients less than 21 years old. Performed at [facility], [place] [address] [place], [place] [address]   Culture, blood (routine x 2)     Status: None   Collection Time: 04/29/21  3:27 PM   Specimen: BLOOD  Result Value Ref Range Status   Specimen Description BLOOD SITE NOT SPECIFIED  Final   Special Requests   Final    BOTTLES DRAWN AEROBIC ONLY Blood Culture results may not be optimal due to an inadequate volume of blood received in culture bottles   Culture   Final    NO GROWTH 5 DAYS Performed at [facility], [place] [address], [place], [place] [address]    Report Status 05/04/2021 FINAL  Final  Culture, blood (routine x 2)     Status: None   Collection Time: 04/29/21  3:27 PM   Specimen: BLOOD  Result Value Ref Range Status   Specimen Description BLOOD SITE NOT SPECIFIED  Final   Special Requests   Final    BOTTLES DRAWN AEROBIC ONLY Blood Culture results may not be optimal due to an inadequate volume of blood received in culture bottles   Culture   Final    NO GROWTH 5 DAYS Performed at [facility], [place] [address], [place], [place] [address]    Report Status 05/04/2021 FINAL  Final  Culture, Respiratory w Gram Stain     Status: None   Collection Time: 04/29/21  3:32 PM   Specimen: Tracheal Aspirate; Respiratory  Result Value Ref Range Status   Specimen Description TRACHEAL ASPIRATE  Final   Special Requests NONE  Final   Gram Stain   Final    FEW WBC PRESENT,BOTH PMN AND MONONUCLEAR NO ORGANISMS SEEN    Culture   Final    Normal respiratory flora-no Staph aureus or Pseudomonas seen Performed at [facility], [address] [place], [place] [address]    Report Status 05/01/2021 FINAL  Final     Radiology Studies: No results found.   Scheduled Meds:  apixaban  10 mg Oral BID   Followed by   Derrill Memo ON  05/12/2021] apixaban  5 mg Oral BID   feeding supplement  237 mL Oral BID BM   furosemide  40 mg Oral Daily   Gerhardt's butt cream   Topical TID   insulin aspart  1-3 Units Subcutaneous TID AC & HS   multivitamin with minerals  1 tablet Oral Daily   nadolol  20 mg Oral Daily   pantoprazole  40 mg Oral Daily   rifaximin  550 mg Oral BID   simvastatin  40 mg Oral QHS   sodium chloride flush  3 mL Intravenous Q12H   spironolactone  100 mg Oral Daily   Continuous  Infusions:  sodium chloride Stopped (05/05/21 1416)   sodium chloride       LOS: 11 days    Time spent: 58mn    PDomenic Polite MD [facility]   05/08/2021, 12:13 PM

## 2021-05-08 NOTE — TOC Progression Note (Addendum)
Transition of Care [facility]) - Progression Note    Patient Details  Name: Courtney Braun MRN: [id number] Date of Birth: 1951/02/17  Transition of Care [facility]) CM/SW [place], [place] Phone Number: 05/08/2021, 1:43 PM  Clinical Narrative:     CSW met with pt and provided SNF offers. Pt chooses [name]. Pt has had 2 covid vaccinations. She states her niece [name] would be able to complete paper work at [employer] on discharge.   CSW contacted Amado Coe at [employer]; she stated they can accept "pending availability when ready."   Expected Discharge Plan: [place] Barriers to Discharge: Continued Medical Work up, SNF Pending bed offer  Expected Discharge Plan and Services Expected Discharge Plan: [place] In-house Referral: Clinical Social Work   Post Acute Care Choice: [place] Living arrangements for the past 2 months: Single Family Home                                       Social Determinants of Health (SDOH) Interventions    Readmission Risk Interventions No flowsheet data found.

## 2021-05-08 NOTE — Progress Notes (Signed)
[facility] Gastroenterology Progress Note  Courtney Braun 71 y.o. 04-02-1950  CC: Decompensated cirrhosis   Subjective: Patient resting comfortably in bed.  States she is feeling much better this morning.  Denies nausea/vomiting.  Denies melena/hematochezia.  Denies abdominal pain.  Having at least 2-3 BMs per day.  ROS : Review of Systems  Gastrointestinal:  Negative for abdominal pain, blood in stool, constipation, diarrhea, heartburn, melena, nausea and vomiting.  Genitourinary:  Negative for dysuria and urgency.     Objective: Vital signs in last 24 hours: Vitals:   05/08/21 0402 05/08/21 0820  BP: 135/60 (!) 134/59  Pulse: 78 78  Resp: 20 20  Temp: 98.3 F (36.8 C) 98.2 F (36.8 C)  SpO2: 94% 90%    Physical Exam:  General:  Alert, cooperative, no distress  Head:  Normocephalic, without obvious abnormality, atraumatic  Eyes:  Anicteric sclera, EOM's intact  Lungs:   Clear to auscultation bilaterally, respirations unlabored  Heart:  Regular rate and rhythm, S1, S2 normal  Abdomen:   Soft, non-tender, bowel sounds active all four quadrants,  no masses,     Lab Results: Recent Labs    05/07/21 0150 05/08/21 0023  NA 136 137  K 3.4* 3.8  CL 95* 97*  CO2 35* 33*  GLUCOSE 112* 122*  BUN 22 20  CREATININE 0.68 0.83  CALCIUM 8.2* 8.6*   Recent Labs    05/07/21 0150 05/08/21 0023  AST 32 37  ALT 18 19  ALKPHOS 48 55  BILITOT 1.8* 2.6*  PROT 5.6* 5.9*  ALBUMIN 2.7* 3.2*   Recent Labs    05/07/21 0150 05/08/21 0023  WBC 4.2 5.6  HGB 8.9* 8.7*  HCT 26.8* 26.8*  MCV 94.7 95.7  PLT 42* 47*   No results for input(s): LABPROT, INR in the last 72 hours.    Assessment - Decompensated Courtney Braun cirrhosis complicated by varices and hepatic hydrothorax. MELD 15 2/20  - Underwent thoracentesis 2/10-1.5 L drained, fluid was transudative, cultures negative  -Gastric varices with splenorenal shunt.  Portal vein thrombosis.   -Respiratory failure.  Extubated now -  thrombocytopenia  Plan: Continue lactulose 10 G once daily, titrate 2-3 bowel movements Continue Lasix 40 mg and Aldactone 100 mg daily.  Stable renal function Continue rifaximin 550 mg twice daily Continue pantoprazole 40 mg daily Patient was transitioned to Eliquis 2/18.  With history of varices, ideally patient would not be anticoagulated. Though complicated situation at this time. Patient is currently stable. Continue to optimize medications including diuretics.  Patient will need close GI follow-up. Primary GI doctor is Dr. Watt Climes. [facility] will sign off. Please contact us if we can be of any further assistance during this hospital stay.   Garnette Scheuermann PA-C 05/08/2021, 10:05 AM  Contact #  [phone]

## 2021-05-09 DIAGNOSIS — I81 Portal vein thrombosis: Secondary | ICD-10-CM

## 2021-05-09 DIAGNOSIS — D696 Thrombocytopenia, unspecified: Secondary | ICD-10-CM

## 2021-05-09 DIAGNOSIS — E118 Type 2 diabetes mellitus with unspecified complications: Secondary | ICD-10-CM

## 2021-05-09 DIAGNOSIS — D638 Anemia in other chronic diseases classified elsewhere: Secondary | ICD-10-CM

## 2021-05-09 DIAGNOSIS — A419 Sepsis, unspecified organism: Secondary | ICD-10-CM | POA: Diagnosis present

## 2021-05-09 DIAGNOSIS — K7682 Hepatic encephalopathy: Secondary | ICD-10-CM

## 2021-05-09 LAB — COMPREHENSIVE METABOLIC PANEL
ALT: 26 U/L (ref 0–44)
AST: 45 U/L — ABNORMAL HIGH (ref 15–41)
Albumin: 3 g/dL — ABNORMAL LOW (ref 3.5–5.0)
Alkaline Phosphatase: 66 U/L (ref 38–126)
Anion gap: 4 — ABNORMAL LOW (ref 5–15)
BUN: 19 mg/dL (ref 8–23)
CO2: 34 mmol/L — ABNORMAL HIGH (ref 22–32)
Calcium: 8.5 mg/dL — ABNORMAL LOW (ref 8.9–10.3)
Chloride: 99 mmol/L (ref 98–111)
Creatinine, Ser: 0.78 mg/dL (ref 0.44–1.00)
GFR, Estimated: >60 mL/min (ref >60)
Glucose, Bld: 112 mg/dL — ABNORMAL HIGH (ref 70–99)
Potassium: 4 mmol/L (ref 3.5–5.1)
Sodium: 137 mmol/L (ref 135–145)
Total Bilirubin: 2.9 mg/dL — ABNORMAL HIGH (ref 0.3–1.2)
Total Protein: 5.9 g/dL — ABNORMAL LOW (ref 6.5–8.1)

## 2021-05-09 LAB — GLUCOSE, CAPILLARY
Glucose-Capillary: 89 mg/dL (ref 70–99)
Glucose-Capillary: 179 mg/dL — ABNORMAL HIGH (ref 70–99)
Glucose-Capillary: 157 mg/dL — ABNORMAL HIGH (ref 70–99)
Glucose-Capillary: 138 mg/dL — ABNORMAL HIGH (ref 70–99)

## 2021-05-09 LAB — CBC
HCT: 27.8 % — ABNORMAL LOW (ref 36.0–46.0)
Hemoglobin: 9.1 g/dL — ABNORMAL LOW (ref 12.0–15.0)
MCH: 31.4 pg (ref 26.0–34.0)
MCHC: 32.7 g/dL (ref 30.0–36.0)
MCV: 95.9 fL (ref 80.0–100.0)
Platelets: 49 10*3/uL — ABNORMAL LOW (ref 150–400)
RBC: 2.9 MIL/uL — ABNORMAL LOW (ref 3.87–5.11)
RDW: 16.6 % — ABNORMAL HIGH (ref 11.5–15.5)
WBC: 7.5 10*3/uL (ref 4.0–10.5)
nRBC: 0 % (ref 0.0–0.2)

## 2021-05-09 MED ORDER — LOPERAMIDE HCL 2 MG PO CAPS
2.0000 mg | ORAL_CAPSULE | ORAL | Status: DC | PRN
  Administered 2021-05-09 – 2021-05-10 (×2): 2 mg via ORAL
  Filled 2021-05-09 (×2): qty 1

## 2021-05-09 NOTE — Plan of Care (Signed)

## 2021-05-09 NOTE — Care Management Important Message (Signed)
Important Message  Patient Details  Name: Courtney Braun MRN: [id number] Date of Birth: 11/03/1950   Medicare Important Message Given:  Yes     Orbie Pyo 05/09/2021, 9:24 AM

## 2021-05-09 NOTE — Assessment & Plan Note (Signed)
Treated with pressors and antibiotics in ICU, resolved

## 2021-05-09 NOTE — Assessment & Plan Note (Signed)
Chronic secondary to liver cirrhosis, portal hypertension

## 2021-05-09 NOTE — Assessment & Plan Note (Signed)
History of hepatic encephalopathy -Mental status remains stable, continue lactulose and rifaximin, decrease lactulose dose for diarrhea

## 2021-05-09 NOTE — Assessment & Plan Note (Signed)
Stable, monitor.  °

## 2021-05-09 NOTE — Assessment & Plan Note (Signed)
Decompensated Nash cirrhosis.  With portal gastropathy, esophageal and gastric varices and hepatic hydrothorax. IR consulted  patient is not a candidate for TIPS except in case of life-threatening gastric variceal bleeding. Continue with medical management. - GI following, Continue to appreciate recommendations - continue diuresis with lasix and aldactone  -Off octreotide, started nadolol -Overall prognosis is poor, palliative care follow-up recommended, discussed CODE STATUS again with the patient, now DNR

## 2021-05-09 NOTE — Assessment & Plan Note (Addendum)
Chronic portal vein thrombosis -Noted on imaging this admission, started on anticoagulation for this, was on IV heparin initially transitioned to Eliquis in ICU -Discussed with gastroenterology 2/20, portal vein thrombosis is chronic as noted on imaging and at the same time she is at high risk of bleeding with known esophageal or gastric varices, coagulopathy and severe thrombocytopenia, her bleeding risk remains very high, we discontinued anticoagulation yesterday

## 2021-05-09 NOTE — TOC Progression Note (Signed)
Transition of Care [facility]) - Progression Note    Patient Details  Name: Courtney Braun MRN: [id number] Date of Birth: 1950/12/21  Transition of Care [facility]) CM/SW [place], LCSW Phone Number: 05/09/2021, 5:03 PM  Clinical Narrative:    [facility] has received insurance approval and can accept patient tomorrow. Niece to do paperwork at 10am. CSW requested COVID test from MD.    Expected Discharge Plan: [place] Barriers to Discharge: Continued Medical Work up, Ship broker  Expected Discharge Plan and Services Expected Discharge Plan: [place] In-house Referral: Clinical Social Work   Post Acute Care Choice: [place] Living arrangements for the past 2 months: Single Family Home                                       Social Determinants of Health (SDOH) Interventions    Readmission Risk Interventions No flowsheet data found.

## 2021-05-09 NOTE — Progress Notes (Signed)
Occupational Therapy Treatment Patient Details Name: Courtney Braun MRN: [id number] DOB: [birth year] Today's Date: 05/09/2021   History of present illness Courtney Braun is a 71 y.o. female who presented with increased SOB and edema. CT chest/abdomen showed large R sided pleural effusion, ascites, abd liver cirrhosis with portal hypertension. Underwent thoracentesis on 2/10. Due to acute hypoxic event in setting of multilobar PNA, pt required intubation on 2/11 and extubated on 2/16. PMH: NASH cirrhosis, esophageal varices bleeding 2018, HTN, IIDM, HLD   OT comments  Pt making excellent progress towards established OT goals. Session focused on EOB bathing/dressing tasks with Setup for UB ADLs and Min-ModA  for LB ADLs. Pt able to demo basic transfers using RW with no L lateral lean and much improved BUE tremors. Pt hopeful to return to independence quickly. Anticipate good progress with SNF rehab at [place].   Recommendations for follow up therapy are one component of a multi-disciplinary discharge planning process, led by the attending physician.  Recommendations may be updated based on patient status, additional functional criteria and insurance authorization.    Follow Up Recommendations  Skilled nursing-short term rehab (<3 hours/day)    Assistance Recommended at Discharge Intermittent Supervision/Assistance  Patient can return home with the following  A lot of help with walking and/or transfers;A lot of help with bathing/dressing/bathroom;Assistance with cooking/housework   Equipment Recommendations  BSC/3in1;Other (comment) (Rolling walker)    Recommendations for Other Services      Precautions / Restrictions Precautions Precautions: Fall;Other (comment) Precaution Comments: monitor O2 (does not wear at baseline); tremulous Restrictions Weight Bearing Restrictions: No       Mobility Bed Mobility Overal bed mobility: Needs Assistance Bed Mobility: Supine to Sit     Supine  to sit: Supervision, HOB elevated     General bed mobility comments: use of bedrails    Transfers Overall transfer level: Needs assistance Equipment used: Rolling walker (2 wheels), 1 person hand held assist Transfers: Sit to/from Stand, Bed to chair/wheelchair/BSC Sit to Stand: Min assist     Step pivot transfers: Min assist     General transfer comment: initially min guard to stand from bed, increasing to Min A for posterior region; Min A for DME manuevering to chair with RW; no L lateral lean noted     Balance Overall balance assessment: Needs assistance Sitting-balance support: No upper extremity supported, Feet supported Sitting balance-Leahy Scale: Good     Standing balance support: Bilateral upper extremity supported, During functional activity Standing balance-Leahy Scale: Poor                             ADL either performed or assessed with clinical judgement   ADL Overall ADL's : Needs assistance/impaired     Grooming: Set up;Sitting;Wash/dry face   Upper Body Bathing: Set up;Sitting   Lower Body Bathing: Moderate assistance;Sit to/from stand Lower Body Bathing Details (indicate cue type and reason): assist for peri region, lower LEs/feet Upper Body Dressing : Set up;Sitting   Lower Body Dressing: Minimal assistance;Sit to/from stand Lower Body Dressing Details (indicate cue type and reason): able to don socks by crossing LEs; L LE easier than R LE Toilet Transfer: Minimal assistance;Stand-pivot;BSC/3in1;Rolling walker (2 wheels)             General ADL Comments: Focus on EOB ADLs with NT collaboration, much improved tremors, upright posture in standing and improving endurance    Extremity/Trunk Assessment Upper Extremity Assessment Upper Extremity Assessment:  RUE deficits/detail;LUE deficits/detail RUE Deficits / Details: much improved tremors today; baseline tremors LUE Deficits / Details: much improved tremors today; baseline tremors    Lower Extremity Assessment Lower Extremity Assessment: Defer to PT evaluation        Vision   Vision Assessment?: No apparent visual deficits   Perception     Praxis      Cognition Arousal/Alertness: Awake/alert Behavior During Therapy: WFL for tasks assessed/performed Overall Cognitive Status: Within Functional Limits for tasks assessed                                          Exercises      Shoulder Instructions       General Comments SpO2 91% with poor pleth signal; on 2 L O2; no SOB noted    Pertinent Vitals/ Pain       Pain Assessment Pain Assessment: No/denies pain  Home Living                                          Prior Functioning/Environment              Frequency  Min 2X/week        Progress Toward Goals  OT Goals(current goals can now be found in the care plan section)  Progress towards OT goals: Progressing toward goals  Acute Rehab OT Goals Patient Stated Goal: get better soon, return to independence OT Goal Formulation: With patient Time For Goal Achievement: 05/19/21 Potential to Achieve Goals: Good ADL Goals Pt Will Perform Lower Body Bathing: with min assist;sitting/lateral leans;sit to/from stand Pt Will Transfer to Toilet: with min guard assist;stand pivot transfer;bedside commode Additional ADL Goal #1: Pt to increase activity tolerance >7 min during ADLs/functional tasks Additional ADL Goal #2: Pt to implement at least 2 energy conservation strategies during ADLs/mobility  Plan Discharge plan remains appropriate    Co-evaluation                 AM-PAC OT "6 Clicks" Daily Activity     Outcome Measure   Help from another person eating meals?: A Little Help from another person taking care of personal grooming?: A Little Help from another person toileting, which includes using toliet, bedpan, or urinal?: A Lot Help from another person bathing (including washing, rinsing,  drying)?: A Lot Help from another person to put on and taking off regular upper body clothing?: A Lot Help from another person to put on and taking off regular lower body clothing?: A Lot 6 Click Score: 14    End of Session Equipment Utilized During Treatment: Rolling walker (2 wheels);Oxygen  OT Visit Diagnosis: Unsteadiness on feet (R26.81);Other abnormalities of gait and mobility (R26.89);Muscle weakness (generalized) (M62.81)   Activity Tolerance Patient tolerated treatment well   Patient Left in chair;with call bell/phone within reach;with chair alarm set;with nursing/sitter in room   Nurse Communication          Time: 5329-9242 OT Time Calculation (min): 25 min  Charges: OT General Charges $OT Visit: 1 Visit OT Treatments $Self Care/Home Management : 23-37 mins  Malachy Chamber, OTR/L Acute Rehab Services Office: [phone]   Layla Maw 05/09/2021, 10:28 AM

## 2021-05-09 NOTE — Progress Notes (Signed)
Physical Therapy Treatment Patient Details Name: JULIETA ROGALSKI MRN: [id number] DOB: 05/07/1950 Today's Date: 05/09/2021   History of Present Illness ADASHA BOEHME is a 71 y.o. female who presented with increased SOB and edema. CT chest/abdomen showed large R sided pleural effusion, ascites, abd liver cirrhosis with portal hypertension. Underwent thoracentesis on 2/10. Due to acute hypoxic event in setting of multilobar PNA, pt required intubation on 2/11 and extubated on 2/16. PMH: NASH cirrhosis, esophageal varices bleeding 2018, HTN, IIDM, HLD    PT Comments    Pt received in chair, oriented and agreeable to therapy session and with good participation and tolerance for transfer and gait training this session. Pt SpO2 WFL on RA at rest but desat to 84% with exertion, replaced on 0.5L O2 [place] and WFL, she may benefit from [employer] ordered to room. Pt needing consistent minA for stability/balance using RW for short distances and quick to fatigue, with minor LOB but able to correct with external assist. Pt HR WFL, BP reading 122/48 (68) seated in chair post-exertion, pt denies dizziness. Pt continues to benefit from PT services to progress toward functional mobility goals, continue to recommend SNF due to pt significant fatigue, slow gait speed <0.4 m/s all indicating high fall risk and pt notably decreased activity tolerance with minimal exertion.  Recommendations for follow up therapy are one component of a multi-disciplinary discharge planning process, led by the attending physician.  Recommendations may be updated based on patient status, additional functional criteria and insurance authorization.  Follow Up Recommendations  Skilled nursing-short term rehab (<3 hours/day)     Assistance Recommended at Discharge Frequent or constant Supervision/Assistance  Patient can return home with the following A lot of help with bathing/dressing/bathroom;Assistance with  cooking/housework;Assistance with feeding;Direct supervision/assist for medications management;Direct supervision/assist for financial management;Assist for transportation;A little help with walking and/or transfers;Help with stairs or ramp for entrance   Equipment Recommendations  Rolling walker (2 wheels)    Recommendations for Other Services       Precautions / Restrictions Precautions Precautions: Fall;Other (comment) Precaution Comments: monitor O2 (does not wear at baseline); tremulous Restrictions Weight Bearing Restrictions: No     Mobility  Bed Mobility       General bed mobility comments: pt received/remained up in recliner    Transfers Overall transfer level: Needs assistance Equipment used: Rolling walker (2 wheels), 1 person hand held assist Transfers: Sit to/from Stand, Bed to chair/wheelchair/BSC Sit to Stand: Min assist           General transfer comment: pt needs cues for hand placement/improved technique for standing and fair safety awareness when sitting, decreased stability upon standing and decreased eccentric control to sit needs consistent minA for lift/sit assist.    Ambulation/Gait Ambulation/Gait assistance: Min assist Gait Distance (Feet): 18 Feet (x2 with seated break in bathroom) Assistive device: Rolling walker (2 wheels) Gait Pattern/deviations: Step-to pattern, Step-through pattern, Trunk flexed, Drifts right/left, Decreased stride length Gait velocity: <0.2 m/s     General Gait Details: decreased B foot clearance, minor LOB to R with fatigue returning from bathroom and needs mod cues for posture, pursed-lip breathing and activity pacing. SpO2 desat to 84% on RA with exertion, improves to 88% within 30 seconds of rest on RA and improves to 90% and greater within 1-2 minutes of pursed-lip breathing and replaced on 0.5L O2 [place].      Balance Overall balance assessment: Needs assistance Sitting-balance support: No upper extremity  supported, Feet supported Sitting balance-Leahy Scale: Good  Standing balance support: Bilateral upper extremity supported, Reliant on assistive device for balance Standing balance-Leahy Scale: Poor Standing balance comment: R LOB with dynamic tasks using RW, needs minA to correct              Cognition Arousal/Alertness: Awake/alert Behavior During Therapy: WFL for tasks assessed/performed Overall Cognitive Status: Within Functional Limits for tasks assessed         General Comments: A&O x4, needs safety cues with transfers/RW mgmt        Exercises Other Exercises Other Exercises: seated BLE AROM: hip flexion, LAQ x10 reps ea x2 sets Other Exercises: STS x 3 reps Other Exercises: pursed lip breathing x10 reps    General Comments General comments (skin integrity, edema, etc.): pt received on 0.5L [place], placed on RA and SpO2 90-94% at rest, with exertion desat to 84% and replaced on 0.5L [place] improves to 90% and greater within 1 minute. RN notified she may benefit from Incentive Spirometer to assist with weaning off O2.      Pertinent Vitals/Pain Pain Assessment Pain Assessment: Faces Faces Pain Scale: Hurts a little bit Pain Location: peri area skin breakdown Pain Descriptors / Indicators: Discomfort Pain Intervention(s): Limited activity within patient's tolerance, Monitored during session, Repositioned, Other (comment) (NT notified pt may need cream for area and needs new purewick)           PT Goals (current goals can now be found in the care plan section) Acute Rehab PT Goals Patient Stated Goal: to get stronger PT Goal Formulation: With patient Time For Goal Achievement: 05/19/21 Progress towards PT goals: Progressing toward goals    Frequency    Min 2X/week      PT Plan Current plan remains appropriate       AM-PAC PT "6 Clicks" Mobility   Outcome Measure  Help needed turning from your back to your side while in a flat bed without using  bedrails?: A Little Help needed moving from lying on your back to sitting on the side of a flat bed without using bedrails?: A Lot Help needed moving to and from a bed to a chair (including a wheelchair)?: A Little Help needed standing up from a chair using your arms (e.g., wheelchair or bedside chair)?: A Little Help needed to walk in hospital room?: A Lot Help needed climbing 3-5 steps with a railing? : Total 6 Click Score: 14    End of Session Equipment Utilized During Treatment: Gait belt;Oxygen Activity Tolerance: Patient tolerated treatment well Patient left: in chair;with call bell/phone within reach;with chair alarm set Nurse Communication: Mobility status;Other (comment) (pt could benefit from incentive spirometer, did desat with exertion on RA otherwise doing well on 0.5L O2 [place]) PT Visit Diagnosis: Unsteadiness on feet (R26.81);Other abnormalities of gait and mobility (R26.89);Muscle weakness (generalized) (M62.81);Difficulty in walking, not elsewhere classified (R26.2);Other symptoms and signs involving the nervous system (R29.898)     Time: 2800-3491 PT Time Calculation (min) (ACUTE ONLY): 29 min  Charges:  $Gait Training: 8-22 mins $Therapeutic Exercise: 8-22 mins                     Nemiah Kissner P., PTA Acute Rehabilitation Services Pager: [phone] Office: [place] 05/09/2021, 11:32 AM

## 2021-05-09 NOTE — Assessment & Plan Note (Signed)
CBG stable on sliding scale insulin

## 2021-05-09 NOTE — Assessment & Plan Note (Signed)
Hepatic Hydrothorax [facility] associated pneumonia -Underwent thoracentesis 2/10-1.5 L drained, fluid was transudative, cultures negative -Intubated 2/11, extubated 2/16 -Diffuse bilateral pulmonary infiltrates on CT concerning for HCAP.  -Clinically improving, attempt to wean off O2 -Completed antibiotic course 2/17, now off, blood and trach aspirate cultures are negative -Clinically improving, diuresed with IV Lasix and oral Aldactone, she is 4 L negative, transitioned to oral Lasix -Echo with preserved EF, grade 2 diastolic dysfunction -Continue bronchodilators -Attempt to wean off O2, increase activity as tolerated -PT eval-SNF recommended,  overall prognosis remains poor, will need palliative care follow-up, TOC following, discharge planning

## 2021-05-09 NOTE — Progress Notes (Addendum)
PROGRESS NOTE    Courtney Braun  IOX:[id number] DOB: Oct 20, 1950 DOA: 04/27/2021 PCP: Courtney Neer, MD  Brief Narrative:71/f with a history of Courtney Braun cirrhosis complicated by portal hypertension, gastric varices,esophageal varices, chronic anemia and right hepatic hydrothorax, along with history of diabetes, hypertension, hyperlipidemia. Admitted on 2/9 with progressive abdominal distention and associated dyspnea ongoing decline for 1 year. -started on diuretics, she underwent a right thoracentesis on 2/10.   -Subsequently developed acute respiratory distress and healthcare associated pneumonia with worsening hypoxia , subsequently transferred to the ICU and intubated, repeat x-ray noted bilateral alveolar infiltrates especially in the right  -Treated with broad-spectrum antibiotics, pleural fluid was transudative, Gram stain negative -Interventional radiology was also consulted for TIPS eval-not recommended at this time, CT angio abdomen pelvis noted chronic portal vein thrombosis, hepatic hydrothorax, gastro renal shunting and bilateral patchy airspace opacities -Subsequently extubated 2/16 -Transferred to [facility] service 2/18   Subjective: -Feels okay overall, no events overnight   Assessment and Plan: * Acute respiratory failure with hypoxia ([place]) Hepatic Hydrothorax Healthcare associated pneumonia -Underwent thoracentesis 2/10-1.5 L drained, fluid was transudative, cultures negative -Intubated 2/11, extubated 2/16 -Diffuse bilateral pulmonary infiltrates on CT concerning for HCAP.  -Clinically improving, attempt to wean off O2 -Completed antibiotic course 2/17, now off, blood and trach aspirate cultures are negative -Clinically improving, diuresed with IV Lasix and oral Aldactone, she is 4 L negative, transitioned to oral Lasix -Echo with preserved EF, grade 2 diastolic dysfunction -Continue bronchodilators -Attempt to wean off O2, increase activity as tolerated -PT eval-SNF  recommended,  overall prognosis remains poor, will need palliative care follow-up, TOC following, discharge planning    Septic shock ([place])- (present on admission) Treated with pressors and antibiotics in ICU, resolved  Liver cirrhosis secondary to NASH [facility])- (present on admission) Decompensated Nash cirrhosis.   With portal gastropathy, esophageal and gastric varices and hepatic hydrothorax. IR consulted  patient is not a candidate for TIPS except in case of life-threatening gastric variceal bleeding. Continue with medical management. - GI following, Continue to appreciate recommendations - continue diuresis with lasix and aldactone  -Off octreotide, started nadolol -Overall prognosis is poor, palliative care follow-up recommended, discussed CODE STATUS again with the patient, now DNR    Thrombosis, portal vein Chronic portal vein thrombosis -Noted on imaging this admission, started on anticoagulation for this, was on IV heparin initially transitioned to Eliquis in ICU -Discussed with gastroenterology 2/20, portal vein thrombosis is chronic as noted on imaging and at the same time she is at high risk of bleeding with known esophageal or gastric varices, coagulopathy and severe thrombocytopenia, her bleeding risk remains very high, we discontinued anticoagulation yesterday  HE (hepatic encephalopathy) History of hepatic encephalopathy -Mental status remains stable, continue lactulose and rifaximin, decrease lactulose dose for diarrhea  Type 2 diabetes mellitus with complication, without long-term current use of insulin (HCC) CBG stable on sliding scale insulin  Anemia of chronic disease Stable, monitor  Thrombocytopenia (HCC) Chronic secondary to liver cirrhosis, portal hypertension    DVT prophylaxis: SCDs Code Status: DNR Family Communication: Updated niece Courtney Braun yesterday Disposition Plan: SNF when bed available  Consultants:  Gastroenterology  Procedures:    Antimicrobials:    Objective: Vitals:   05/09/21 0000 05/09/21 0400 05/09/21 1100 05/09/21 1145  BP: (!) 116/56 (!) 111/55 (!) 122/48 (!) 103/52  Pulse: (!) 59 (!) 59  63  Resp: 16 16  19   Temp: 97.9 F (36.6 C) 97.9 F (36.6 C)  98.2 F (36.8 C)  TempSrc:  Oral Oral  Oral  SpO2: 95% 97% 94% 92%  Weight:  112.6 kg    Height:        Intake/Output Summary (Last 24 hours) at 05/09/2021 1413 Last data filed at 05/09/2021 1004 Gross per 24 hour  Intake 200 ml  Output --  Net 200 ml   Filed Weights   05/07/21 0500 05/08/21 0600 05/09/21 0400  Weight: 113.9 kg 112.8 kg 112.6 kg    Examination:  General exam: Pleasant elderly female chronically ill-appearing, AAOx3, no distress HEENT: No JVD CVS: S1-S2, regular rate rhythm Lungs: Clear bilaterally Abdomen: Soft, nontender, bowel sounds present Extremities: Trace edema  Skin: No rashes Psychiatry:  Mood & affect appropriate.     Data Reviewed:   CBC: Recent Labs  Lab 05/05/21 0603 05/06/21 0155 05/07/21 0150 05/08/21 0023 05/09/21 0104  WBC 6.0 5.4 4.2 5.6 7.5  HGB 9.5* 8.9* 8.9* 8.7* 9.1*  HCT 29.9* 27.9* 26.8* 26.8* 27.8*  MCV 99.3 95.5 94.7 95.7 95.9  PLT 55* 56* 42* 47* 49*   Basic Metabolic Panel: Recent Labs  Lab 05/05/21 0603 05/06/21 0155 05/07/21 0150 05/08/21 0023 05/09/21 0104  NA 144 138 136 137 137  K 5.0 3.6 3.4* 3.8 4.0  CL 105 98 95* 97* 99  CO2 33* 36* 35* 33* 34*  GLUCOSE 129* 102* 112* 122* 112*  BUN 36* 28* 22 20 19   CREATININE 0.80 0.81 0.68 0.83 0.78  CALCIUM 8.3* 8.2* 8.2* 8.6* 8.5*   GFR: Estimated Creatinine Clearance: 79.1 mL/min (by C-G formula based on SCr of 0.78 mg/dL). Liver Function Tests: Recent Labs  Lab 05/05/21 0603 05/06/21 0155 05/07/21 0150 05/08/21 0023 05/09/21 0104  AST 66* 33 32 37 45*  ALT 20 21 18 19 26   ALKPHOS 56 57 48 55 66  BILITOT 1.5* 1.2 1.8* 2.6* 2.9*  PROT 5.6* 5.4* 5.6* 5.9* 5.9*  ALBUMIN 2.3* 2.1* 2.7* 3.2* 3.0*   No results  for input(s): LIPASE, AMYLASE in the last 168 hours. No results for input(s): AMMONIA in the last 168 hours. Coagulation Profile: Recent Labs  Lab 05/03/21 0656  INR 1.8*   Cardiac Enzymes: No results for input(s): CKTOTAL, CKMB, CKMBINDEX, TROPONINI in the last 168 hours. BNP (last 3 results) No results for input(s): PROBNP in the last 8760 hours. HbA1C: No results for input(s): HGBA1C in the last 72 hours. CBG: Recent Labs  Lab 05/08/21 1147 05/08/21 1551 05/08/21 2036 05/09/21 0824 05/09/21 1144  GLUCAP 175* 180* 142* 89 179*   Lipid Profile: No results for input(s): CHOL, HDL, LDLCALC, TRIG, CHOLHDL, LDLDIRECT in the last 72 hours. Thyroid Function Tests: No results for input(s): TSH, T4TOTAL, FREET4, T3FREE, THYROIDAB in the last 72 hours. Anemia Panel: No results for input(s): VITAMINB12, FOLATE, FERRITIN, TIBC, IRON, RETICCTPCT in the last 72 hours. Urine analysis: No results found for: COLORURINE, APPEARANCEUR, LABSPEC, [place], GLUCOSEU, HGBUR, BILIRUBINUR, KETONESUR, PROTEINUR, UROBILINOGEN, NITRITE, LEUKOCYTESUR Sepsis Labs: @LABRCNTIP (procalcitonin:4,lacticidven:4)  ) Recent Results (from the past 240 hour(s))  MRSA Next Gen by PCR, Nasal     Status: Abnormal   Collection Time: 04/29/21  3:18 PM   Specimen: Nasal Mucosa; Nasal Swab  Result Value Ref Range Status   MRSA by PCR Next Gen DETECTED (A) NOT DETECTED Final    Comment: RESULT CALLED TO, READ BACK BY AND VERIFIED WITH: A,HAFNER @1800  04/29/21 EB (NOTE) The GeneXpert MRSA Assay (FDA approved for NASAL specimens only), is one component of a comprehensive MRSA colonization surveillance program. It is not intended to  diagnose MRSA infection nor to guide or monitor treatment for MRSA infections. Test performance is not FDA approved in patients less than 4 years old. Performed at [facility], [place] [address] [place], [place] [address]   Culture, blood (routine x 2)     Status: None    Collection Time: 04/29/21  3:27 PM   Specimen: BLOOD  Result Value Ref Range Status   Specimen Description BLOOD SITE NOT SPECIFIED  Final   Special Requests   Final    BOTTLES DRAWN AEROBIC ONLY Blood Culture results may not be optimal due to an inadequate volume of blood received in culture bottles   Culture   Final    NO GROWTH 5 DAYS Performed at [facility], [place] [address], [place], [place] [address]    Report Status 05/04/2021 FINAL  Final  Culture, blood (routine x 2)     Status: None   Collection Time: 04/29/21  3:27 PM   Specimen: BLOOD  Result Value Ref Range Status   Specimen Description BLOOD SITE NOT SPECIFIED  Final   Special Requests   Final    BOTTLES DRAWN AEROBIC ONLY Blood Culture results may not be optimal due to an inadequate volume of blood received in culture bottles   Culture   Final    NO GROWTH 5 DAYS Performed at [facility], [place] [address], [place], [place] [address]    Report Status 05/04/2021 FINAL  Final  Culture, Respiratory w Gram Stain     Status: None   Collection Time: 04/29/21  3:32 PM   Specimen: Tracheal Aspirate; Respiratory  Result Value Ref Range Status   Specimen Description TRACHEAL ASPIRATE  Final   Special Requests NONE  Final   Gram Stain   Final    FEW WBC PRESENT,BOTH PMN AND MONONUCLEAR NO ORGANISMS SEEN    Culture   Final    Normal respiratory flora-no Staph aureus or Pseudomonas seen Performed at [facility], [address] [place], [place] [address]    Report Status 05/01/2021 FINAL  Final     Radiology Studies: No results found.   Scheduled Meds:  feeding supplement  237 mL Oral BID BM   furosemide  40 mg Oral Daily   Gerhardt's butt cream   Topical TID   insulin aspart  1-3 Units Subcutaneous TID AC & HS   multivitamin with minerals  1 tablet Oral Daily   nadolol  20 mg Oral Daily   pantoprazole  40 mg Oral Daily   rifaximin  550 mg Oral BID   simvastatin  40 mg Oral QHS   sodium  chloride flush  3 mL Intravenous Q12H   spironolactone  100 mg Oral Daily   Continuous Infusions:  sodium chloride Stopped (05/05/21 1416)   sodium chloride       LOS: 12 days    Time spent: 38mn  PDomenic Polite MD [facility]   05/09/2021, 2:13 PM

## 2021-05-09 NOTE — TOC Progression Note (Addendum)
Transition of Care [facility]) - Progression Note    Patient Details  Name: VONETTE GROSSO MRN: [id number] Date of Birth: [birth year]  [facility] CM/SW [place], LCSW Phone Number: 05/09/2021, 8:30 AM  Clinical Narrative:    CSW requested [facility] begin insurance authorization (not managed by [facility]). They will submit clinicals once updated PT note is in.  Expected Discharge Plan: [place] Barriers to Discharge: Continued Medical Work up, Ship broker  Expected Discharge Plan and Services Expected Discharge Plan: [place] In-house Referral: Clinical Social Work   Post Acute Care Choice: [place] Living arrangements for the past 2 months: Single Family Home                                       Social Determinants of Health (SDOH) Interventions    Readmission Risk Interventions No flowsheet data found.

## 2021-05-10 DIAGNOSIS — Z9889 Other specified postprocedural states: Secondary | ICD-10-CM | POA: Diagnosis not present

## 2021-05-10 DIAGNOSIS — K766 Portal hypertension: Secondary | ICD-10-CM | POA: Diagnosis not present

## 2021-05-10 DIAGNOSIS — E785 Hyperlipidemia, unspecified: Secondary | ICD-10-CM | POA: Diagnosis not present

## 2021-05-10 DIAGNOSIS — R400 Somnolence: Secondary | ICD-10-CM | POA: Diagnosis not present

## 2021-05-10 DIAGNOSIS — R404 Transient alteration of awareness: Secondary | ICD-10-CM | POA: Diagnosis not present

## 2021-05-10 DIAGNOSIS — M25539 Pain in unspecified wrist: Secondary | ICD-10-CM | POA: Diagnosis not present

## 2021-05-10 DIAGNOSIS — K7581 Nonalcoholic steatohepatitis (NASH): Secondary | ICD-10-CM | POA: Diagnosis not present

## 2021-05-10 DIAGNOSIS — S52592D Other fractures of lower end of left radius, subsequent encounter for closed fracture with routine healing: Secondary | ICD-10-CM | POA: Diagnosis not present

## 2021-05-10 DIAGNOSIS — I6523 Occlusion and stenosis of bilateral carotid arteries: Secondary | ICD-10-CM | POA: Diagnosis not present

## 2021-05-10 DIAGNOSIS — I10 Essential (primary) hypertension: Secondary | ICD-10-CM | POA: Diagnosis not present

## 2021-05-10 DIAGNOSIS — D638 Anemia in other chronic diseases classified elsewhere: Secondary | ICD-10-CM | POA: Diagnosis not present

## 2021-05-10 DIAGNOSIS — Y92129 Unspecified place in nursing home as the place of occurrence of the external cause: Secondary | ICD-10-CM | POA: Diagnosis not present

## 2021-05-10 DIAGNOSIS — Z8719 Personal history of other diseases of the digestive system: Secondary | ICD-10-CM | POA: Diagnosis not present

## 2021-05-10 DIAGNOSIS — R262 Difficulty in walking, not elsewhere classified: Secondary | ICD-10-CM | POA: Diagnosis not present

## 2021-05-10 DIAGNOSIS — R188 Other ascites: Secondary | ICD-10-CM | POA: Diagnosis not present

## 2021-05-10 DIAGNOSIS — I8500 Esophageal varices without bleeding: Secondary | ICD-10-CM | POA: Diagnosis not present

## 2021-05-10 DIAGNOSIS — W19XXXA Unspecified fall, initial encounter: Secondary | ICD-10-CM | POA: Diagnosis not present

## 2021-05-10 DIAGNOSIS — E1165 Type 2 diabetes mellitus with hyperglycemia: Secondary | ICD-10-CM | POA: Diagnosis not present

## 2021-05-10 DIAGNOSIS — I81 Portal vein thrombosis: Secondary | ICD-10-CM | POA: Diagnosis not present

## 2021-05-10 DIAGNOSIS — M25532 Pain in left wrist: Secondary | ICD-10-CM | POA: Diagnosis not present

## 2021-05-10 DIAGNOSIS — K7469 Other cirrhosis of liver: Secondary | ICD-10-CM | POA: Diagnosis not present

## 2021-05-10 DIAGNOSIS — E119 Type 2 diabetes mellitus without complications: Secondary | ICD-10-CM | POA: Diagnosis not present

## 2021-05-10 DIAGNOSIS — D696 Thrombocytopenia, unspecified: Secondary | ICD-10-CM | POA: Diagnosis not present

## 2021-05-10 DIAGNOSIS — E440 Moderate protein-calorie malnutrition: Secondary | ICD-10-CM | POA: Diagnosis not present

## 2021-05-10 DIAGNOSIS — W010XXA Fall on same level from slipping, tripping and stumbling without subsequent striking against object, initial encounter: Secondary | ICD-10-CM | POA: Diagnosis not present

## 2021-05-10 DIAGNOSIS — R17 Unspecified jaundice: Secondary | ICD-10-CM | POA: Diagnosis not present

## 2021-05-10 DIAGNOSIS — R102 Pelvic and perineal pain: Secondary | ICD-10-CM | POA: Diagnosis not present

## 2021-05-10 DIAGNOSIS — Z6832 Body mass index (BMI) 32.0-32.9, adult: Secondary | ICD-10-CM | POA: Diagnosis not present

## 2021-05-10 DIAGNOSIS — S6992XA Unspecified injury of left wrist, hand and finger(s), initial encounter: Secondary | ICD-10-CM | POA: Diagnosis present

## 2021-05-10 DIAGNOSIS — F32A Depression, unspecified: Secondary | ICD-10-CM | POA: Diagnosis not present

## 2021-05-10 DIAGNOSIS — J9601 Acute respiratory failure with hypoxia: Secondary | ICD-10-CM | POA: Diagnosis not present

## 2021-05-10 DIAGNOSIS — J309 Allergic rhinitis, unspecified: Secondary | ICD-10-CM | POA: Diagnosis not present

## 2021-05-10 DIAGNOSIS — Z7401 Bed confinement status: Secondary | ICD-10-CM | POA: Diagnosis not present

## 2021-05-10 DIAGNOSIS — K8020 Calculus of gallbladder without cholecystitis without obstruction: Secondary | ICD-10-CM | POA: Diagnosis not present

## 2021-05-10 DIAGNOSIS — I700 Atherosclerosis of aorta: Secondary | ICD-10-CM | POA: Diagnosis not present

## 2021-05-10 DIAGNOSIS — I8511 Secondary esophageal varices with bleeding: Secondary | ICD-10-CM | POA: Diagnosis not present

## 2021-05-10 DIAGNOSIS — D649 Anemia, unspecified: Secondary | ICD-10-CM | POA: Diagnosis not present

## 2021-05-10 DIAGNOSIS — K7682 Hepatic encephalopathy: Secondary | ICD-10-CM | POA: Diagnosis not present

## 2021-05-10 DIAGNOSIS — W19XXXD Unspecified fall, subsequent encounter: Secondary | ICD-10-CM | POA: Diagnosis not present

## 2021-05-10 DIAGNOSIS — S2231XA Fracture of one rib, right side, initial encounter for closed fracture: Secondary | ICD-10-CM | POA: Diagnosis not present

## 2021-05-10 DIAGNOSIS — G9340 Encephalopathy, unspecified: Secondary | ICD-10-CM | POA: Diagnosis not present

## 2021-05-10 DIAGNOSIS — R609 Edema, unspecified: Secondary | ICD-10-CM | POA: Diagnosis not present

## 2021-05-10 DIAGNOSIS — R2689 Other abnormalities of gait and mobility: Secondary | ICD-10-CM | POA: Diagnosis not present

## 2021-05-10 DIAGNOSIS — J9691 Respiratory failure, unspecified with hypoxia: Secondary | ICD-10-CM | POA: Diagnosis not present

## 2021-05-10 DIAGNOSIS — S52602A Unspecified fracture of lower end of left ulna, initial encounter for closed fracture: Secondary | ICD-10-CM | POA: Diagnosis not present

## 2021-05-10 DIAGNOSIS — I119 Hypertensive heart disease without heart failure: Secondary | ICD-10-CM | POA: Diagnosis not present

## 2021-05-10 DIAGNOSIS — S59292A Other physeal fracture of lower end of radius, left arm, initial encounter for closed fracture: Secondary | ICD-10-CM | POA: Diagnosis not present

## 2021-05-10 DIAGNOSIS — J189 Pneumonia, unspecified organism: Secondary | ICD-10-CM | POA: Diagnosis not present

## 2021-05-10 DIAGNOSIS — J90 Pleural effusion, not elsewhere classified: Secondary | ICD-10-CM | POA: Diagnosis not present

## 2021-05-10 DIAGNOSIS — E46 Unspecified protein-calorie malnutrition: Secondary | ICD-10-CM | POA: Diagnosis not present

## 2021-05-10 DIAGNOSIS — R402411 Glasgow coma scale score 13-15, in the field [EMT or ambulance]: Secondary | ICD-10-CM | POA: Diagnosis not present

## 2021-05-10 DIAGNOSIS — R161 Splenomegaly, not elsewhere classified: Secondary | ICD-10-CM | POA: Diagnosis not present

## 2021-05-10 DIAGNOSIS — S52502A Unspecified fracture of the lower end of left radius, initial encounter for closed fracture: Secondary | ICD-10-CM | POA: Diagnosis not present

## 2021-05-10 DIAGNOSIS — Z8673 Personal history of transient ischemic attack (TIA), and cerebral infarction without residual deficits: Secondary | ICD-10-CM | POA: Diagnosis not present

## 2021-05-10 DIAGNOSIS — K7291 Hepatic failure, unspecified with coma: Secondary | ICD-10-CM | POA: Diagnosis not present

## 2021-05-10 DIAGNOSIS — I959 Hypotension, unspecified: Secondary | ICD-10-CM | POA: Diagnosis not present

## 2021-05-10 DIAGNOSIS — E7220 Disorder of urea cycle metabolism, unspecified: Secondary | ICD-10-CM | POA: Diagnosis not present

## 2021-05-10 DIAGNOSIS — D6959 Other secondary thrombocytopenia: Secondary | ICD-10-CM | POA: Diagnosis not present

## 2021-05-10 DIAGNOSIS — Z66 Do not resuscitate: Secondary | ICD-10-CM | POA: Diagnosis not present

## 2021-05-10 DIAGNOSIS — I728 Aneurysm of other specified arteries: Secondary | ICD-10-CM | POA: Diagnosis not present

## 2021-05-10 DIAGNOSIS — M6259 Muscle wasting and atrophy, not elsewhere classified, multiple sites: Secondary | ICD-10-CM | POA: Diagnosis not present

## 2021-05-10 DIAGNOSIS — E7849 Other hyperlipidemia: Secondary | ICD-10-CM | POA: Diagnosis not present

## 2021-05-10 DIAGNOSIS — K769 Liver disease, unspecified: Secondary | ICD-10-CM | POA: Diagnosis not present

## 2021-05-10 DIAGNOSIS — K7460 Unspecified cirrhosis of liver: Secondary | ICD-10-CM | POA: Diagnosis not present

## 2021-05-10 DIAGNOSIS — R0902 Hypoxemia: Secondary | ICD-10-CM | POA: Diagnosis not present

## 2021-05-10 DIAGNOSIS — Z9181 History of falling: Secondary | ICD-10-CM | POA: Diagnosis not present

## 2021-05-10 DIAGNOSIS — Z4682 Encounter for fitting and adjustment of non-vascular catheter: Secondary | ICD-10-CM | POA: Diagnosis not present

## 2021-05-10 DIAGNOSIS — R0602 Shortness of breath: Secondary | ICD-10-CM | POA: Diagnosis not present

## 2021-05-10 DIAGNOSIS — R4182 Altered mental status, unspecified: Secondary | ICD-10-CM | POA: Diagnosis not present

## 2021-05-10 LAB — BASIC METABOLIC PANEL
Anion gap: 7 (ref 5–15)
BUN: 19 mg/dL (ref 8–23)
CO2: 30 mmol/L (ref 22–32)
Calcium: 8.5 mg/dL — ABNORMAL LOW (ref 8.9–10.3)
Chloride: 100 mmol/L (ref 98–111)
Creatinine, Ser: 0.81 mg/dL (ref 0.44–1.00)
GFR, Estimated: >60 mL/min (ref >60)
Glucose, Bld: 151 mg/dL — ABNORMAL HIGH (ref 70–99)
Potassium: 4.1 mmol/L (ref 3.5–5.1)
Sodium: 137 mmol/L (ref 135–145)

## 2021-05-10 LAB — CBC
HCT: 27.5 % — ABNORMAL LOW (ref 36.0–46.0)
Hemoglobin: 8.7 g/dL — ABNORMAL LOW (ref 12.0–15.0)
MCH: 30.6 pg (ref 26.0–34.0)
MCHC: 31.6 g/dL (ref 30.0–36.0)
MCV: 96.8 fL (ref 80.0–100.0)
Platelets: 51 10*3/uL — ABNORMAL LOW (ref 150–400)
RBC: 2.84 MIL/uL — ABNORMAL LOW (ref 3.87–5.11)
RDW: 16.7 % — ABNORMAL HIGH (ref 11.5–15.5)
WBC: 6.2 10*3/uL (ref 4.0–10.5)
nRBC: 0 % (ref 0.0–0.2)

## 2021-05-10 LAB — GLUCOSE, CAPILLARY
Glucose-Capillary: 109 mg/dL — ABNORMAL HIGH (ref 70–99)
Glucose-Capillary: 196 mg/dL — ABNORMAL HIGH (ref 70–99)
Glucose-Capillary: 105 mg/dL — ABNORMAL HIGH (ref 70–99)

## 2021-05-10 LAB — SARS CORONAVIRUS 2 (TAT 6-24 HRS): SARS Coronavirus 2: NEGATIVE

## 2021-05-10 MED ORDER — PANTOPRAZOLE SODIUM 40 MG PO TBEC: 40.0000 mg | DELAYED_RELEASE_TABLET | Freq: Every day | ORAL | Status: AC

## 2021-05-10 MED ORDER — RIFAXIMIN 550 MG PO TABS: 550.0000 mg | ORAL_TABLET | Freq: Two times a day (BID) | ORAL | Status: AC

## 2021-05-10 MED ORDER — LACTULOSE 20 GM/30ML PO SOLN: 20.0000 mL | Freq: Every day | ORAL | Status: AC

## 2021-05-10 MED ORDER — SPIRONOLACTONE 100 MG PO TABS: 100.0000 mg | ORAL_TABLET | Freq: Every day | ORAL | Status: AC

## 2021-05-10 MED ORDER — MELATONIN 3 MG PO TABS: 3.0000 mg | ORAL_TABLET | Freq: Every evening | ORAL | 0 refills | Status: AC | PRN

## 2021-05-10 MED ORDER — FUROSEMIDE 40 MG PO TABS: 40.0000 mg | ORAL_TABLET | Freq: Every day | ORAL | Status: AC

## 2021-05-10 MED ORDER — LOPERAMIDE HCL 2 MG PO CAPS
2.0000 mg | ORAL_CAPSULE | Freq: Once | ORAL | Status: AC
  Administered 2021-05-10: 2 mg via ORAL
  Filled 2021-05-10: qty 1

## 2021-05-10 MED ORDER — NADOLOL 20 MG PO TABS
20.0000 mg | ORAL_TABLET | Freq: Every day | ORAL | 0 refills | Status: AC
Start: 2021-05-11 — End: ?

## 2021-05-10 NOTE — Consult Note (Signed)
° °  [facility] Inpatient Consult   05/10/2021  [name] July 06, 1950 [id number]  [employer] [ACO] Patient:  [employer] Medicare  Primary Care Provider:  Mayra Neer, MD, [facility] Physician at [facility]   Patient screened for LLOS length of stay hospitalization with noted and to assess for potential [place] Management service needs for post hospital transition.  Review of patient's medical record reveals patient is transitioning to a skilled nursing facility for post hospital care.  Plan:  Patient post hospital transition needs are to be met at a skilled nursing facility level of care.  For questions contact:   Natividad Brood, RN BSN [facility] Liaison  [phone] business mobile phone Toll free office [phone]  Fax number: [phone] [email or web address]

## 2021-05-10 NOTE — TOC Transition Note (Signed)
Transition of Care [facility]) - CM/SW Discharge Note   Patient Details  Name: EVANGELINA DELANCEY MRN: [id number] Date of Birth: 23-Nov-1950  Transition of [facility]) CM/SW Contact:  Benard Halsted, LCSW Phone Number: 05/10/2021, 1:13 PM   Clinical Narrative:    Patient will DC to: [facility] Anticipated DC date: 05/10/21 Family notified: Niece, Surveyor, mining by: Corey Harold   Per MD patient ready for DC to [facility]. RN to call report prior to discharge [phone] room [phone]). RN, patient, patient's family, and facility notified of DC. Discharge Summary and FL2 sent to facility. DC packet on chart. Ambulance transport requested for patient.   CSW will sign off for now as social work intervention is no longer needed. Please consult [place] again if new needs arise.     Final next level of care: Skilled Nursing Facility Barriers to Discharge: Barriers Resolved   Patient Goals and CMS Choice Patient states their goals for this hospitalization and ongoing recovery are:: able to take care of self and dog CMS Medicare.gov Compare Post Acute Care list provided to:: Patient Choice offered to / list presented to : Patient  Discharge Placement   Existing PASRR number confirmed : 05/10/21          Patient [facility] at: [facility] Patient to be transferred to facility by: [place] Name of family member notified: Niece Patient and family notified of of transfer: 05/10/21  Discharge Plan and Services In-house Referral: Clinical Social Work   Post Acute Care Choice: [place]                               Social Determinants of Health (SDOH) Interventions     Readmission Risk Interventions No flowsheet data found.

## 2021-05-10 NOTE — Discharge Summary (Signed)
Physician Discharge Summary  Courtney Braun TXH:[id number] DOB: 1950/12/25 DOA: 04/27/2021  PCP: Mayra Neer, MD  Admit date: 04/27/2021 Discharge date: 05/10/2021  Time spent: 35 minutes  Recommendations for Outpatient Follow-up:  Gastroenterology Dr. Watt Climes in 2 to 3 weeks Palliative care follow-up at SNF, prognosis is poor Please check BMP in 1 week Titrate up lactulose dose when diarrhea has improved for 2-3 soft stools per day  Discharge Diagnoses:  Principal Problem:   Acute respiratory failure with hypoxia ([place]) Active Problems:   Septic shock ([place])   Liver cirrhosis secondary to NASH ([place])   Thrombosis, portal vein   HE (hepatic encephalopathy)   Type 2 diabetes mellitus with complication, without long-term current use of insulin (HCC)   Anemia of chronic disease   Portal hypertension ([place])   Benign essential HTN   Hx of esophageal varices   Malnutrition of moderate degree   Thrombocytopenia ([place]) DO NOT RESUSCITATE  Discharge Condition: Stable  Diet recommendation: Low-sodium, diabetic  Filed Weights   05/07/21 0500 05/08/21 0600 05/09/21 0400  Weight: 113.9 kg 112.8 kg 112.6 kg    History of present illness and brief course 71/f with a history of Nash cirrhosis complicated by portal hypertension, gastric varices,esophageal varices, chronic anemia and right hepatic hydrothorax, along with history of diabetes, hypertension, hyperlipidemia. Admitted on 2/9 with progressive abdominal distention and associated dyspnea ongoing decline for 1 year. -started on diuretics, she underwent a right thoracentesis on 2/10.   -Subsequently developed acute respiratory distress and healthcare associated pneumonia with worsening hypoxia , subsequently transferred to the ICU and intubated, repeat x-ray noted bilateral alveolar infiltrates especially in the right  -Treated with broad-spectrum antibiotics, pleural fluid was transudative, Gram stain negative -Interventional radiology  was also consulted for TIPS eval-not recommended at this time, CT angio abdomen pelvis noted chronic portal vein thrombosis, hepatic hydrothorax, gastro renal shunting and bilateral patchy airspace opacities -Subsequently extubated 2/16 -Transferred to [facility] service 2/18  Detailed Hospital Course:   Acute respiratory failure with hypoxia ([place]) Hepatic Hydrothorax Healthcare associated pneumonia -Admitted with dyspnea, fluid overload, large right pleural effusion and pneumonia  -underwent thoracentesis 2/10-1.5 L drained, fluid was transudative, cultures negative -Intubated 2/11, extubated 2/16 -Diffuse bilateral pulmonary infiltrates noted on CT concerning for HCAP.  -Clinically improving, O2 weaned down to 1-2 L -Completed antibiotic course 2/17, now off, blood and trach aspirate cultures are negative -Clinically improving, diuresed with IV Lasix and oral Aldactone, she is 4 L negative, transitioned to oral Lasix -Echo with preserved EF, grade 2 diastolic dysfunction -PT eval-SNF recommended,  overall prognosis remains poor-patient and niece updated regarding this, will need palliative care follow-up, discharged to SNF for short-term rehab -Will need close follow-up with her primary gastroenterologist Dr.Magod in few weeks    Septic shock ([place])- (present on admission) Treated with pressors and antibiotics in ICU, resolved   Liver cirrhosis secondary to NASH [facility])- (present on admission) Decompensated Nash cirrhosis.   With portal gastropathy, esophageal and gastric varices and hepatic hydrothorax. IR consulted  patient is not a candidate for TIPS except in case of life-threatening gastric variceal bleeding. Continue with medical management. - continue diuresis with lasix and aldactone, volume status has considerably improved -Off octreotide, started nadolol -Overall prognosis is poor, palliative care follow-up recommended, discussed CODE STATUS again with the patient, now DNR   Chronic  portal vein thrombosis -Noted on imaging this admission, started on anticoagulation for this, started on IV heparin initially transitioned to Eliquis in ICU -Discussed with gastroenterology 2/20,  portal vein thrombosis is chronic as noted on imaging and at the same time she is at high risk of bleeding with known esophageal or gastric varices, coagulopathy and severe thrombocytopenia, considering her very high bleeding risk and chronic thrombosis we discontinued anticoagulation 2/20   HE (hepatic encephalopathy) History of hepatic encephalopathy -Mental status remains stable, continue lactulose and rifaximin, decreased lactulose dose for diarrhea   Type 2 diabetes mellitus with complication, without long-term current use of insulin (HCC) CBG stable, diet controlled  Anemia of chronic disease Stable, monitor   Thrombocytopenia (HCC) Chronic secondary to liver cirrhosis, portal hypertension      Consultations: PCCM, gastroenterology  Discharge Exam: Vitals:   05/10/21 0754 05/10/21 1110  BP: (!) 111/54 (!) 102/59  Pulse: 65 65  Resp: 17 20  Temp: 98 F (36.7 C) 98.1 F (36.7 C)  SpO2: 97% 98%    General exam: Pleasant elderly female chronically ill-appearing, AAOx3, no distress HEENT: No JVD CVS: S1-S2, regular rate rhythm Lungs: Clear bilaterally Abdomen: Soft, nontender, bowel sounds present Extremities: Trace edema  Skin: No rashes Psychiatry:  Mood & affect appropriate.   Discharge Instructions    Allergies as of 05/10/2021       Reactions   Metformin And Related    Upset stomach   Voltaren [diclofenac]    diarrhea        Medication List     STOP taking these medications    lisinopril-hydrochlorothiazide 20-12.5 MG tablet Commonly known as: ZESTORETIC   propranolol 40 MG tablet Commonly known as: INDERAL       TAKE these medications    acetaminophen 500 MG tablet Commonly known as: TYLENOL Take 1,000 mg by mouth every 6 (six) hours as needed  for moderate pain or headache.   cetirizine 10 MG tablet Commonly known as: ZYRTEC Take 10 mg by mouth daily as needed for allergies.   ferrous sulfate 325 (65 FE) MG tablet Take 325 mg by mouth daily with breakfast.   furosemide 40 MG tablet Commonly known as: LASIX Take 1 tablet (40 mg total) by mouth daily. What changed:  medication strength how much to take   Lactulose 20 GM/30ML Soln Take 20 mLs (13.3333 g total) by mouth daily. Titrate for 2-3 soft stools/day, increase dose to TID if needed What changed:  how much to take when to take this additional instructions   melatonin 3 MG Tabs tablet Take 1 tablet (3 mg total) by mouth at bedtime as needed.   multivitamin with minerals Tabs tablet Take 1 tablet by mouth daily.   nadolol 20 MG tablet Commonly known as: CORGARD Take 1 tablet (20 mg total) by mouth daily. Start taking on: May 11, 2021   ondansetron 8 MG disintegrating tablet Commonly known as: Zofran ODT Take 1 tablet (8 mg total) by mouth every 8 (eight) hours as needed for nausea or vomiting.   pantoprazole 40 MG tablet Commonly known as: PROTONIX Take 1 tablet (40 mg total) by mouth daily. Start taking on: May 11, 2021   rifaximin 550 MG Tabs tablet Commonly known as: XIFAXAN Take 1 tablet (550 mg total) by mouth 2 (two) times daily.   sertraline 50 MG tablet Commonly known as: ZOLOFT Take 50 mg by mouth daily.   simvastatin 40 MG tablet Commonly known as: ZOCOR Take 40 mg by mouth at bedtime.   spironolactone 100 MG tablet Commonly known as: ALDACTONE Take 1 tablet (100 mg total) by mouth daily. What changed:  medication strength how  much to take       Allergies  Allergen Reactions   Metformin And Related     Upset stomach   Voltaren [Diclofenac]     diarrhea    Contact information for after-discharge care     [facility] .   Service: Skilled Nursing Contact  information: [place] [phone]                      The results of significant diagnostics from this hospitalization (including imaging, microbiology, ancillary and laboratory) are listed below for reference.    Significant Diagnostic Studies: DG Chest 1 View  Result Date: 04/28/2021 CLINICAL DATA:  Status post right thoracentesis. EXAM: CHEST  1 VIEW COMPARISON:  Dated April 27, 2021 FINDINGS: Visualized cardiac and mediastinal contours are unchanged. Moderate right pleural effusion, decreased in size when compared with prior exam. No evidence of pneumothorax. Increased heterogeneous opacities of the left hemithorax. No large pleural effusion or pneumothorax. IMPRESSION: 1. Moderate right pleural effusion, decreased in size when compared with prior exam. No evidence pneumothorax. 2. Increased heterogeneous opacities of the left hemithorax, concerning for infection or asymmetric pulmonary edema. Electronically Signed   By: Yetta Glassman M.D.   On: 04/28/2021 13:11   DG Chest 2 View  Result Date: 04/27/2021 CLINICAL DATA:  Shortness of breath EXAM: CHEST - 2 VIEW COMPARISON:  11/18/2020 FINDINGS: Interval development of large right-sided pleural effusion. Hazy opacity within the aerated portion of the right upper lobe. Left lung is clear. No left-sided pleural effusion. No pneumothorax. Grossly stable heart size. Aortic atherosclerosis. IMPRESSION: Interval development of large right-sided pleural effusion. Electronically Signed   By: Davina Poke D.O.   On: 04/27/2021 13:00   DG Abd 1 View  Result Date: 04/29/2021 CLINICAL DATA:  Evaluate OG tube placement. EXAM: ABDOMEN - 1 VIEW COMPARISON:  None. FINDINGS: The enteric tube courses below the level of the GE junction with tip projecting over the body of stomach. The side port of the enteric tube is well below the GE junction. Gallstones are incidentally identified within the right  upper quadrant of the abdomen measuring up to 8 mm. IMPRESSION: Enteric tube tip projects over the body of the stomach. Electronically Signed   By: Kerby Moors M.D.   On: 04/29/2021 16:28   CT Chest W Contrast  Result Date: 04/27/2021 CLINICAL DATA:  A 71 year old female presents for evaluation of large RIGHT-sided pleural effusion. EXAM: CT CHEST WITH CONTRAST TECHNIQUE: Multidetector CT imaging of the chest was performed during intravenous contrast administration. RADIATION DOSE REDUCTION: This exam was performed according to the departmental dose-optimization program which includes automated exposure control, adjustment of the mA and/or kV according to patient size and/or use of iterative reconstruction technique. CONTRAST:  159m OMNIPAQUE IOHEXOL 300 MG/ML  SOLN COMPARISON:  CT abdomen and pelvis from 2018. FINDINGS: Cardiovascular: Calcified atheromatous plaque of the thoracic aorta. No aneurysmal dilation. Central pulmonary vasculature is unremarkable on venous phase. Heart size top normal without pericardial effusion. Mediastinum/Nodes: No thoracic inlet, axillary, mediastinal or hilar adenopathy. Esophagus grossly normal. Lungs/Pleura: Large RIGHT-sided pleural effusion with complete collapse of the RIGHT lower lobe and middle lobe and partial collapse of the RIGHT upper lobe. Pleural effusion extends into the lower chest and is sub pulmonic as well as tracking along the dependent chest. Features may be mildly loculated based on appearance in the upper and lower chest as it  extends anteriorly. Patchy areas of ground-glass attenuation in the LEFT chest are geographic, some areas measuring up to 4.2 x 3.9 cm. Some more nodular appearing areas (image 37/5) measuring between 20 and 18 mm when compared to other scattered areas of more discrete nodularity. The airways to the LEFT chest are patent. Mild narrowing of airways to the RIGHT chest without gross endobronchial abnormality of large airways. No signs  of dependent pleural nodularity. Most inferior aspect of the pleural space is not imaged as it is distended extending well inferior to what would be expected and inferior to the pleural space in the LEFT chest. Upper Abdomen: Marked hepatic cirrhosis with nodular shrunken hepatic contour. Marked worsening of liver disease compared to the exam of Aug 03, 2016. Cholelithiasis. Gallbladder does not appear distended but is not well assessed due to respiratory motion. Massive perigastric, gastric and moderate-size esophageal varices. Perigastric collateral venous network measuring up to 11 x 6 cm. Gastric varix in the gastric cardia/fundus 2.3 x 2.0 cm can be seen communicating with this large variceal network in the upper abdomen extending from coronary vein. Large splenorenal shunt. Splenomegaly. Small volume ascites in the abdomen. No acute findings relative to imaged portions of the kidneys and adrenal glands. Question RIGHT colonic thickening. Musculoskeletal: Spinal degenerative changes. No acute musculoskeletal process or destructive bone finding. IMPRESSION: 1. Large RIGHT-sided pleural effusion with near complete collapse of the lung in the RIGHT chest with partial sparing of the RIGHT upper lobe. Findings are likely related to hepatic hydrothorax in this patient with signs of worsening liver disease/cirrhosis and evidence of ascites in the upper abdomen. 2. Marked hepatic cirrhosis with signs of severe portal hypertension and evidence of large perigastric and gastric varices. GI evaluation and interventional radiology consultation may be helpful for further management as warranted. 3. Cholelithiasis, gallbladder not well assessed due to respiratory motion in the upper abdomen. 4. Signs of potential colonic thickening, correlate with any abdominal symptoms that would indicate portal colopathy. Ultimately abdominal imaging may be helpful for further evaluation in this complex patient. 5. Aortic atherosclerosis 6.  Patchy areas of ground-glass attenuation in the LEFT upper lobe most suggestive of infectious or inflammatory changes, suggest 3 month follow-up to ensure resolution Aortic Atherosclerosis (ICD10-I70.0). Electronically Signed   By: Zetta Bills M.D.   On: 04/27/2021 15:30   US Abdomen Limited  Result Date: 04/29/2021 CLINICAL DATA:  Ascites EXAM: LIMITED ABDOMEN ULTRASOUND FOR ASCITES TECHNIQUE: Limited ultrasound survey for ascites was performed in all four abdominal quadrants. COMPARISON:  None. FINDINGS: No significant ascites was identified. IMPRESSION: No significant ascites.  Paracentesis canceled. Electronically Signed   By: Miachel Roux M.D.   On: 04/29/2021 10:42   DG CHEST PORT 1 VIEW  Result Date: 04/29/2021 CLINICAL DATA:  Intubation EXAM: PORTABLE CHEST 1 VIEW COMPARISON:  04/29/2021 FINDINGS: Interval placement of endotracheal tube with distal tip terminating approximately 4.2 cm above the carina. Enteric tube courses below the diaphragm with distal tip beyond the inferior margin of the film. Stable cardiomegaly. Aortic atherosclerosis. Persistent right-sided pleural effusion. Extensive heterogeneous airspace opacities throughout both lungs, slightly improved within the right lung field. No pneumothorax. IMPRESSION: 1. Appropriate positioning of ET and enteric tubes. 2. Extensive heterogeneous airspace opacities throughout both lungs, slightly improved within the right lung field. Electronically Signed   By: Davina Poke D.O.   On: 04/29/2021 16:31   DG Chest Port 1V same Day  Result Date: 04/29/2021 CLINICAL DATA:  Shortness of breath. EXAM: PORTABLE CHEST 1  VIEW COMPARISON:  04/28/2021 and older studies.  CT, 04/27/2021 FINDINGS: Since the previous day's study, airspace opacities have increased on the right, now noted throughout the right perihilar region into the central right upper lobe. Left-sided airspace opacities are similar to the prior exam. Right pleural effusion is likely  unchanged, less defined due to the semi-erect positioning. No convincing left pleural effusion.  No pneumothorax. Stable cardiac silhouette. IMPRESSION: 1. Interval worsening of lung aeration with increased airspace opacities in the right mid and upper lung, and stable airspace opacities in the left lung. Findings suspect the multifocal pneumonia. 2. Persistent right pleural effusion. Electronically Signed   By: Lajean Manes M.D.   On: 04/29/2021 14:14   ECHOCARDIOGRAM COMPLETE  Result Date: 04/30/2021    ECHOCARDIOGRAM REPORT   Patient Name:   Courtney Braun Date of Exam: 04/30/2021 Medical Rec #:  [id number]         Height:       65.5 in Accession #:    [id number]        Weight:       243.2 lb Date of Birth:  [birth year]          BSA:          2.162 m Patient Age:    50 years          BP:           105/45 mmHg Patient Gender: F                 HR:           74 bpm. Exam Location:  Inpatient Procedure: 2D Echo Indications:    Pre-operative cardiovascular examination.  History:        Patient has no prior history of Echocardiogram examinations.                 Cirrhosis; Risk Factors:Hypertension.  Sonographer:    Johny Chess RDCS Referring Phys: [id number] [facility]  Sonographer Comments: Echo performed with patient supine and on artificial respirator. IMPRESSIONS  1. Left ventricular ejection fraction, by estimation, is 55 to 60%. The left ventricle has normal function. The left ventricle has no regional wall motion abnormalities. The left ventricular internal cavity size was moderately dilated. Left ventricular diastolic parameters are consistent with Grade II diastolic dysfunction (pseudonormalization). Elevated left ventricular end-diastolic pressure.  2. Right ventricular systolic function is normal. The right ventricular size is normal. There is mildly elevated pulmonary artery systolic pressure. The estimated right ventricular systolic pressure is 96.2 mmHg.  3. Left atrial size was mildly  dilated.  4. The mitral valve is normal in structure. Trivial mitral valve regurgitation. No evidence of mitral stenosis.  5. The aortic valve is normal in structure. Aortic valve regurgitation is trivial. No aortic stenosis is present.  6. The inferior vena cava is dilated in size with >50% respiratory variability, suggesting right atrial pressure of 8 mmHg. FINDINGS  Left Ventricle: Left ventricular ejection fraction, by estimation, is 55 to 60%. The left ventricle has normal function. The left ventricle has no regional wall motion abnormalities. The left ventricular internal cavity size was moderately dilated. There is no left ventricular hypertrophy. Left ventricular diastolic parameters are consistent with Grade II diastolic dysfunction (pseudonormalization). Elevated left ventricular end-diastolic pressure. Right Ventricle: The right ventricular size is normal. No increase in right ventricular wall thickness. Right ventricular systolic function is normal. There is mildly elevated pulmonary artery systolic pressure. The tricuspid regurgitant velocity is  2.71  m/s, and with an assumed right atrial pressure of 8 mmHg, the estimated right ventricular systolic pressure is 88.8 mmHg. Left Atrium: Left atrial size was mildly dilated. Right Atrium: Right atrial size was normal in size. Pericardium: There is no evidence of pericardial effusion. Mitral Valve: The mitral valve is normal in structure. Mild to moderate mitral annular calcification. Trivial mitral valve regurgitation. No evidence of mitral valve stenosis. Tricuspid Valve: The tricuspid valve is normal in structure. Tricuspid valve regurgitation is mild . No evidence of tricuspid stenosis. Aortic Valve: The aortic valve is normal in structure. Aortic valve regurgitation is trivial. No aortic stenosis is present. Aortic valve mean gradient measures 10.0 mmHg. Aortic valve peak gradient measures 18.3 mmHg. Aortic valve area, by VTI measures 2.66 cm. Pulmonic  Valve: The pulmonic valve was normal in structure. Pulmonic valve regurgitation is not visualized. No evidence of pulmonic stenosis. Aorta: The aortic root is normal in size and structure. Venous: The inferior vena cava is dilated in size with greater than 50% respiratory variability, suggesting right atrial pressure of 8 mmHg. IAS/Shunts: No atrial level shunt detected by color flow Doppler.  LEFT VENTRICLE PLAX 2D LVIDd:         6.00 cm   Diastology LVIDs:         4.10 cm   LV e' medial:    5.77 cm/s LV PW:         0.70 cm   LV E/e' medial:  21.5 LV IVS:        0.90 cm   LV e' lateral:   11.90 cm/s LVOT diam:     2.20 cm   LV E/e' lateral: 10.4 LV SV:         117 LV SV Index:   54 LVOT Area:     3.80 cm  RIGHT VENTRICLE             IVC RV S prime:     12.30 cm/s  IVC diam: 2.50 cm TAPSE (M-mode): 1.6 cm LEFT ATRIUM             Index        RIGHT ATRIUM           Index LA diam:        4.60 cm 2.13 cm/m   RA Area:     16.80 cm LA Vol (A2C):   64.3 ml 29.74 ml/m  RA Volume:   44.80 ml  20.72 ml/m LA Vol (A4C):   75.2 ml 34.79 ml/m LA Biplane Vol: 77.0 ml 35.62 ml/m  AORTIC VALVE AV Area (Vmax):    2.70 cm AV Area (Vmean):   2.67 cm AV Area (VTI):     2.66 cm AV Vmax:           214.00 cm/s AV Vmean:          148.000 cm/s AV VTI:            0.439 m AV Peak Grad:      18.3 mmHg AV Mean Grad:      10.0 mmHg LVOT Vmax:         152.00 cm/s LVOT Vmean:        104.000 cm/s LVOT VTI:          0.307 m LVOT/AV VTI ratio: 0.70  AORTA Ao Asc diam: 3.30 cm MITRAL VALVE                TRICUSPID VALVE MV Area (PHT): 3.42  cm     TR Peak grad:   29.4 mmHg MV Decel Time: 222 msec     TR Vmax:        271.00 cm/s MV E velocity: 124.00 cm/s MV A velocity: 103.00 cm/s  SHUNTS MV E/A ratio:  1.20         Systemic VTI:  0.31 m                             Systemic Diam: 2.20 cm Fransico Him MD Electronically signed by Fransico Him MD Signature Date/Time: 04/30/2021/4:35:54 PM    Final    CT Angio Abd/Pel w/ and/or w/o  Result  Date: 04/30/2021 CLINICAL DATA:  Portal hypertension. Evaluate anatomy for possible tips. EXAM: CTA ABDOMEN AND PELVIS WITHOUT AND WITH CONTRAST TECHNIQUE: Multidetector CT imaging of the abdomen and pelvis was performed using the standard protocol during bolus administration of intravenous contrast. Multiplanar reconstructed images and MIPs were obtained and reviewed to evaluate the vascular anatomy. RADIATION DOSE REDUCTION: This exam was performed according to the departmental dose-optimization program which includes automated exposure control, adjustment of the mA and/or kV according to patient size and/or use of iterative reconstruction technique. CONTRAST:  162m OMNIPAQUE IOHEXOL 350 MG/ML SOLN COMPARISON:  CT scan of the chest 04/27/2021; CT abdomen/pelvis 08/03/2016 FINDINGS: VASCULAR Aorta: Mild calcifications throughout the abdominal aorta. No evidence of aneurysm or dissection. Celiac: Patent at the origin. Conventional hepatic arterial branching pattern. 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 The smaller measures 0.7 cm (image 48 series 4) the larger measures 0.9 cm (image 57 of series 4). There is some abnormal arterial phase enhancement tracking along the main portal vein in the porta hepatis and extending along the right anterior division of the intrahepatic portal vein. Findings are best demonstrated on the coronal reformatted images. Given findings of portal venous thrombosis, this could represent altered arterial flow dynamics. SMA: Patent without evidence of aneurysm, dissection, vasculitis or significant stenosis. Renals: Both renal arteries are patent without evidence of aneurysm, dissection, vasculitis, fibromuscular dysplasia or significant stenosis. IMA: Patent without evidence of aneurysm, dissection, vasculitis or significant stenosis. Inflow: Patent without evidence of aneurysm, dissection, vasculitis or significant stenosis.  Proximal Outflow: Bilateral common femoral and visualized portions of the superficial and profunda femoral arteries are patent without evidence of aneurysm, dissection, vasculitis or significant stenosis. Veins: The main portal vein is small in caliber. There is evidence of some chronic thrombus within the main portal vein as it enters the liver. The posterior right division of the portal vein appears chronically thrombosed with mild cavernous transformation. The left intrahepatic portal vein is not well seen. There is a recanalized paraumbilical vein. Massive gastro renal shunt providing supply to a large network of gastric varices. The primary afferent artery from the portal system is the left gastric artery. Additionally, there are short gastric contributors arising from the splenic hilum. The left renal vein is significantly dilated due to the increased flow. Review of the MIP images confirms the above findings. NON-VASCULAR Lower chest: Large right and small left layering pleural effusions. Additionally, there is significant multifocal airspace opacity in a peribronchovascular distribution throughout the remaining lungs bilaterally with small areas of air bronchogram in the lungs. The intracardiac blood pool is hypodense relative to the adjacent myocardium consistent with anemia. No pericardial effusion. Hepatobiliary: Small, nodular cirrhotic liver. No definite discrete enhancing hepatic mass. Stones  are present within the gallbladder lumen. Mild gallbladder wall thickening which is not unexpected in the setting of cirrhosis. No biliary ductal dilatation. Pancreas: Unremarkable. No pancreatic ductal dilatation or surrounding inflammatory changes. Spleen: Splenomegaly. Adrenals/Urinary Tract: Unremarkable adrenal glands. No enhancing renal mass, hydronephrosis or nephrolithiasis. Unremarkable ureters and bladder. Stomach/Bowel: No focal bowel wall thickening or evidence of obstruction. Gastric tube in place.  Gastric varices as above. Lymphatic: No suspicious lymphadenopathy. Reproductive: Uterus and bilateral adnexa are unremarkable. Other: Small volume ascites in the pelvic cul-de-sac. Musculoskeletal: No acute or significant osseous findings. IMPRESSION: 1. Hepatic cirrhosis complicated by portal hypertension with gastric varices. 2. Massive gastro renal shunt resulting in asymmetric dilation of the left renal vein due to the diverted portosystemic blood flow. Primary afferent vein appears to be the left gastric vein. Additionally, there are short gastric contributors. 3. 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 4. Atypical arterial enhancement pattern along the main portal vein extending along the anterior division of the right portal vein favored to reflect altered arterial flow pattern in the setting of portal venous occlusion. No convincing evidence of intrahepatic mass to suggest that the enhancement along the portal vein represents tumor thrombus. 5. Significant bilateral diffuse lung opacities in a peribronchovascular distribution concerning for multifocal pneumonia including atypical/viral/COVID pneumonia. 6. Right larger than left pleural effusions may be due to the underlying pulmonary process, or reflect hepatic hydrothorax related to the underlying cirrhosis and portal hypertension. 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 7. Small splenic artery aneurysms at the splenic hilum measuring less than 1 cm. 8.  Aortic Atherosclerosis (ICD10-I70.0). 9. Small volume ascites. 10. Cholelithiasis. Signed, Criselda Peaches, MD, [facility] Radiology Electronically Signed   By: Jacqulynn Cadet M.D.   On: 04/30/2021 07:33   IR THORACENTESIS ASP PLEURAL SPACE W/IMG GUIDE  Result Date: 04/28/2021 INDICATION: Patient with history of NASH cirrhosis,  presented to the ED with shortness of breath. Found to have a right-sided pleural effusion. Request is for therapeutic and diagnostic thoracentesis. EXAM: ULTRASOUND GUIDED DIAGNOSTIC AND THERAPEUTIC THORACENTESIS MEDICATIONS: Lidocaine 1% 10 mL COMPLICATIONS: None immediate. PROCEDURE: An ultrasound guided thoracentesis was thoroughly discussed with the patient and questions answered. The benefits, risks, alternatives and complications were also discussed. The patient understands and wishes to proceed with the procedure. Written consent was obtained. Ultrasound was performed to localize and mark an adequate pocket of fluid in the right chest. The area was then prepped and draped in the normal sterile fashion. 1% Lidocaine was used for local anesthesia. Under ultrasound guidance a 19 gauge, 7-cm, Yueh catheter was introduced. Thoracentesis was performed. The catheter was removed and a dressing applied. FINDINGS: A total of approximately 1.55 L of serosanguineous fluid was removed. Samples were sent to the laboratory as requested by the clinical team. IMPRESSION: Successful ultrasound guided right-sided diagnostic and therapeutic thoracentesis yielding 1.55 L of pleural fluid. Read by: Rushie Nyhan, NP Electronically Signed   By: Corrie Mckusick D.O.   On: 04/28/2021 15:41    Microbiology: Recent Results (from the past 240 hour(s))  SARS CORONAVIRUS 2 (TAT 6-24 HRS) Nasopharyngeal Nasopharyngeal Swab     Status: None   Collection Time: 05/09/21  6:06 PM   Specimen: Nasopharyngeal Swab  Result Value Ref Range Status   SARS Coronavirus 2 NEGATIVE NEGATIVE Final    Comment: (NOTE) SARS-CoV-2 target nucleic acids are NOT DETECTED.  The  SARS-CoV-2 RNA is generally detectable in upper and lower respiratory specimens during the acute phase of infection. Negative results do not preclude SARS-CoV-2 infection, do not rule out co-infections with other pathogens, and should not be used as the sole basis for  treatment or other patient management decisions. Negative results must be combined with clinical observations, patient history, and epidemiological information. The expected result is Negative.  Fact Sheet for Patients: SugarRoll.be  Fact Sheet for Healthcare Providers: https://www.woods-mathews.com/  This test is not yet approved or cleared by the [place] FDA and  has been authorized for detection and/or diagnosis of SARS-CoV-2 by FDA under an Emergency Use Authorization (EUA). This EUA will remain  in effect (meaning this test can be used) for the duration of the COVID-19 declaration under Se ction 564(b)(1) of the Act, 21 U.S.C. section 360bbb-3(b)(1), unless the authorization is terminated or revoked sooner.  Performed at [facility], [place] [address], [place], [place] [address]      Labs: Basic Metabolic Panel: Recent Labs  Lab 05/06/21 0155 05/07/21 0150 05/08/21 0023 05/09/21 0104 05/10/21 0256  NA 138 136 137 137 137  K 3.6 3.4* 3.8 4.0 4.1  CL 98 95* 97* 99 100  CO2 36* 35* 33* 34* 30  GLUCOSE 102* 112* 122* 112* 151*  BUN 28* 22 20 19 19   CREATININE 0.81 0.68 0.83 0.78 0.81  CALCIUM 8.2* 8.2* 8.6* 8.5* 8.5*   Liver Function Tests: Recent Labs  Lab 05/05/21 0603 05/06/21 0155 05/07/21 0150 05/08/21 0023 05/09/21 0104  AST 66* 33 32 37 45*  ALT 20 21 18 19 26   ALKPHOS 56 57 48 55 66  BILITOT 1.5* 1.2 1.8* 2.6* 2.9*  PROT 5.6* 5.4* 5.6* 5.9* 5.9*  ALBUMIN 2.3* 2.1* 2.7* 3.2* 3.0*   No results for input(s): LIPASE, AMYLASE in the last 168 hours. No results for input(s): AMMONIA in the last 168 hours. CBC: Recent Labs  Lab 05/06/21 0155 05/07/21 0150 05/08/21 0023 05/09/21 0104 05/10/21 0256  WBC 5.4 4.2 5.6 7.5 6.2  HGB 8.9* 8.9* 8.7* 9.1* 8.7*  HCT 27.9* 26.8* 26.8* 27.8* 27.5*  MCV 95.5 94.7 95.7 95.9 96.8  PLT 56* 42* 47* 49* 51*   Cardiac Enzymes: No results for input(s): CKTOTAL,  CKMB, CKMBINDEX, TROPONINI in the last 168 hours. BNP: BNP (last 3 results) Recent Labs    04/27/21 1229 04/29/21 1527  BNP 154.8* 1,050.6*    ProBNP (last 3 results) No results for input(s): PROBNP in the last 8760 hours.  CBG: Recent Labs  Lab 05/09/21 1144 05/09/21 1545 05/09/21 2051 05/10/21 0756 05/10/21 1112  GLUCAP 179* 157* 138* 109* 196*       Signed:  Domenic Polite MD.  [facility] 05/10/2021, 11:49 AM

## 2021-05-10 NOTE — Progress Notes (Signed)
Report called to Dellie Burns RN received report on patient.

## 2021-05-11 DIAGNOSIS — F32A Depression, unspecified: Secondary | ICD-10-CM | POA: Diagnosis not present

## 2021-05-11 DIAGNOSIS — I81 Portal vein thrombosis: Secondary | ICD-10-CM | POA: Diagnosis not present

## 2021-05-11 DIAGNOSIS — I10 Essential (primary) hypertension: Secondary | ICD-10-CM | POA: Diagnosis not present

## 2021-05-11 DIAGNOSIS — E46 Unspecified protein-calorie malnutrition: Secondary | ICD-10-CM | POA: Diagnosis not present

## 2021-05-11 DIAGNOSIS — D649 Anemia, unspecified: Secondary | ICD-10-CM | POA: Diagnosis not present

## 2021-05-11 DIAGNOSIS — E119 Type 2 diabetes mellitus without complications: Secondary | ICD-10-CM | POA: Diagnosis not present

## 2021-05-11 DIAGNOSIS — J309 Allergic rhinitis, unspecified: Secondary | ICD-10-CM | POA: Diagnosis not present

## 2021-05-11 DIAGNOSIS — J189 Pneumonia, unspecified organism: Secondary | ICD-10-CM | POA: Diagnosis not present

## 2021-05-11 DIAGNOSIS — E785 Hyperlipidemia, unspecified: Secondary | ICD-10-CM | POA: Diagnosis not present

## 2021-05-15 DIAGNOSIS — K7460 Unspecified cirrhosis of liver: Secondary | ICD-10-CM | POA: Diagnosis not present

## 2021-05-15 DIAGNOSIS — R188 Other ascites: Secondary | ICD-10-CM | POA: Diagnosis not present

## 2021-05-15 DIAGNOSIS — I8500 Esophageal varices without bleeding: Secondary | ICD-10-CM | POA: Diagnosis not present

## 2021-05-16 DIAGNOSIS — E785 Hyperlipidemia, unspecified: Secondary | ICD-10-CM | POA: Diagnosis not present

## 2021-05-16 DIAGNOSIS — E119 Type 2 diabetes mellitus without complications: Secondary | ICD-10-CM | POA: Diagnosis not present

## 2021-05-16 DIAGNOSIS — K769 Liver disease, unspecified: Secondary | ICD-10-CM | POA: Diagnosis not present

## 2021-05-16 DIAGNOSIS — D649 Anemia, unspecified: Secondary | ICD-10-CM | POA: Diagnosis not present

## 2021-05-16 DIAGNOSIS — I10 Essential (primary) hypertension: Secondary | ICD-10-CM | POA: Diagnosis not present

## 2021-05-16 DIAGNOSIS — K7581 Nonalcoholic steatohepatitis (NASH): Secondary | ICD-10-CM | POA: Diagnosis not present

## 2021-05-24 DIAGNOSIS — I10 Essential (primary) hypertension: Secondary | ICD-10-CM | POA: Diagnosis not present

## 2021-05-24 DIAGNOSIS — K7581 Nonalcoholic steatohepatitis (NASH): Secondary | ICD-10-CM | POA: Diagnosis not present

## 2021-05-24 DIAGNOSIS — K769 Liver disease, unspecified: Secondary | ICD-10-CM | POA: Diagnosis not present

## 2021-05-24 DIAGNOSIS — F32A Depression, unspecified: Secondary | ICD-10-CM | POA: Diagnosis not present

## 2021-05-26 DIAGNOSIS — K769 Liver disease, unspecified: Secondary | ICD-10-CM | POA: Diagnosis not present

## 2021-05-26 DIAGNOSIS — F32A Depression, unspecified: Secondary | ICD-10-CM | POA: Diagnosis not present

## 2021-05-26 DIAGNOSIS — W19XXXD Unspecified fall, subsequent encounter: Secondary | ICD-10-CM | POA: Diagnosis not present

## 2021-05-26 DIAGNOSIS — I10 Essential (primary) hypertension: Secondary | ICD-10-CM | POA: Diagnosis not present

## 2021-05-30 DIAGNOSIS — I8511 Secondary esophageal varices with bleeding: Secondary | ICD-10-CM | POA: Diagnosis not present

## 2021-05-30 DIAGNOSIS — K7469 Other cirrhosis of liver: Secondary | ICD-10-CM | POA: Diagnosis not present

## 2021-05-30 DIAGNOSIS — K7291 Hepatic failure, unspecified with coma: Secondary | ICD-10-CM | POA: Diagnosis not present

## 2021-05-30 DIAGNOSIS — E440 Moderate protein-calorie malnutrition: Secondary | ICD-10-CM | POA: Diagnosis not present

## 2021-05-30 DIAGNOSIS — R188 Other ascites: Secondary | ICD-10-CM | POA: Diagnosis not present

## 2021-05-31 DIAGNOSIS — K769 Liver disease, unspecified: Secondary | ICD-10-CM | POA: Diagnosis not present

## 2021-05-31 DIAGNOSIS — I10 Essential (primary) hypertension: Secondary | ICD-10-CM | POA: Diagnosis not present

## 2021-05-31 DIAGNOSIS — F32A Depression, unspecified: Secondary | ICD-10-CM | POA: Diagnosis not present

## 2021-05-31 DIAGNOSIS — K7682 Hepatic encephalopathy: Secondary | ICD-10-CM | POA: Diagnosis not present

## 2021-06-02 ENCOUNTER — Emergency Department (HOSPITAL_COMMUNITY): Payer: Medicare HMO

## 2021-06-02 ENCOUNTER — Encounter (HOSPITAL_COMMUNITY): Payer: Self-pay | Admitting: *Deleted

## 2021-06-02 ENCOUNTER — Emergency Department (HOSPITAL_COMMUNITY)
Admission: EM | Admit: 2021-06-02 | Discharge: 2021-06-02 | Disposition: A | Discharge status: Skilled Nursing Facility | Payer: Medicare HMO | Attending: Emergency Medicine | Admitting: Emergency Medicine

## 2021-06-02 ENCOUNTER — Other Ambulatory Visit: Payer: Self-pay

## 2021-06-02 DIAGNOSIS — S52502A Unspecified fracture of the lower end of left radius, initial encounter for closed fracture: Secondary | ICD-10-CM | POA: Diagnosis not present

## 2021-06-02 DIAGNOSIS — S59292A Other physeal fracture of lower end of radius, left arm, initial encounter for closed fracture: Secondary | ICD-10-CM | POA: Diagnosis not present

## 2021-06-02 DIAGNOSIS — M25532 Pain in left wrist: Secondary | ICD-10-CM | POA: Diagnosis not present

## 2021-06-02 DIAGNOSIS — W19XXXD Unspecified fall, subsequent encounter: Secondary | ICD-10-CM | POA: Diagnosis not present

## 2021-06-02 DIAGNOSIS — Y92129 Unspecified place in nursing home as the place of occurrence of the external cause: Secondary | ICD-10-CM | POA: Insufficient documentation

## 2021-06-02 DIAGNOSIS — W010XXA Fall on same level from slipping, tripping and stumbling without subsequent striking against object, initial encounter: Secondary | ICD-10-CM | POA: Insufficient documentation

## 2021-06-02 DIAGNOSIS — S6992XA Unspecified injury of left wrist, hand and finger(s), initial encounter: Secondary | ICD-10-CM | POA: Diagnosis present

## 2021-06-02 DIAGNOSIS — F32A Depression, unspecified: Secondary | ICD-10-CM | POA: Diagnosis not present

## 2021-06-02 DIAGNOSIS — W19XXXA Unspecified fall, initial encounter: Secondary | ICD-10-CM

## 2021-06-02 DIAGNOSIS — I10 Essential (primary) hypertension: Secondary | ICD-10-CM | POA: Diagnosis not present

## 2021-06-02 DIAGNOSIS — K769 Liver disease, unspecified: Secondary | ICD-10-CM | POA: Diagnosis not present

## 2021-06-02 MED ORDER — OXYCODONE HCL 5 MG PO TABS: 5.0000 mg | ORAL_TABLET | Freq: Once | ORAL | Status: DC | PRN

## 2021-06-02 MED ORDER — OXYCODONE HCL 5 MG PO TABS: 5.0000 mg | ORAL_TABLET | Freq: Three times a day (TID) | ORAL | 0 refills | Status: AC | PRN

## 2021-06-02 NOTE — Discharge Instructions (Addendum)
Cristol has a fracture or broken bone in her left wrist, the radius of her wrist.  She was placed in a cast but will need to follow-up with the orthopedic hand surgeon.  Please contact the number above to schedule an appointment as soon as possible, within the next week if possible, with a hand surgeon.  In the meantime, she should not use her left hand for any activities or lifting.  She should wear the arm sling in the daytime.  She should keep her cast clean and dry. ? ?I prescribed a few oxycodone tablets for severe pain, but she should use tylenol whenever possible. ?

## 2021-06-02 NOTE — Progress Notes (Signed)
Orthopedic Tech Progress Note ?Patient Details:  ?Courtney Braun ?[birth year] ?[id number] ? ?Ortho Devices ?Type of Ortho Device: Sugartong splint, Arm sling ?Ortho Device/Splint Location: LUE ?Ortho Device/Splint Interventions: Application, Ordered ?  ?Post Interventions ?Patient Tolerated: Well ?Instructions Provided: Care of device ? ?Courtney Braun ?06/02/2021, 3:33 PM ? ?

## 2021-06-02 NOTE — ED Provider Notes (Signed)
?[facility] ?Provider Note ? ? ?CSN: [id number] ?Arrival date & time: 06/02/21  1339 ? ?  ? ?History ? ?CC: Left wrist pain ? ? ?Courtney Braun is a 71 y.o. female presenting from [facility] with fall onto outstretched left wrist.  This occurred maybe 2 to 3 days ago per paramedic report.  Patient ports he is having some pain in the left wrist since then.  She is right-handed.  She denies any other injuries, adamantly denies any head injury. ? ?HPI ? ?  ? ?Home Medications ?Prior to Admission medications   ?Medication Sig Start Date End Date Taking? Authorizing Provider  ?oxyCODONE (ROXICODONE) 5 MG immediate release tablet Take 1 tablet (5 mg total) by mouth every 8 (eight) hours as needed for up to 12 doses for severe pain or breakthrough pain. 06/02/21  Yes Amma Crear, Carola Rhine, MD  ?acetaminophen (TYLENOL) 500 MG tablet Take 1,000 mg by mouth every 6 (six) hours as needed for moderate pain or headache.    [provider]  ?cetirizine (ZYRTEC) 10 MG tablet Take 10 mg by mouth daily as needed for allergies.    [provider]  ?ferrous sulfate 325 (65 FE) MG tablet Take 325 mg by mouth daily with breakfast.    [provider]  ?furosemide (LASIX) 40 MG tablet Take 1 tablet (40 mg total) by mouth daily. 05/10/21   Domenic Polite, MD  ?Lactulose 20 GM/30ML SOLN Take 20 mLs (13.3333 g total) by mouth daily. Titrate for 2-3 soft stools/day, increase dose to TID if needed 05/10/21   Domenic Polite, MD  ?melatonin 3 MG TABS tablet Take 1 tablet (3 mg total) by mouth at bedtime as needed. 05/10/21   Domenic Polite, MD  ?Multiple Vitamin (MULTIVITAMIN WITH MINERALS) TABS tablet Take 1 tablet by mouth daily.    [provider]  ?nadolol (CORGARD) 20 MG tablet Take 1 tablet (20 mg total) by mouth daily. 05/11/21   Domenic Polite, MD  ?ondansetron (ZOFRAN ODT) 8 MG disintegrating tablet Take 1 tablet (8 mg total) by mouth every 8  (eight) hours as needed for nausea or vomiting. 11/19/20   Dorie Rank, MD  ?pantoprazole (PROTONIX) 40 MG tablet Take 1 tablet (40 mg total) by mouth daily. 05/11/21   Domenic Polite, MD  ?rifaximin (XIFAXAN) 550 MG TABS tablet Take 1 tablet (550 mg total) by mouth 2 (two) times daily. 05/10/21   Domenic Polite, MD  ?sertraline (ZOLOFT) 50 MG tablet Take 50 mg by mouth daily.    [provider]  ?simvastatin (ZOCOR) 40 MG tablet Take 40 mg by mouth at bedtime.     [provider]  ?spironolactone (ALDACTONE) 100 MG tablet Take 1 tablet (100 mg total) by mouth daily. 05/10/21   Domenic Polite, MD  ?   ? ?Allergies    ?Metformin and related and Voltaren [diclofenac]   ? ?Review of Systems   ?Review of Systems ? ?Physical Exam ?Updated Vital Signs ?BP 131/64   Pulse 65   Temp 98.9 ?F (37.2 ?C) (Oral)   Resp 19   Ht 6' (1.829 m)   Wt 110.2 kg   SpO2 97%   BMI 32.96 kg/m?  ?Physical Exam ?Constitutional:   ?   General: She is not in acute distress. ?HENT:  ?   Head: Normocephalic and atraumatic.  ?Eyes:  ?   Conjunctiva/sclera: Conjunctivae normal.  ?   Pupils: Pupils are equal, round, and reactive to light.  ?Cardiovascular:  ?  Rate and Rhythm: Normal rate and regular rhythm.  ?Pulmonary:  ?   Effort: Pulmonary effort is normal. No respiratory distress.  ?Abdominal:  ?   General: There is no distension.  ?   Tenderness: There is no abdominal tenderness.  ?Musculoskeletal:  ?   Comments: Mild swelling and tenderness of the left wrist ?Full range of motion of the bilateral hips  ?Skin: ?   General: Skin is warm and dry.  ?Neurological:  ?   General: No focal deficit present.  ?   Mental Status: She is alert. Mental status is at baseline.  ? ? ?ED Results / Procedures / Treatments   ?Labs ?(all labs ordered are listed, but only abnormal results are displayed) ?Labs Reviewed - No data to display ? ?EKG ?None ? ?Radiology ?DG Wrist Complete Left ? ?Result Date: 06/02/2021 ?CLINICAL DATA:  Fall on  outstretched hand. Evaluate for wrist fracture. EXAM: LEFT WRIST - COMPLETE 3+ VIEW COMPARISON:  None. FINDINGS: There is an oblique comminuted moderately impacted fracture of the distal radial metaphysis with minimal medial apex angulation on frontal view and mild volar apex angulation on lateral view. Up to 2 mm cortical step-off of a dorsal fracture fragment on lateral view. Mildly displaced ulnar styloid acute fracture. Moderate triscaphe joint and mild thumb carpometacarpal joint space narrowing and peripheral osteophytosis. IMPRESSION: Acute, impacted, comminuted, and angulated fracture of the distal radial metaphysis. Electronically Signed   By: Yvonne Kendall M.D.   On: 06/02/2021 14:29   ? ?Procedures ?Procedures  ? ? ?Medications Ordered in ED ?Medications  ?oxyCODONE (Oxy IR/ROXICODONE) immediate release tablet 5 mg (has no administration in time range)  ? ? ?ED Course/ Medical Decision Making/ A&P ?  ?                        ?Medical Decision Making ?Amount and/or Complexity of Data Reviewed ?Radiology: ordered. ? ?Risk ?Prescription drug management. ? ? ?Patient is here with a reported mechanical fall on the outstretched left wrist, now tenderness of the wrist.  X-rays ordered and personally reviewed and interpreted, showing comminated left distal radius fracture.  Closed fx.  Neurovascularly intact.  ? ?Discussed with [facility], who discussed with Dr Caralyn Guile, who advised placing patient in splint and f/u in the office.  Information provided to patient and facility to arrange for this. ? ?No other traumatic injuries noted on exam.  Patient is stable for discharge back to facility ? ? ? ? ? ? ? ?Final Clinical Impression(s) / ED Diagnoses ?Final diagnoses:  ?Fall, initial encounter  ?Closed fracture of distal end of left radius, unspecified fracture morphology, initial encounter  ? ? ?Rx / DC Orders ?ED Discharge Orders   ? ?      Ordered  ?  oxyCODONE (ROXICODONE) 5 MG  immediate release tablet  Every 8 hours PRN       ? 06/02/21 1522  ? ?  ?  ? ?  ? ? ?  ?Wyvonnia Dusky, MD ?06/02/21 1542 ? ?

## 2021-06-02 NOTE — ED Triage Notes (Signed)
Patient presents to ED via [facility]  states she lost her balance yest approx. 830 pm and landed on her left hand, c/o pain left wrist and hand. No obv. Deformity Patient is alert orented to place and name. States she is at [facility] for rehab after hospitalization for cirrhosis   ?

## 2021-06-02 NOTE — ED Notes (Signed)
[facility] called pt has 27 people infront of them ?

## 2021-06-05 DIAGNOSIS — K769 Liver disease, unspecified: Secondary | ICD-10-CM | POA: Diagnosis not present

## 2021-06-05 DIAGNOSIS — I10 Essential (primary) hypertension: Secondary | ICD-10-CM | POA: Diagnosis not present

## 2021-06-05 DIAGNOSIS — F32A Depression, unspecified: Secondary | ICD-10-CM | POA: Diagnosis not present

## 2021-06-07 ENCOUNTER — Other Ambulatory Visit: Payer: Self-pay

## 2021-06-07 ENCOUNTER — Emergency Department (HOSPITAL_COMMUNITY): Payer: Medicare HMO

## 2021-06-07 ENCOUNTER — Observation Stay (HOSPITAL_COMMUNITY)
Admission: EM | Admit: 2021-06-07 | Discharge: 2021-06-10 | Disposition: A | Discharge status: Skilled Nursing Facility | Payer: Medicare HMO | Attending: Internal Medicine | Admitting: Internal Medicine

## 2021-06-07 ENCOUNTER — Observation Stay (HOSPITAL_COMMUNITY): Payer: Medicare HMO

## 2021-06-07 DIAGNOSIS — K7469 Other cirrhosis of liver: Secondary | ICD-10-CM

## 2021-06-07 DIAGNOSIS — I119 Hypertensive heart disease without heart failure: Secondary | ICD-10-CM | POA: Diagnosis not present

## 2021-06-07 DIAGNOSIS — Z6832 Body mass index (BMI) 32.0-32.9, adult: Secondary | ICD-10-CM | POA: Diagnosis not present

## 2021-06-07 DIAGNOSIS — I81 Portal vein thrombosis: Secondary | ICD-10-CM | POA: Diagnosis present

## 2021-06-07 DIAGNOSIS — K8020 Calculus of gallbladder without cholecystitis without obstruction: Secondary | ICD-10-CM | POA: Insufficient documentation

## 2021-06-07 DIAGNOSIS — R2689 Other abnormalities of gait and mobility: Secondary | ICD-10-CM | POA: Diagnosis not present

## 2021-06-07 DIAGNOSIS — E7849 Other hyperlipidemia: Secondary | ICD-10-CM

## 2021-06-07 DIAGNOSIS — R400 Somnolence: Secondary | ICD-10-CM

## 2021-06-07 DIAGNOSIS — R402411 Glasgow coma scale score 13-15, in the field [EMT or ambulance]: Secondary | ICD-10-CM | POA: Diagnosis not present

## 2021-06-07 DIAGNOSIS — D638 Anemia in other chronic diseases classified elsewhere: Secondary | ICD-10-CM | POA: Diagnosis not present

## 2021-06-07 DIAGNOSIS — Z8709 Personal history of other diseases of the respiratory system: Secondary | ICD-10-CM | POA: Insufficient documentation

## 2021-06-07 DIAGNOSIS — Z9181 History of falling: Secondary | ICD-10-CM | POA: Insufficient documentation

## 2021-06-07 DIAGNOSIS — R29818 Other symptoms and signs involving the nervous system: Secondary | ICD-10-CM | POA: Insufficient documentation

## 2021-06-07 DIAGNOSIS — R188 Other ascites: Secondary | ICD-10-CM | POA: Diagnosis not present

## 2021-06-07 DIAGNOSIS — J90 Pleural effusion, not elsewhere classified: Secondary | ICD-10-CM | POA: Diagnosis not present

## 2021-06-07 DIAGNOSIS — I700 Atherosclerosis of aorta: Secondary | ICD-10-CM | POA: Insufficient documentation

## 2021-06-07 DIAGNOSIS — Z79899 Other long term (current) drug therapy: Secondary | ICD-10-CM | POA: Insufficient documentation

## 2021-06-07 DIAGNOSIS — S52592D Other fractures of lower end of left radius, subsequent encounter for closed fracture with routine healing: Secondary | ICD-10-CM | POA: Insufficient documentation

## 2021-06-07 DIAGNOSIS — W19XXXD Unspecified fall, subsequent encounter: Secondary | ICD-10-CM | POA: Insufficient documentation

## 2021-06-07 DIAGNOSIS — R17 Unspecified jaundice: Secondary | ICD-10-CM

## 2021-06-07 DIAGNOSIS — K7460 Unspecified cirrhosis of liver: Secondary | ICD-10-CM | POA: Diagnosis not present

## 2021-06-07 DIAGNOSIS — D6959 Other secondary thrombocytopenia: Secondary | ICD-10-CM | POA: Diagnosis not present

## 2021-06-07 DIAGNOSIS — K7682 Hepatic encephalopathy: Principal | ICD-10-CM | POA: Diagnosis present

## 2021-06-07 DIAGNOSIS — E876 Hypokalemia: Secondary | ICD-10-CM | POA: Insufficient documentation

## 2021-06-07 DIAGNOSIS — Z66 Do not resuscitate: Secondary | ICD-10-CM | POA: Diagnosis present

## 2021-06-07 DIAGNOSIS — J948 Other specified pleural conditions: Secondary | ICD-10-CM | POA: Insufficient documentation

## 2021-06-07 DIAGNOSIS — Z8673 Personal history of transient ischemic attack (TIA), and cerebral infarction without residual deficits: Secondary | ICD-10-CM | POA: Diagnosis not present

## 2021-06-07 DIAGNOSIS — E46 Unspecified protein-calorie malnutrition: Secondary | ICD-10-CM | POA: Insufficient documentation

## 2021-06-07 DIAGNOSIS — Z6831 Body mass index (BMI) 31.0-31.9, adult: Secondary | ICD-10-CM | POA: Insufficient documentation

## 2021-06-07 DIAGNOSIS — R161 Splenomegaly, not elsewhere classified: Secondary | ICD-10-CM | POA: Diagnosis not present

## 2021-06-07 DIAGNOSIS — R296 Repeated falls: Secondary | ICD-10-CM | POA: Insufficient documentation

## 2021-06-07 DIAGNOSIS — I6523 Occlusion and stenosis of bilateral carotid arteries: Secondary | ICD-10-CM | POA: Diagnosis not present

## 2021-06-07 DIAGNOSIS — I10 Essential (primary) hypertension: Secondary | ICD-10-CM | POA: Insufficient documentation

## 2021-06-07 DIAGNOSIS — K7581 Nonalcoholic steatohepatitis (NASH): Secondary | ICD-10-CM | POA: Insufficient documentation

## 2021-06-07 DIAGNOSIS — E7220 Disorder of urea cycle metabolism, unspecified: Secondary | ICD-10-CM | POA: Diagnosis not present

## 2021-06-07 DIAGNOSIS — K766 Portal hypertension: Secondary | ICD-10-CM | POA: Diagnosis present

## 2021-06-07 DIAGNOSIS — Z8701 Personal history of pneumonia (recurrent): Secondary | ICD-10-CM | POA: Insufficient documentation

## 2021-06-07 DIAGNOSIS — Z4682 Encounter for fitting and adjustment of non-vascular catheter: Secondary | ICD-10-CM | POA: Diagnosis not present

## 2021-06-07 DIAGNOSIS — M6281 Muscle weakness (generalized): Secondary | ICD-10-CM | POA: Insufficient documentation

## 2021-06-07 DIAGNOSIS — S2231XA Fracture of one rib, right side, initial encounter for closed fracture: Secondary | ICD-10-CM | POA: Diagnosis not present

## 2021-06-07 DIAGNOSIS — I728 Aneurysm of other specified arteries: Secondary | ICD-10-CM | POA: Diagnosis not present

## 2021-06-07 DIAGNOSIS — Z593 Problems related to living in residential institution: Secondary | ICD-10-CM | POA: Insufficient documentation

## 2021-06-07 DIAGNOSIS — E119 Type 2 diabetes mellitus without complications: Secondary | ICD-10-CM | POA: Insufficient documentation

## 2021-06-07 DIAGNOSIS — R1310 Dysphagia, unspecified: Secondary | ICD-10-CM | POA: Insufficient documentation

## 2021-06-07 DIAGNOSIS — Z8719 Personal history of other diseases of the digestive system: Secondary | ICD-10-CM | POA: Diagnosis not present

## 2021-06-07 DIAGNOSIS — D696 Thrombocytopenia, unspecified: Secondary | ICD-10-CM | POA: Diagnosis present

## 2021-06-07 DIAGNOSIS — R4182 Altered mental status, unspecified: Secondary | ICD-10-CM | POA: Diagnosis not present

## 2021-06-07 DIAGNOSIS — R2681 Unsteadiness on feet: Secondary | ICD-10-CM | POA: Insufficient documentation

## 2021-06-07 LAB — COMPREHENSIVE METABOLIC PANEL
ALT: 34 U/L (ref 0–44)
AST: 55 U/L — ABNORMAL HIGH (ref 15–41)
Albumin: 2.4 g/dL — ABNORMAL LOW (ref 3.5–5.0)
Alkaline Phosphatase: 119 U/L (ref 38–126)
Anion gap: 5 (ref 5–15)
BUN: 16 mg/dL (ref 8–23)
CO2: 29 mmol/L (ref 22–32)
Calcium: 8.5 mg/dL — ABNORMAL LOW (ref 8.9–10.3)
Chloride: 104 mmol/L (ref 98–111)
Creatinine, Ser: 0.79 mg/dL (ref 0.44–1.00)
GFR, Estimated: >60 mL/min (ref >60)
Glucose, Bld: 134 mg/dL — ABNORMAL HIGH (ref 70–99)
Potassium: 4 mmol/L (ref 3.5–5.1)
Sodium: 138 mmol/L (ref 135–145)
Total Bilirubin: 2.7 mg/dL — ABNORMAL HIGH (ref 0.3–1.2)
Total Protein: 6.5 g/dL (ref 6.5–8.1)

## 2021-06-07 LAB — URINALYSIS, ROUTINE W REFLEX MICROSCOPIC
Bilirubin Urine: NEGATIVE
Glucose, UA: NEGATIVE mg/dL
Ketones, ur: NEGATIVE mg/dL
Leukocytes,Ua: NEGATIVE
Nitrite: NEGATIVE
Protein, ur: NEGATIVE mg/dL
Specific Gravity, Urine: 1.023 (ref 1.005–1.030)
pH: 7 (ref 5.0–8.0)

## 2021-06-07 LAB — DIFFERENTIAL
Abs Immature Granulocytes: 0.02 10*3/uL (ref 0.00–0.07)
Basophils Absolute: 0 10*3/uL (ref 0.0–0.1)
Basophils Relative: 1 %
Eosinophils Absolute: 0.2 10*3/uL (ref 0.0–0.5)
Eosinophils Relative: 3 %
Immature Granulocytes: 0 %
Lymphocytes Relative: 24 %
Lymphs Abs: 1.3 10*3/uL (ref 0.7–4.0)
Monocytes Absolute: 0.6 10*3/uL (ref 0.1–1.0)
Monocytes Relative: 12 %
Neutro Abs: 3.1 10*3/uL (ref 1.7–7.7)
Neutrophils Relative %: 60 %

## 2021-06-07 LAB — POCT I-STAT, CHEM 8
BUN: 17 mg/dL (ref 8–23)
Calcium, Ion: 1.15 mmol/L (ref 1.15–1.40)
Chloride: 101 mmol/L (ref 98–111)
Creatinine, Ser: 0.7 mg/dL (ref 0.44–1.00)
Glucose, Bld: 128 mg/dL — ABNORMAL HIGH (ref 70–99)
HCT: 35 % — ABNORMAL LOW (ref 36.0–46.0)
Hemoglobin: 11.9 g/dL — ABNORMAL LOW (ref 12.0–15.0)
Potassium: 4 mmol/L (ref 3.5–5.1)
Sodium: 140 mmol/L (ref 135–145)
TCO2: 29 mmol/L (ref 22–32)

## 2021-06-07 LAB — CBG MONITORING, ED: Glucose-Capillary: 123 mg/dL — ABNORMAL HIGH (ref 70–99)

## 2021-06-07 LAB — PROTIME-INR
INR: 1.8 — ABNORMAL HIGH (ref 0.8–1.2)
Prothrombin Time: 20.4 seconds — ABNORMAL HIGH (ref 11.4–15.2)

## 2021-06-07 LAB — CBC
HCT: 36 % (ref 36.0–46.0)
Hemoglobin: 12 g/dL (ref 12.0–15.0)
MCH: 31.6 pg (ref 26.0–34.0)
MCHC: 33.3 g/dL (ref 30.0–36.0)
MCV: 94.7 fL (ref 80.0–100.0)
Platelets: 64 10*3/uL — ABNORMAL LOW (ref 150–400)
RBC: 3.8 MIL/uL — ABNORMAL LOW (ref 3.87–5.11)
RDW: 17.2 % — ABNORMAL HIGH (ref 11.5–15.5)
WBC: 5.2 10*3/uL (ref 4.0–10.5)
nRBC: 0 % (ref 0.0–0.2)

## 2021-06-07 LAB — CK: Total CK: 63 U/L (ref 38–234)

## 2021-06-07 LAB — LACTIC ACID, PLASMA
Lactic Acid, Venous: 1.7 mmol/L (ref 0.5–1.9)
Lactic Acid, Venous: 1.9 mmol/L (ref 0.5–1.9)

## 2021-06-07 LAB — AMMONIA: Ammonia: 127 umol/L — ABNORMAL HIGH (ref 9–35)

## 2021-06-07 LAB — APTT: aPTT: 30 seconds (ref 24–36)

## 2021-06-07 LAB — POC OCCULT BLOOD, ED: Fecal Occult Bld: NEGATIVE

## 2021-06-07 MED ORDER — NADOLOL 20 MG PO TABS
20.0000 mg | ORAL_TABLET | Freq: Every day | ORAL | Status: DC
  Filled 2021-06-07: qty 1

## 2021-06-07 MED ORDER — SPIRONOLACTONE 25 MG PO TABS
100.0000 mg | ORAL_TABLET | Freq: Every day | ORAL | Status: DC
  Administered 2021-06-07: 100 mg
  Filled 2021-06-07: qty 1

## 2021-06-07 MED ORDER — LACTULOSE 10 GM/15ML PO SOLN
20.0000 g | Freq: Two times a day (BID) | ORAL | Status: DC
  Administered 2021-06-07: 20 g
  Filled 2021-06-07 (×2): qty 30

## 2021-06-07 MED ORDER — RIFAXIMIN 550 MG PO TABS
550.0000 mg | ORAL_TABLET | Freq: Two times a day (BID) | ORAL | Status: DC
  Administered 2021-06-07: 550 mg
  Filled 2021-06-07 (×2): qty 1

## 2021-06-07 MED ORDER — FUROSEMIDE 40 MG PO TABS
40.0000 mg | ORAL_TABLET | Freq: Every day | ORAL | Status: DC
  Administered 2021-06-07: 40 mg
  Filled 2021-06-07: qty 2

## 2021-06-07 MED ORDER — LACTULOSE 10 GM/15ML PO SOLN
30.0000 g | Freq: Once | ORAL | Status: AC
  Administered 2021-06-07: 30 g via ORAL
  Filled 2021-06-07: qty 60

## 2021-06-07 MED ORDER — IOHEXOL 350 MG/ML SOLN
75.0000 mL | Freq: Once | INTRAVENOUS | Status: AC | PRN
  Administered 2021-06-07: 75 mL via INTRAVENOUS

## 2021-06-07 MED ORDER — ONDANSETRON HCL 4 MG PO TABS: 4.0000 mg | ORAL_TABLET | Freq: Four times a day (QID) | ORAL | Status: DC | PRN

## 2021-06-07 MED ORDER — LACTULOSE ENEMA
300.0000 mL | Freq: Once | ORAL | Status: AC
  Administered 2021-06-07: 300 mL via RECTAL
  Filled 2021-06-07 (×2): qty 300

## 2021-06-07 MED ORDER — LACTATED RINGERS IV SOLN: INTRAVENOUS | Status: DC

## 2021-06-07 MED ORDER — RIFAXIMIN 550 MG PO TABS
550.0000 mg | ORAL_TABLET | Freq: Once | ORAL | Status: AC
  Administered 2021-06-07: 550 mg via ORAL
  Filled 2021-06-07: qty 1

## 2021-06-07 MED ORDER — ONDANSETRON HCL 4 MG/2ML IJ SOLN: 4.0000 mg | Freq: Four times a day (QID) | INTRAMUSCULAR | Status: DC | PRN

## 2021-06-07 MED ORDER — SODIUM CHLORIDE 0.9% FLUSH
3.0000 mL | Freq: Once | INTRAVENOUS | Status: AC
  Administered 2021-06-07: 3 mL via INTRAVENOUS

## 2021-06-07 NOTE — Hospital Course (Signed)
Courtney Braun is a 71 y.o. female with medical history significant for [name] cirrhosis complicated by esophageal varices with previous hemorrhage, chronic portal vein thrombosis, gastric renal shunt, ascites, hepatic hydrothorax, hepatic encephalopathy, chronic thrombocytopenia who is admitted with hepatic encephalopathy. ?

## 2021-06-07 NOTE — Progress Notes (Signed)
Pt is alert oriented x 2. NG tube noted in bed. Provider paged informed. Nurse completed bed side swallow test, no complications noted.  ?

## 2021-06-07 NOTE — ED Provider Notes (Addendum)
?[facility] ?Provider Note ? ? ?CSN: [id number] ?Arrival date & time:    ? ?An emergency department physician performed an initial assessment on this suspected stroke patient at 1. ? ?History ? ?Chief Complaint  ?Patient presents with  ? Altered Mental Status  ? ? ?Courtney Braun is a 71 y.o. female. ? ?HPI ?Patient presents for altered mental status.  Reportedly, she is conversant and ambulatory at baseline.  She is currently residing in a rehab facility due to generalized weakness.  She was reportedly normal this morning at 4 AM.  She was subsequently found minimally responsive.  EMS was called and patient arrives as a code stroke.  Per chart review, her medical history includes HTN, portal vein thrombosis, cirrhosis, T2DM, hepatic encephalopathy, history of esophageal varices, malnutrition, anemia.  She was seen in the emergency department 5 days ago following a fall.  She fell onto an outstretched left arm and sustained a comminuted left distal radius fracture.  Injury was splinted. ?  ? ?Home Medications ?Prior to Admission medications   ?Medication Sig Start Date End Date Taking? Authorizing Provider  ?acetaminophen (TYLENOL) 500 MG tablet Take 1,000 mg by mouth every 6 (six) hours as needed for moderate pain or headache.   Yes [provider]  ?cetirizine (ZYRTEC) 10 MG tablet Take 10 mg by mouth daily as needed for allergies.   Yes [provider]  ?ferrous sulfate 325 (65 FE) MG tablet Take 325 mg by mouth daily with breakfast.   Yes [provider]  ?furosemide (LASIX) 40 MG tablet Take 1 tablet (40 mg total) by mouth daily. 05/10/21  Yes Domenic Polite, MD  ?Lactulose 20 GM/30ML SOLN Take 20 mLs (13.3333 g total) by mouth daily. Titrate for 2-3 soft stools/day, increase dose to TID if needed ?Patient taking differently: Take 20 mLs by mouth daily. 05/10/21  Yes Domenic Polite, MD  ?melatonin 3 MG TABS tablet Take 1 tablet (3 mg total)  by mouth at bedtime as needed. ?Patient taking differently: Take 3 mg by mouth at bedtime as needed (sleep). 05/10/21  Yes Domenic Polite, MD  ?Multiple Vitamin (MULTIVITAMIN WITH MINERALS) TABS tablet Take 1 tablet by mouth daily.   Yes [provider]  ?nadolol (CORGARD) 20 MG tablet Take 1 tablet (20 mg total) by mouth daily. 05/11/21  Yes Domenic Polite, MD  ?nystatin (MYCOSTATIN/NYSTOP) powder Apply 1 application. topically 2 (two) times daily. Apply to groin and vaginal area   Yes [provider]  ?ondansetron (ZOFRAN ODT) 8 MG disintegrating tablet Take 1 tablet (8 mg total) by mouth every 8 (eight) hours as needed for nausea or vomiting. 11/19/20  Yes Dorie Rank, MD  ?oxyCODONE (ROXICODONE) 5 MG immediate release tablet Take 1 tablet (5 mg total) by mouth every 8 (eight) hours as needed for up to 12 doses for severe pain or breakthrough pain. 06/02/21  Yes Trifan, Carola Rhine, MD  ?pantoprazole (PROTONIX) 40 MG tablet Take 1 tablet (40 mg total) by mouth daily. 05/11/21  Yes Domenic Polite, MD  ?rifaximin (XIFAXAN) 550 MG TABS tablet Take 1 tablet (550 mg total) by mouth 2 (two) times daily. 05/10/21  Yes Domenic Polite, MD  ?sertraline (ZOLOFT) 50 MG tablet Take 50 mg by mouth daily.   Yes [provider]  ?simvastatin (ZOCOR) 40 MG tablet Take 40 mg by mouth at bedtime.    Yes [provider]  ?spironolactone (ALDACTONE) 100 MG tablet Take 1 tablet (100 mg total) by mouth  daily. 05/10/21  Yes Domenic Polite, MD  ?   ? ?Allergies    ?Metformin and related and Voltaren [diclofenac]   ? ?Review of Systems   ?Review of Systems  ?Unable to perform ROS: Mental status change  ? ?Physical Exam ?Updated Vital Signs ?BP (!) 155/75   Pulse 77   Temp 98.1 ?F (36.7 ?C) (Rectal)   Resp 13   Wt 107.6 kg   SpO2 100%   BMI 32.17 kg/m?  ?Physical Exam ?Constitutional:   ?   General: She is not in acute distress. ?   Appearance: She is normal weight. She is not toxic-appearing or  diaphoretic.  ?HENT:  ?   Head: Normocephalic and atraumatic.  ?   Right Ear: External ear normal.  ?   Left Ear: External ear normal.  ?   Nose: Nose normal.  ?   Mouth/Throat:  ?   Mouth: Mucous membranes are moist.  ?   Pharynx: Oropharynx is clear.  ?Eyes:  ?   Comments: Fixed gaze to the right  ?Pulmonary:  ?   Effort: Pulmonary effort is normal. No respiratory distress.  ?Abdominal:  ?   General: There is no distension.  ?   Palpations: Abdomen is soft.  ?   Tenderness: There is no abdominal tenderness.  ?Musculoskeletal:  ?   Cervical back: Neck supple.  ?   Right lower leg: No edema.  ?   Left lower leg: No edema.  ?   Comments: Left sugar-tong splint in place  ?Skin: ?   General: Skin is warm and dry.  ?Neurological:  ?   GCS: GCS eye subscore is 4. GCS verbal subscore is 4. GCS motor subscore is 6.  ?   Cranial Nerves: No facial asymmetry.  ?   Motor: Weakness (Global) present.  ? ? ?ED Results / Procedures / Treatments   ?Labs ?(all labs ordered are listed, but only abnormal results are displayed) ?Labs Reviewed  ?PROTIME-INR - Abnormal; Notable for the following components:  ?    Result Value  ? Prothrombin Time 20.4 (*)   ? INR 1.8 (*)   ? All other components within normal limits  ?CBC - Abnormal; Notable for the following components:  ? RBC 3.80 (*)   ? RDW 17.2 (*)   ? Platelets 64 (*)   ? All other components within normal limits  ?COMPREHENSIVE METABOLIC PANEL - Abnormal; Notable for the following components:  ? Glucose, Bld 134 (*)   ? Calcium 8.5 (*)   ? Albumin 2.4 (*)   ? AST 55 (*)   ? Total Bilirubin 2.7 (*)   ? All other components within normal limits  ?AMMONIA - Abnormal; Notable for the following components:  ? Ammonia 127 (*)   ? All other components within normal limits  ?URINALYSIS, ROUTINE W REFLEX MICROSCOPIC - Abnormal; Notable for the following components:  ? Color, Urine AMBER (*)   ? Hgb urine dipstick SMALL (*)   ? Bacteria, UA RARE (*)   ? All other components within normal  limits  ?CBG MONITORING, ED - Abnormal; Notable for the following components:  ? Glucose-Capillary 123 (*)   ? All other components within normal limits  ?POCT I-STAT, CHEM 8 - Abnormal; Notable for the following components:  ? Glucose, Bld 128 (*)   ? Hemoglobin 11.9 (*)   ? HCT 35.0 (*)   ? All other components within normal limits  ?URINE CULTURE  ?APTT  ?DIFFERENTIAL  ?LACTIC  ACID, PLASMA  ?CK  ?LACTIC ACID, PLASMA  ?I-STAT CHEM 8, ED  ?CBG MONITORING, ED  ?POC OCCULT BLOOD, ED  ? ? ?EKG ?None ? ?Radiology ?US Abdomen Complete ? ?Result Date: 06/07/2021 ?CLINICAL DATA:  Cirrhosis.  Ascites. EXAM: ABDOMEN ULTRASOUND COMPLETE COMPARISON:  None. FINDINGS: Gallbladder: 5 mm echogenic focus in the gallbladder is suspicious for a tiny gallstone. Evaluation is somewhat limited by incomplete gallbladder distention. No evidence of gallbladder wall thickening or pericholecystic fluid. No sonographic Murphy sign noted by sonographer. Common bile duct: Diameter: 2 mm, within normal limits. Liver: Diffusely coarsened echotexture and capsular nodularity, consistent with cirrhosis. No hepatic mass identified. Mild perihepatic ascites noted. Portal vein is patent on color Doppler imaging with normal direction of blood flow towards the liver. IVC: No abnormality visualized. Pancreas: Visualized portion unremarkable. Spleen: Mild splenomegaly, with length of approximately 13.4 cm. No splenic masses identified. Right Kidney: Length: 10.6 cm. Echogenicity within normal limits. No mass or hydronephrosis visualized. Left Kidney: Length: 10.4 cm. Echogenicity within normal limits. No mass or hydronephrosis visualized. Abdominal aorta: No aneurysm visualized. Other findings: Small right pleural effusion also noted. IMPRESSION: Hepatic cirrhosis, with mild perihepatic ascites and small right pleural effusion. No hepatic mass identified. Probable tiny gallstone. No sonographic evidence of cholecystitis or biliary ductal dilatation. Mild  splenomegaly, consistent with portal venous hypertension. Electronically Signed   By: Marlaine Hind M.D.   On: 06/07/2021 16:11  ? ?DG Chest Portable 1 View ? ?Result Date: 06/07/2021 ?CLINICAL DATA:  Altered mental sta

## 2021-06-07 NOTE — Assessment & Plan Note (Signed)
Patient with known chronic portal vein thrombosis.  Not on anticoagulation due to chronicity of thrombosis as well as high bleeding risk. ?

## 2021-06-07 NOTE — Assessment & Plan Note (Addendum)
Decompensated NASH hepatic cirrhosis ?History of esophageal varices with prior bleeding ?Portal hypertension ?Patient admitted with acute hepatic encephalopathy.  NG tube placed in ED to deliver medication safely.  MELD score on admission is 17. ?-Continue lactulose 20 g twice daily via NG tube, titrate to 2-3 bowel movements per day ?-Continue rifaximin 550 mg twice daily per tube ?-Continue home dose Lasix 40 mg and spironolactone 100 mg daily ?-Continue nadolol 20 mg daily ?-Obtain abdominal ultrasound ?

## 2021-06-07 NOTE — Assessment & Plan Note (Signed)
On prior admission patient expressed wishes for CODE STATUS to be DNR.  I discussed with her niece by phone who did confirm patient's wishes.  CODE STATUS remains DNR. ?

## 2021-06-07 NOTE — ED Triage Notes (Signed)
Pt from [facility]-where she is for rehab for weakness-as code stroke-LKW 0400 this morning when she walked to the bathroom and interacted appropriately with staff. When staff delivered her meal tray at 0830 she was found to be non verbal, unable to follow commands, with a R sided gaze.  ?

## 2021-06-07 NOTE — H&P (Signed)
?History and Physical  ? ? ?SATIN BOAL XLK:[id number] DOB: [birth year] DOA: 06/07/2021 ? ?PCP: Mayra Neer, MD  ?Patient coming from: SNF ? ?I have personally briefly reviewed patient's old medical records in [place] ? ?Chief Complaint: Altered mental status ? ?HPI: ?NAASIA WEILBACHER is a 71 y.o. female with medical history significant for [name] cirrhosis complicated by esophageal varices with previous hemorrhage, chronic portal vein thrombosis, gastric renal shunt, ascites, hepatic hydrothorax, hepatic encephalopathy, chronic thrombocytopenia who presented to the ED from SNF for evaluation of altered mental status.  History is limited from patient due to altered mental status and is otherwise obtained by EDP and chart review. ? ?Patient recently admitted 04/27/2021-05/10/2021.  Initially presented with increasing abdominal distention and found to have a right-sided hepatic hydrothorax.  She underwent thoracentesis on 2/10 with 1.5 L removed.  Patient subsequently developed respiratory distress and septic shock due to healthcare associated pneumonia.  She required transfer to ICU with antibiotics and pressors, intubated on 2/11, extubated on 2/16.  Patient was diuresed with Lasix and Aldactone with improvement in volume status.  She was subsequently discharged to SNF. ? ?Per the documentation, patient was reportedly normal at 0700 this morning at her facility.  She was subsequently found minimally responsive around 0800.  She had significant decline from her baseline which is reportedly verbally interactive and ambulatory.  EMS were called and she was brought to the ED for further evaluation. ? ?At time of admitting exam, patient has NG tube in place.  She is very somnolent and only briefly arouses to verbal and light tactile stimuli.  She is not able to provide any further history. ? ?ED Course  Labs/Imaging on admission: I have personally reviewed following labs and imaging studies. ? ?Initial vitals  showed BP 148/71, pulse 68, RR 17, temp 97.7 ?F, SPO2 97% on room air. ? ?Labs show sodium 138, potassium 4.0, bicarb 29, BUN 16, creatinine 0.79, serum glucose 134, AST 55, ALT 34, alk phos 119, total bilirubin 2.7, WBC 5.2, hemoglobin 12.0, platelets 64,000, INR 1.8, ammonia 127.  FOBT is negative.  CK 63. ? ?Urinalysis shows negative nitrates, negative leukocytes, 11-20 RBCs, 0-5 WBCs, rare bacteria microscopy.  Urine culture in process. ? ?Patient initially arrived as a code stroke with neurology consultation.  CT head was negative for acute intracranial hemorrhage or infarct.  CTA head/neck negative for emergent LVO.  Minimal plaque of the carotid bifurcations and intracranial ICAs noted without significant stenosis.  Moderate size right pleural effusion seen, decrease in size from prior. ? ?Code stroke subsequently canceled.  Portable chest x-ray showed patchy bilateral airspace disease, right > left.  NG tube was placed.  Follow-up abdominal x-ray showed enteric tube coiled in the gastric antrum. ? ?Patient was given oral and rectal lactulose, IV fluids.  The hospitalist service was consulted to admit for further evaluation and management. ? ?Review of Systems: All systems reviewed and are negative except as documented in history of present illness above. ? ? ?Past Medical History:  ?Diagnosis Date  ? Anemia   ? Diabetes mellitus without complication ([place])   ? type II  ? History of blood transfusion   ? Hyperlipidemia   ? Hypertension   ? ? ?Past Surgical History:  ?Procedure Laterality Date  ? ESOPHAGOGASTRODUODENOSCOPY N/A 08/06/2016  ? Procedure: ESOPHAGOGASTRODUODENOSCOPY (EGD);  Surgeon: Clarene Essex, MD;  Location: [facility];  Service: Endoscopy;  Laterality: N/A;  ? ESOPHAGOGASTRODUODENOSCOPY (EGD) WITH PROPOFOL N/A 05/09/2018  ? Procedure: ESOPHAGOGASTRODUODENOSCOPY (EGD)  WITH PROPOFOL;  Surgeon: Clarene Essex, MD;  Location: [facility];  Service: Endoscopy;  Laterality: N/A;  ?  ESOPHAGOGASTRODUODENOSCOPY (EGD) WITH PROPOFOL N/A 11/04/2018  ? Procedure: ESOPHAGOGASTRODUODENOSCOPY (EGD) WITH PROPOFOL;  Surgeon: Clarene Essex, MD;  Location: [facility];  Service: Endoscopy;  Laterality: N/A;  ? GASTRIC VARICES BANDING N/A 05/09/2018  ? Procedure: GASTRIC VARICES BANDING;  Surgeon: Clarene Essex, MD;  Location: [facility];  Service: Endoscopy;  Laterality: N/A;  ? IR THORACENTESIS ASP PLEURAL SPACE W/IMG GUIDE  04/28/2021  ? none    ? TONSILLECTOMY    ? ? ?Social History: ? reports that she has never smoked. She has never used smokeless tobacco. She reports current alcohol use. She reports that she does not use drugs. ? ?Allergies  ?Allergen Reactions  ? Metformin And Related   ?  Upset stomach  ? Voltaren [Diclofenac]   ?  diarrhea  ? ? ?Family History  ?Problem Relation Age of Onset  ? Hyperlipidemia Other   ? ? ? ?Prior to Admission medications   ?Medication Sig Start Date End Date Taking? Authorizing Provider  ?acetaminophen (TYLENOL) 500 MG tablet Take 1,000 mg by mouth every 6 (six) hours as needed for moderate pain or headache.   Yes [provider]  ?cetirizine (ZYRTEC) 10 MG tablet Take 10 mg by mouth daily as needed for allergies.   Yes [provider]  ?ferrous sulfate 325 (65 FE) MG tablet Take 325 mg by mouth daily with breakfast.   Yes [provider]  ?furosemide (LASIX) 40 MG tablet Take 1 tablet (40 mg total) by mouth daily. 05/10/21  Yes Domenic Polite, MD  ?Lactulose 20 GM/30ML SOLN Take 20 mLs (13.3333 g total) by mouth daily. Titrate for 2-3 soft stools/day, increase dose to TID if needed ?Patient taking differently: Take 20 mLs by mouth daily. 05/10/21  Yes Domenic Polite, MD  ?melatonin 3 MG TABS tablet Take 1 tablet (3 mg total) by mouth at bedtime as needed. ?Patient taking differently: Take 3 mg by mouth at bedtime as needed (sleep). 05/10/21  Yes Domenic Polite, MD  ?Multiple Vitamin (MULTIVITAMIN WITH MINERALS) TABS tablet Take 1 tablet by  mouth daily.   Yes [provider]  ?nadolol (CORGARD) 20 MG tablet Take 1 tablet (20 mg total) by mouth daily. 05/11/21  Yes Domenic Polite, MD  ?nystatin (MYCOSTATIN/NYSTOP) powder Apply 1 application. topically 2 (two) times daily. Apply to groin and vaginal area   Yes [provider]  ?ondansetron (ZOFRAN ODT) 8 MG disintegrating tablet Take 1 tablet (8 mg total) by mouth every 8 (eight) hours as needed for nausea or vomiting. 11/19/20  Yes Dorie Rank, MD  ?oxyCODONE (ROXICODONE) 5 MG immediate release tablet Take 1 tablet (5 mg total) by mouth every 8 (eight) hours as needed for up to 12 doses for severe pain or breakthrough pain. 06/02/21  Yes Trifan, Carola Rhine, MD  ?pantoprazole (PROTONIX) 40 MG tablet Take 1 tablet (40 mg total) by mouth daily. 05/11/21  Yes Domenic Polite, MD  ?rifaximin (XIFAXAN) 550 MG TABS tablet Take 1 tablet (550 mg total) by mouth 2 (two) times daily. 05/10/21  Yes Domenic Polite, MD  ?sertraline (ZOLOFT) 50 MG tablet Take 50 mg by mouth daily.   Yes [provider]  ?simvastatin (ZOCOR) 40 MG tablet Take 40 mg by mouth at bedtime.    Yes [provider]  ?spironolactone (ALDACTONE) 100 MG tablet Take 1 tablet (100 mg total) by mouth daily. 05/10/21  Yes Domenic Polite,  MD  ? ? ?Physical Exam: ?Vitals:  ? 06/07/21 1200 06/07/21 1212 06/07/21 1215 06/07/21 1230  ?BP: (!) 168/97  (!) 159/89 (!) 145/73  ?Pulse: 76  71 70  ?Resp: 15  16 19   ?Temp:  98.1 ?F (36.7 ?C)    ?TempSrc:  Rectal    ?SpO2: 96%  98% 97%  ?Weight:      ?Exam limited due to hepatic encephalopathy. ?Constitutional: Chronically ill-appearing woman resting in bed with head elevated, somnolent and minimally interactive ?Eyes: PERRL, lids and conjunctivae normal ?ENMT: Mucous membranes are dry.  NG tube in right nares ?Neck: normal, supple, no masses. ?Respiratory: clear to auscultation anteriorly. No accessory muscle use.  ?Cardiovascular: Regular rate and rhythm, no murmurs / rubs /  gallops. No extremity edema. 2+ pedal pulses. ?Abdomen: no obvious tenderness elicited to palpation, Bowel sounds positive.  ?Musculoskeletal: no clubbing / cyanosis. No joint deformity upper and lower extremities.  ?Skin: Dry.

## 2021-06-07 NOTE — Assessment & Plan Note (Signed)
Chronic thrombocytopenia secondary to hepatic cirrhosis.  No obvious bleeding on admission.  Hold pharmacologic VTE prophylaxis. ?

## 2021-06-07 NOTE — H&P (Addendum)
Neurology Consult H&P ? ?Courtney Braun ?MR# [id number] ?06/07/2021 ? ? ?CC: altered mental status ? ?History is obtained from: EMS and chart. ? ?HPI: Courtney Braun is a 71 y.o. female PMHx as reviewed below lives in a facility and was seen normally interacting ~0700. Around 0800 she was noted to be altered and not responding at her baseline and EMS was called. ? ?At the scene, BP 182/92 ? ?LKW: 39030 ?tNK given: No not clearly stroke ?IR Thrombectomy No, no LVO ?Modified Rankin Scale: 1-No significant post stroke disability and can perform usual duties with stroke symptoms ?NIHSS: 19 ? ?LOC Responsiveness 0 ?LOC Questions 2 ?LOC Commands 2 ?Horizontal eye movement 0 ?Visual field 0 ?Facial palsy 0 ?Motor arm - Right arm 3 ?Motor arm - Left arm 3 ?Motor leg - Right leg 3 ?Motor leg - Left leg 3 ?Limb ataxia 0 ?Sensory test 0 ?Language 3 ?Speech 0 ?Extinction and inattention 0 ? ?ROS: Unable to assess due to encephalopathy. ? ?Past Medical History:  ?Diagnosis Date  ? Anemia   ? Diabetes mellitus without complication ([place])   ? type II  ? History of blood transfusion   ? Hyperlipidemia   ? Hypertension   ? ? ?Family History  ?Problem Relation Age of Onset  ? Hyperlipidemia Other   ? ? ?Social History:  reports that she has never smoked. She has never used smokeless tobacco. She reports current alcohol use. She reports that she does not use drugs. ? ? ?Prior to Admission medications   ?Medication Sig Start Date End Date Taking? Authorizing Provider  ?acetaminophen (TYLENOL) 500 MG tablet Take 1,000 mg by mouth every 6 (six) hours as needed for moderate pain or headache.    [provider]  ?cetirizine (ZYRTEC) 10 MG tablet Take 10 mg by mouth daily as needed for allergies.    [provider]  ?ferrous sulfate 325 (65 FE) MG tablet Take 325 mg by mouth daily with breakfast.    [provider]  ?furosemide (LASIX) 40 MG tablet Take 1 tablet (40 mg total) by mouth daily. 05/10/21   Domenic Polite, MD  ?Lactulose 20 GM/30ML SOLN Take 20 mLs (13.3333 g total) by mouth daily. Titrate for 2-3 soft stools/day, increase dose to TID if needed 05/10/21   Domenic Polite, MD  ?melatonin 3 MG TABS tablet Take 1 tablet (3 mg total) by mouth at bedtime as needed. 05/10/21   Domenic Polite, MD  ?Multiple Vitamin (MULTIVITAMIN WITH MINERALS) TABS tablet Take 1 tablet by mouth daily.    [provider]  ?nadolol (CORGARD) 20 MG tablet Take 1 tablet (20 mg total) by mouth daily. 05/11/21   Domenic Polite, MD  ?ondansetron (ZOFRAN ODT) 8 MG disintegrating tablet Take 1 tablet (8 mg total) by mouth every 8 (eight) hours as needed for nausea or vomiting. 11/19/20   Dorie Rank, MD  ?oxyCODONE (ROXICODONE) 5 MG immediate release tablet Take 1 tablet (5 mg total) by mouth every 8 (eight) hours as needed for up to 12 doses for severe pain or breakthrough pain. 06/02/21   Wyvonnia Dusky, MD  ?pantoprazole (PROTONIX) 40 MG tablet Take 1 tablet (40 mg total) by mouth daily. 05/11/21   Domenic Polite, MD  ?rifaximin (XIFAXAN) 550 MG TABS tablet Take 1 tablet (550 mg total) by mouth 2 (two) times daily. 05/10/21   Domenic Polite, MD  ?sertraline (ZOLOFT) 50 MG tablet Take 50 mg by mouth daily.    [provider]  ?simvastatin (  ZOCOR) 40 MG tablet Take 40 mg by mouth at bedtime.     [provider]  ?spironolactone (ALDACTONE) 100 MG tablet Take 1 tablet (100 mg total) by mouth daily. 05/10/21   Domenic Polite, MD  ? ? ?Exam: ?Current vital signs: ?Wt 107.6 kg   BMI 32.17 kg/m?  ? ?Physical Exam  ?Constitutional: Appears well-developed and well-nourished.  ?Psych: Flat, restricted affect. ?Eyes: No scleral injection ?HENT: No OP obstruction. Sublingual jaundice ?Head: Normocephalic.  ?Cardiovascular: Normal rate and regular rhythm.  ?Respiratory: Effort normal, symmetric excursions bilaterally, no audible wheezing. ?GI: Soft.  No distension. There is no tenderness.  ?Skin: skin seems  jaundiced. ? ?Neuro: ?Mental Status: ?Patient is awake, alert, oriented to person ?She is nonverbal except for being able to answer yes no questions. ?Speech fluent, intact comprehension and repetition. ?No signs of neglect. ?Visual Fields are full to confrontation. Pupils are equal, round, and reactive to light. ?EOMI without ptosis or diplopia.  ?Facial sensation is symmetric to temperature ?Facial movement is symmetric.  ?Hearing is intact to voice. ?Uvula midline and palate elevates symmetrically. ?Shoulder shrug is symmetric. ?Tongue is midline without atrophy or fasciculations.  ?Tone is normal. Bulk is normal. Inconsistently moves extremities semi-purposefully. ?Sensation is symmetric to light touch and temperature in the arms and legs. ?Deep Tendon Reflexes: 2+ and symmetric in the biceps and patellae. ?Babinski (+) bilaterally. ?FNF and HKS are intact bilaterally. ?Gait - Deferred ? ?I have reviewed labs in epic and the pertinent results are: ?INR 1.8 ? ?I have reviewed the images obtained: ?NCT head showed no acute ischemic changes, hemorrhage or masses. ?CTA head and neck showed no LVO or significant flow limiting stenosis. ? ?CTA neck also shows moderate pleural effusion in right upper lobe which seems to be slightly improved compared to CT abdomen pelvis 04/30/2020. ? ?Assessment: Courtney Braun is a 71 y.o. female PMHx DM 2, portal HTN, portal vein thrombosis, esophageal varices, right pleural effusion, cirrhosis, hepatic encephalopathy, recently treated for septic shock 4 weeks ago with acute encephalopathy. Patient has paucity of words and is able to move all extremities except for her left arm which is bandaged. Symptoms are not currently suggestive of stroke and more likely secondary to other comorbid disease. Skin is mildly jaundiced and there is sublingual jaundice suggesting setting of hepatic encephalopathy.  ? ?Moderate pleural effusion in right upper lobe seen on CTA neck which has mildly  improved as compared to CT abdomen 04/30/2021. ? ?Impression:  ?Acute hepatic encephalopathy ?Cirrhosis ?Right pleural effusion ?Portal HTN ?Portal vein thrombosis ?Jaundice ?HTN ?HLD ? ? ?Please cancel code stroke. ? ?Plan: ?- MRI brain without contrast when stable - If abnormality please call neurology. ?- Recommend metabolic/infectious workup with CBC, CMP, UA with UCx, CXR, CK, serum lactate. ?- Currently does not seem to be seizing however if there is suspicion for seizure, EEG and please call neurology. ?- Neurology will remain available, please call for questions. ? ?This patient is critically ill and at significant risk of neurological worsening, death and care requires constant monitoring of vital signs, hemodynamics,respiratory and cardiac monitoring, neurological assessment, discussion with family, other specialists and medical decision making of high complexity. I spent 75 minutes of neurocritical care time  in the care of  this patient. This was time spent independent of any time provided by nurse practitioner or PA. ? ?Electronically signed by:  ?Lynnae Sandhoff, MD ?Page: [phone] ?06/07/2021, 9:45 AM ? ?If 7pm- 7am, please page neurology on call as listed in  [facility]. ? ?

## 2021-06-08 DIAGNOSIS — K7581 Nonalcoholic steatohepatitis (NASH): Secondary | ICD-10-CM | POA: Diagnosis not present

## 2021-06-08 DIAGNOSIS — K7460 Unspecified cirrhosis of liver: Secondary | ICD-10-CM

## 2021-06-08 DIAGNOSIS — D696 Thrombocytopenia, unspecified: Secondary | ICD-10-CM | POA: Diagnosis not present

## 2021-06-08 DIAGNOSIS — E7220 Disorder of urea cycle metabolism, unspecified: Secondary | ICD-10-CM

## 2021-06-08 DIAGNOSIS — K766 Portal hypertension: Secondary | ICD-10-CM

## 2021-06-08 DIAGNOSIS — K7682 Hepatic encephalopathy: Secondary | ICD-10-CM | POA: Diagnosis not present

## 2021-06-08 LAB — COMPREHENSIVE METABOLIC PANEL
ALT: 31 U/L (ref 0–44)
AST: 52 U/L — ABNORMAL HIGH (ref 15–41)
Albumin: 2.1 g/dL — ABNORMAL LOW (ref 3.5–5.0)
Alkaline Phosphatase: 91 U/L (ref 38–126)
Anion gap: 3 — ABNORMAL LOW (ref 5–15)
BUN: 16 mg/dL (ref 8–23)
CO2: 29 mmol/L (ref 22–32)
Calcium: 8.5 mg/dL — ABNORMAL LOW (ref 8.9–10.3)
Chloride: 107 mmol/L (ref 98–111)
Creatinine, Ser: 0.67 mg/dL (ref 0.44–1.00)
GFR, Estimated: >60 mL/min (ref >60)
Glucose, Bld: 115 mg/dL — ABNORMAL HIGH (ref 70–99)
Potassium: 3.5 mmol/L (ref 3.5–5.1)
Sodium: 139 mmol/L (ref 135–145)
Total Bilirubin: 3.3 mg/dL — ABNORMAL HIGH (ref 0.3–1.2)
Total Protein: 5.5 g/dL — ABNORMAL LOW (ref 6.5–8.1)

## 2021-06-08 LAB — PROTIME-INR
INR: 2.1 — ABNORMAL HIGH (ref 0.8–1.2)
Prothrombin Time: 24 seconds — ABNORMAL HIGH (ref 11.4–15.2)

## 2021-06-08 LAB — CBC
HCT: 29.8 % — ABNORMAL LOW (ref 36.0–46.0)
Hemoglobin: 10.4 g/dL — ABNORMAL LOW (ref 12.0–15.0)
MCH: 31.9 pg (ref 26.0–34.0)
MCHC: 34.9 g/dL (ref 30.0–36.0)
MCV: 91.4 fL (ref 80.0–100.0)
Platelets: 68 10*3/uL — ABNORMAL LOW (ref 150–400)
RBC: 3.26 MIL/uL — ABNORMAL LOW (ref 3.87–5.11)
RDW: 16.9 % — ABNORMAL HIGH (ref 11.5–15.5)
WBC: 7.1 10*3/uL (ref 4.0–10.5)
nRBC: 0 % (ref 0.0–0.2)

## 2021-06-08 LAB — AMMONIA: Ammonia: 90 umol/L — ABNORMAL HIGH (ref 9–35)

## 2021-06-08 LAB — URINE CULTURE: Culture: NO GROWTH

## 2021-06-08 MED ORDER — SPIRONOLACTONE 25 MG PO TABS
100.0000 mg | ORAL_TABLET | Freq: Every day | ORAL | Status: DC
Start: 2021-06-08 — End: 2021-06-10
  Administered 2021-06-08 – 2021-06-10 (×3): 100 mg via ORAL
  Filled 2021-06-08 (×3): qty 4

## 2021-06-08 MED ORDER — FUROSEMIDE 40 MG PO TABS
40.0000 mg | ORAL_TABLET | Freq: Every day | ORAL | Status: DC
  Administered 2021-06-08 – 2021-06-10 (×3): 40 mg via ORAL
  Filled 2021-06-08 (×3): qty 1

## 2021-06-08 MED ORDER — RIFAXIMIN 550 MG PO TABS
550.0000 mg | ORAL_TABLET | Freq: Two times a day (BID) | ORAL | Status: DC
  Administered 2021-06-08 – 2021-06-10 (×5): 550 mg via ORAL
  Filled 2021-06-08 (×5): qty 1

## 2021-06-08 MED ORDER — NADOLOL 20 MG PO TABS
20.0000 mg | ORAL_TABLET | Freq: Every day | ORAL | Status: DC
  Administered 2021-06-08 – 2021-06-10 (×3): 20 mg via ORAL
  Filled 2021-06-08 (×3): qty 1

## 2021-06-08 MED ORDER — LACTULOSE 10 GM/15ML PO SOLN
20.0000 g | Freq: Two times a day (BID) | ORAL | Status: DC
  Administered 2021-06-08: 20 g via ORAL

## 2021-06-08 MED ORDER — LACTULOSE 10 GM/15ML PO SOLN
30.0000 g | Freq: Three times a day (TID) | ORAL | Status: DC
  Administered 2021-06-08 – 2021-06-10 (×4): 30 g via ORAL
  Filled 2021-06-08 (×4): qty 60

## 2021-06-08 NOTE — Progress Notes (Signed)
Received order to give PO meds.  ?

## 2021-06-08 NOTE — Care Management Obs Status (Signed)
MEDICARE OBSERVATION STATUS NOTIFICATION ? ? ?Patient Details  ?Name: Courtney Braun ?MRN: [id number] ?Date of Birth: [birth year] ? ? ?Medicare Observation Status Notification Given:  Yes ? ? ? ?Geralynn Ochs, LCSW ?06/08/2021, 3:10 PM ?

## 2021-06-08 NOTE — Evaluation (Signed)
Occupational Therapy Evaluation ?Patient Details ?Name: Courtney Braun ?MRN: [id number] ?DOB: 12-20-1950 ?Today's Date: 06/08/2021 ? ? ?History of Present Illness  71 yo female presenting to ED on 3/22 with AMS from SNF rehab. Head CT negative for acute change. Pt with recent admission 04/27/2021-05/10/2021 and s/p thoracentesis on 2/10. Additionally, sustained a fall at SNF on 3/17 resulting in L distal radius fx which was splinted and then she returned to SNF. PMH including HTN, portal vein thrombosis, cirrhosis, T2DM, hepatic encephalopathy, history of esophageal varices, malnutrition, and anemia. ?  ? ?Clinical Impression ?  ?PTA, pt was at SNF for rehab and received assistance for ADLs and transfer to w/c; walking with PT at rehab.  Pt currently requiring Min A for UB ADLs, Mod-Max A for LB ADLs, and Mod A +2 for functional mobility. Pt presenting with decreased balance, strength, and activity tolerance. Pt would benefit from further acute OT to facilitate safe dc. Recommend dc to SNF for further OT to optimize safety, independence with ADLs, and return to PLOF.  ?   ? ?Recommendations for follow up therapy are one component of a multi-disciplinary discharge planning process, led by the attending physician.  Recommendations may be updated based on patient status, additional functional criteria and insurance authorization.  ? ?Follow Up Recommendations ? Skilled nursing-short term rehab (<3 hours/day)  ?  ?Assistance Recommended at Discharge Frequent or constant Supervision/Assistance  ?Patient can return home with the following A lot of help with walking and/or transfers;A lot of help with bathing/dressing/bathroom ? ?  ?Functional Status Assessment ? Patient has had a recent decline in their functional status and demonstrates the ability to make significant improvements in function in a reasonable and predictable amount of time.  ?Equipment Recommendations ? None recommended by OT  ?  ?Recommendations for Other  Services PT consult ? ? ?  ?Precautions / Restrictions Precautions ?Precautions: Fall  ? ?  ? ?Mobility Bed Mobility ?Overal bed mobility: Needs Assistance ?Bed Mobility: Supine to Sit ?  ?  ?Supine to sit: Min guard, HOB elevated ?  ?  ?General bed mobility comments: MIn Guard A for safety and use of bed rail ?  ? ?Transfers ?Overall transfer level: Needs assistance ?Equipment used: 2 person hand held assist ?Transfers: Sit to/from Stand ?Sit to Stand: Mod assist, +2 physical assistance ?  ?  ?  ?  ?  ?General transfer comment: Mod A for power up and weight shift forward into standing ?  ? ?  ?Balance Overall balance assessment: Needs assistance ?Sitting-balance support: No upper extremity supported, Feet supported ?Sitting balance-Leahy Scale: Fair ?  ?  ?Standing balance support: Bilateral upper extremity supported, During functional activity ?Standing balance-Leahy Scale: Poor ?  ?  ?  ?  ?  ?  ?  ?  ?  ?  ?  ?  ?   ? ?ADL either performed or assessed with clinical judgement  ? ?ADL Overall ADL's : Needs assistance/impaired ?Eating/Feeding: Set up;Sitting ?  ?Grooming: Oral care;Minimal assistance;Moderate assistance;Standing ?Grooming Details (indicate cue type and reason): Min A for bilateral coordination due to limited pinch strength at digits. Min-Mod A for standing balance ?Upper Body Bathing: Moderate assistance;Sitting ?  ?Lower Body Bathing: Moderate assistance;Sit to/from stand ?  ?Upper Body Dressing : Moderate assistance;Sitting ?  ?Lower Body Dressing: Maximal assistance;Sit to/from stand ?  ?Toilet Transfer: Moderate assistance;+2 for physical assistance;Ambulation (simulated to recliner) ?Toilet Transfer Details (indicate cue type and reason): ModA for power up and weight shift  forward into standing. Mod A for gait ?  ?  ?  ?  ?Functional mobility during ADLs: Moderate assistance;+2 for physical assistance ?General ADL Comments: Decreased balance, strength, and functional uee of LUE  ? ? ? ?Vision    ?   ?   ?Perception   ?  ?Praxis   ?  ? ?Pertinent Vitals/Pain Pain Assessment ?Pain Assessment: No/denies pain  ? ? ? ?Hand Dominance Right ?  ?Extremity/Trunk Assessment Upper Extremity Assessment ?Upper Extremity Assessment: LUE deficits/detail ?LUE Deficits / Details: L prior wrist fx. Limited finger extension and noted edema ?LUE Coordination: decreased fine motor;decreased gross motor ?  ?Lower Extremity Assessment ?Lower Extremity Assessment: Defer to PT evaluation ?  ?Cervical / Trunk Assessment ?Cervical / Trunk Assessment: Kyphotic ?  ?Communication Communication ?Communication: No difficulties ?  ?Cognition Arousal/Alertness: Awake/alert ?Behavior During Therapy: [facility] for tasks assessed/performed ?Overall Cognitive Status: Impaired/Different from baseline ?Area of Impairment: Problem solving, Awareness, Following commands, Safety/judgement ?  ?  ?  ?  ?  ?  ?  ?  ?  ?  ?  ?Following Commands: Follows one step commands with increased time ?Safety/Judgement: Decreased awareness of deficits, Decreased awareness of safety ?Awareness: Emergent ?Problem Solving: Slow processing, Requires verbal cues ?General Comments: Following one step commands with increased time. Pt able to name 2/3 animals that start with a "c" (pt reporting cat and cougar and then stating moutain lion). Pt then requiring Max cues to think of one more animal. Poor awareness when her legas began to fatigue ?  ?  ?General Comments  niece present at 32 of session ? ?  ?Exercises   ?  ?Shoulder Instructions    ? ? ?Home Living Family/patient expects to be discharged to:: Skilled nursing facility ?  ?  ?  ?  ?  ?  ?  ?  ?  ?  ?  ?  ?  ?  ?  ?  ?Additional Comments: Recently at [facility] for rehab ?  ? ?  ?Prior Functioning/Environment Prior Level of Function : Independent/Modified Independent;Driving ?  ?  ?  ?  ?  ?  ?Mobility Comments: Using w/c since recent fall. Walking with therapists ?ADLs Comments: Staff assist with ADLs since  recent fall ?  ? ?  ?  ?OT Problem List: Decreased strength;Decreased range of motion;Decreased activity tolerance;Impaired balance (sitting and/or standing);Decreased knowledge of use of DME or AE;Decreased knowledge of precautions ?  ?   ?OT Treatment/Interventions: Self-care/ADL training;Therapeutic exercise;Energy conservation;DME and/or AE instruction;Therapeutic activities;Patient/family education  ?  ?OT Goals(Current goals can be found in the care plan section) Acute Rehab OT Goals ?Patient Stated Goal: Go home ?OT Goal Formulation: With patient ?Time For Goal Achievement: 06/22/21 ?Potential to Achieve Goals: Good  ?OT Frequency: Min 2X/week ?  ? ?Co-evaluation   ?  ?  ?  ?  ? ?  ?AM-PAC OT "6 Clicks" Daily Activity     ?Outcome Measure Help from another person eating meals?: A Little ?Help from another person taking care of personal grooming?: A Lot ?Help from another person toileting, which includes using toliet, bedpan, or urinal?: A Lot ?Help from another person bathing (including washing, rinsing, drying)?: A Lot ?Help from another person to put on and taking off regular upper body clothing?: A Little ?Help from another person to put on and taking off regular lower body clothing?: A Lot ?6 Click Score: 14 ?  ?End of Session Equipment Utilized During Treatment: Gait belt ?Nurse Communication: Mobility  status ? ?Activity Tolerance: Patient tolerated treatment well ?Patient left: in chair;with call bell/phone within reach ? ?OT Visit Diagnosis: Unsteadiness on feet (R26.81);Other abnormalities of gait and mobility (R26.89);Muscle weakness (generalized) (M62.81)  ?              ?Time: 7939-6886 ?OT Time Calculation (min): 15 min ?Charges:  OT General Charges ?$OT Visit: 1 Visit ?OT Evaluation ?$OT Eval Moderate Complexity: 1 Mod ? ?Nhung Danko MSOT, OTR/L ?Acute Rehab ?Pager: [phone] ?Office: [phone] ? ?Gavin Telford M Jarquis Walker ?06/08/2021, 12:26 PM ?

## 2021-06-08 NOTE — Progress Notes (Signed)
? ? ? [facility] ?                                                                            ? ? ?Courtney Braun, is a 71 y.o. female, DOB - 03/20/1950, HUT:[id number] ?Admit date - 06/07/2021    ?Outpatient Primary MD for the patient is Courtney Neer, MD ? ?LOS - 0  days ? ? ? ?Brief summary  ? ?Courtney Braun is a 71 y.o. female with medical history significant for Courtney Braun cirrhosis complicated by esophageal varices with previous hemorrhage, chronic portal vein thrombosis, gastric renal shunt, ascites, hepatic hydrothorax, hepatic encephalopathy, chronic thrombocytopenia who is admitted with hepatic encephalopathy. ? ? ?Assessment & Plan  ? ? ?Assessment and Plan: ?* Acute hepatic encephalopathy ?Decompensated NASH hepatic cirrhosis ?History of esophageal varices with prior bleeding ?Portal hypertension ?Patient admitted with acute hepatic encephalopathy.  NG tube placed in ED to deliver medication safely.   ?She removed the NG tube.  ?Ammonia level improved from 127 to 90.  ?Increased lactulose to 30 mg BID.  ?Target 2 to 3 bowel movements per day.  ?No source of infection found.  ?She is more alert and able to answer simple questions.  ?She will need another 1 to 2 days for the ammonia levels to clear .  ? ? ? ?Thrombocytopenia ([place]) ?Chronic thrombocytopenia secondary to hepatic cirrhosis.  No obvious bleeding on admission.   ?Hold pharmacologic VTE prophylaxis. ? ?Thrombosis, portal vein ?Chronic portal vein thrombosis. No anti coagulation due to recurrent falls.  ? ? ?Recurrent falls leading to Acute, impacted, comminuted, and angulated fracture of the distal ?radial metaphysis. ?Therapy evaluations ordered.  ?She has ortho appt scheduled for tomorrow, which will need to be post poned.  ? ?DNR (do not resuscitate) ?On prior admission patient expressed wishes for CODE STATUS to be DNR.  I discussed with her niece by phone who did confirm patient's wishes.  CODE STATUS remains  DNR. ? ? ? ? ? ?Estimated body mass index is 31.6 kg/m? as calculated from the following: ?  Height as of 06/02/21: 6' (1.829 m). ?  Weight as of this encounter: 105.7 kg. ? ?Code Status: DNR ?DVT Prophylaxis:  SCDs Start: 06/07/21 1352 ? ? ?Level of Care: Level of care: Med-Surg ?Family Communication: Updated patient's niece over the phone.  ? ?Disposition Plan:     Remains inpatient appropriate:  acute hepatic encephalopathy.  ? ?Procedures:  ?None.  ? ?Consultants:   ?None.  ? ?Antimicrobials:  ? ?Anti-infectives (From admission, onward)  ? ? Start     Dose/Rate Route Frequency Ordered Stop  ? 06/08/21 1015  rifaximin (XIFAXAN) tablet 550 mg       ? 550 mg Oral 2 times daily 06/08/21 0920    ? 06/07/21 2200  rifaximin (XIFAXAN) tablet 550 mg  Status:  Discontinued       ? 550 mg Per Tube 2 times daily 06/07/21 1355 06/08/21 0920  ? 06/07/21 1245  rifaximin (XIFAXAN) tablet 550 mg       ? 550 mg Oral  Once 06/07/21 1234 06/07/21 1314  ? ?  ? ? ? ?Medications ? ?Scheduled Meds: ? furosemide  40 mg Oral Daily  ?  lactulose  30 g Oral TID  ? nadolol  20 mg Oral Daily  ? rifaximin  550 mg Oral BID  ? spironolactone  100 mg Oral Daily  ? ?Continuous Infusions: ?PRN Meds:.ondansetron **OR** ondansetron (ZOFRAN) IV ? ? ? ?Subjective:  ? ?Courtney Braun was seen and examined today.  She is alert and answering all questions appropriately, denies any pain.  ? ?Objective:  ? ?Vitals:  ? 06/08/21 0500 06/08/21 0745 06/08/21 1128 06/08/21 1550  ?BP:  (!) 142/64 (!) 124/48 (!) 118/55  ?Pulse:  87 81 72  ?Resp:  16 16 16   ?Temp:  98.2 ?F (36.8 ?C) 98.1 ?F (36.7 ?C) 98.1 ?F (36.7 ?C)  ?TempSrc:  Oral Oral Oral  ?SpO2:  95% 97% 96%  ?Weight: 105.7 kg     ? ? ?Intake/Output Summary (Last 24 hours) at 06/08/2021 1554 ?Last data filed at 06/07/2021 1953 ?Gross per 24 hour  ?Intake --  ?Output 750 ml  ?Net -750 ml  ? ?Filed Weights  ? 06/07/21 0900 06/08/21 0500  ?Weight: 107.6 kg 105.7 kg  ? ? ? ?Exam. ?General exam: Appears calm and  comfortable  ?Respiratory system: Clear to auscultation. Respiratory effort normal. ?Cardiovascular system: S1 & S2 heard, RRR. No JVD,  No pedal edema. ?Gastrointestinal system: Abdomen is nondistended, soft and nontender.  Normal bowel sounds heard. ?Central nervous system: Alert and oriented. No focal neurological deficits. ?Extremities: Symmetric 5 x 5 power. ?Skin: No rashes, lesions or ulcers ?Psychiatry: Mood & affect appropriate.  ? ? ?Data Reviewed:  I have personally reviewed following labs and imaging studies ? ? ?CBC ?Lab Results  ?Component Value Date  ? WBC 7.1 06/08/2021  ? RBC 3.26 (L) 06/08/2021  ? HGB 10.4 (L) 06/08/2021  ? HCT 29.8 (L) 06/08/2021  ? MCV 91.4 06/08/2021  ? MCH 31.9 06/08/2021  ? PLT 68 (L) 06/08/2021  ? MCHC 34.9 06/08/2021  ? RDW 16.9 (H) 06/08/2021  ? LYMPHSABS 1.3 06/07/2021  ? MONOABS 0.6 06/07/2021  ? EOSABS 0.2 06/07/2021  ? BASOSABS 0.0 06/07/2021  ? ? ? ?Last metabolic panel ?Lab Results  ?Component Value Date  ? NA 139 06/08/2021  ? K 3.5 06/08/2021  ? CL 107 06/08/2021  ? CO2 29 06/08/2021  ? BUN 16 06/08/2021  ? CREATININE 0.67 06/08/2021  ? GLUCOSE 115 (H) 06/08/2021  ? GFRNONAA >60 06/08/2021  ? CALCIUM 8.5 (L) 06/08/2021  ? PHOS 4.2 05/01/2021  ? PROT 5.5 (L) 06/08/2021  ? ALBUMIN 2.1 (L) 06/08/2021  ? BILITOT 3.3 (H) 06/08/2021  ? ALKPHOS 91 06/08/2021  ? AST 52 (H) 06/08/2021  ? ALT 31 06/08/2021  ? ANIONGAP 3 (L) 06/08/2021  ? ? ?CBG (last 3)  ?Recent Labs  ?  06/07/21 ?0934  ?GLUCAP 123*  ?  ? ? ?Coagulation Profile: ?Recent Labs  ?Lab 06/07/21 ?2409 06/08/21 ?0134  ?INR 1.8* 2.1*  ? ? ? ?Radiology Studies: ?US Abdomen Complete ? ?Result Date: 06/07/2021 ?CLINICAL DATA:  Cirrhosis.  Ascites. EXAM: ABDOMEN ULTRASOUND COMPLETE COMPARISON:  None. FINDINGS: Gallbladder: 5 mm echogenic focus in the gallbladder is suspicious for a tiny gallstone. Evaluation is somewhat limited by incomplete gallbladder distention. No evidence of gallbladder wall thickening or  pericholecystic fluid. No sonographic Murphy sign noted by sonographer. Common bile duct: Diameter: 2 mm, within normal limits. Liver: Diffusely coarsened echotexture and capsular nodularity, consistent with cirrhosis. No hepatic mass identified. Mild perihepatic ascites noted. Portal vein is patent on color Doppler imaging with normal direction of blood flow  towards the liver. IVC: No abnormality visualized. Pancreas: Visualized portion unremarkable. Spleen: Mild splenomegaly, with length of approximately 13.4 cm. No splenic masses identified. Right Kidney: Length: 10.6 cm. Echogenicity within normal limits. No mass or hydronephrosis visualized. Left Kidney: Length: 10.4 cm. Echogenicity within normal limits. No mass or hydronephrosis visualized. Abdominal aorta: No aneurysm visualized. Other findings: Small right pleural effusion also noted. IMPRESSION: Hepatic cirrhosis, with mild perihepatic ascites and small right pleural effusion. No hepatic mass identified. Probable tiny gallstone. No sonographic evidence of cholecystitis or biliary ductal dilatation. Mild splenomegaly, consistent with portal venous hypertension. Electronically Signed   By: Marlaine Hind M.D.   On: 06/07/2021 16:11  ? ?DG Chest Portable 1 View ? ?Result Date: 06/07/2021 ?CLINICAL DATA:  Altered mental status.  Rule out stroke EXAM: PORTABLE CHEST 1 VIEW COMPARISON:  04/29/2021 FINDINGS: Cardiac enlargement. Patchy bilateral airspace disease has improved. Minimal right pleural effusion. Chronic right rib fractures IMPRESSION: Cardiac enlargement. Patchy bilateral airspace disease right greater than left. This is likely edema. Overall improvement since 04/29/2021. Electronically Signed   By: Franchot Gallo M.D.   On: 06/07/2021 11:44  ? ?DG Abd Portable 1 View ? ?Result Date: 06/07/2021 ?CLINICAL DATA:  Enteric tube placement. EXAM: PORTABLE ABDOMEN - 1 VIEW COMPARISON:  Abdominal x-ray dated April 29, 2021. FINDINGS: Enteric tube coiled in the  gastric antrum. Normal bowel gas pattern. Multiple gallstones again noted. Small calcified splenic artery aneurysm again noted. Excreted contrast within both renal collecting systems. IMPRESSION: 1. Enteric tube coiled in the gastric

## 2021-06-08 NOTE — Plan of Care (Signed)

## 2021-06-08 NOTE — [place] FL2 (Signed)
?[place] MEDICAID FL2 LEVEL OF CARE SCREENING TOOL  ?  ? ?IDENTIFICATION  ?Patient Name: ?Courtney Braun Birthdate: [birth year] Sex: female Admission Date (Current Location): ?06/07/2021  ?[place] and [place] Number: ? [place] ?  Facility and Address:  ?[facility], [place] [address] [place], [place] [address] ?     Provider Number: ?[id number]  ?Attending Physician Name and Address:  ?Hosie Poisson, MD ? Relative Name and Phone Number:  ?  ?   ?Current Level of Care: ?Hospital Recommended Level of Care: ?[place] Prior Approval Number: ?  ? ?Date Approved/Denied: ?  PASRR Number: ?  ? ?Discharge Plan: ?SNF ?  ? ?Current Diagnoses: ?Patient Active Problem List  ? Diagnosis Date Noted  ? Acute hepatic encephalopathy 06/07/2021  ? DNR (do not resuscitate) 06/07/2021  ? Septic shock ([place]) 05/09/2021  ? Thrombosis, portal vein 05/09/2021  ? HE (hepatic encephalopathy) 05/09/2021  ? Anemia of chronic disease 05/09/2021  ? Type 2 diabetes mellitus with complication, without long-term current use of insulin ([place]) 05/09/2021  ? Thrombocytopenia ([place]) 05/09/2021  ? Malnutrition of moderate degree 05/01/2021  ? Acute respiratory failure with hypoxia ([place]) 04/29/2021  ? Portal hypertension ([place]) 04/28/2021  ? Ascites 04/28/2021  ? Benign essential HTN 04/28/2021  ? Hx of esophageal varices 04/28/2021  ? Liver cirrhosis secondary to NASH ([place]) 04/27/2021  ? Pleural effusion, right   ? ? ?Orientation RESPIRATION BLADDER Height & Weight   ?  ?Self, Time, Place ? Normal Incontinent Weight: 233 lb 0.4 oz (105.7 kg) ?Height:     ?BEHAVIORAL SYMPTOMS/MOOD NEUROLOGICAL BOWEL NUTRITION STATUS  ?    Incontinent Diet (see DC summary)  ?AMBULATORY STATUS COMMUNICATION OF NEEDS Skin   ?Extensive Assist Verbally Normal ?  ?  ?  ?    ?     ?     ? ? ?Personal Care Assistance Level of Assistance  ?Bathing, Feeding, Dressing Bathing Assistance: Limited assistance ?Feeding assistance:  Independent ?Dressing Assistance: Limited assistance ?   ? ?Functional Limitations Info  ?    ?  ?   ? ? ?SPECIAL CARE FACTORS FREQUENCY  ?PT (By licensed PT), OT (By licensed OT)   ?  ?PT Frequency: 5x/wk ?OT Frequency: 5x/wk ?  ?  ?  ?   ? ? ?Contractures Contractures Info: Not present  ? ? ?Additional Factors Info  ?Code Status, Allergies Code Status Info: DNR ?Allergies Info: Metformin And Related, Voltaren (Diclofenac) ?  ?  ?  ?   ? ?Current Medications (06/08/2021):  This is the current hospital active medication list ?Current Facility-Administered Medications  ?Medication Dose Route Frequency Provider Last Rate Last Admin  ? furosemide (LASIX) tablet 40 mg  40 mg Oral Daily Hosie Poisson, MD   40 mg at 06/08/21 1111  ? lactulose (CHRONULAC) 10 GM/15ML solution 30 g  30 g Oral TID Hosie Poisson, MD      ? nadolol (CORGARD) tablet 20 mg  20 mg Oral Daily Hosie Poisson, MD   20 mg at 06/08/21 1111  ? ondansetron (ZOFRAN) tablet 4 mg  4 mg Oral Q6H PRN Lenore Cordia, MD      ? Or  ? ondansetron (ZOFRAN) injection 4 mg  4 mg Intravenous Q6H PRN Lenore Cordia, MD      ? rifaximin Doreene Nest) tablet 550 mg  550 mg Oral BID Hosie Poisson, MD   550 mg at 06/08/21 1111  ? spironolactone (ALDACTONE) tablet 100 mg  100 mg Oral Daily Hosie Poisson, MD   100 mg at 06/08/21 1111  ? ? ? ?Discharge Medications: ?Please see discharge summary for a list of discharge medications. ? ?Relevant Imaging Results: ? ?Relevant Lab Results: ? ? ?Additional Information ?SS#: [id number] ? ?Geralynn Ochs, LCSW ? ? ? ? ?

## 2021-06-08 NOTE — Evaluation (Signed)
Physical Therapy Evaluation ?Patient Details ?Name: Courtney Braun ?MRN: [id number] ?DOB: Dec 09, 1950 ?Today's Date: 06/08/2021 ? ?History of Present Illness ? The pt is a 71 yo female presenting 3/22 with AMS after being found minimally responsive at rehab facility. Pt admitted originally 2/9-2/22 due to abdominal pain, septic shock, and PNA. The pt was d/c to SNF and sustained a fall there on 3/17 resulting in L distal radius fx which was splinted and then she returned to SNF. PMH includes: HTN, portal vein thrombosis, cirrhosis, T2DM, hepatic encephalopathy, history of esophageal varices, malnutrition, and anemia. ?  ?Clinical Impression ? Pt in bed upon arrival of PT, agreeable to evaluation at this time. Prior to admission the pt was working with therapies at SNF, but states she required assist for ADLs and was mostly mobilizing with a WC outside of therapy sessions. The pt now presents with limitations in functional mobility, strength, endurance, stability, and power due to above dx, and will continue to benefit from skilled PT to address these deficits. The pt required modA of 2 to stand due to poor functional balance and LE strength and power. She was then able to take 1-2 steps with modA of 1, but requires assist of 2 to safely ambulate short distance in the room at this time. The pt was limited to ~2 min static standing with single UE support due to fatigue in BLE. Given deficits, will recommend return to SNF for continued rehab once medically stable.  ?   ?   ? ?Recommendations for follow up therapy are one component of a multi-disciplinary discharge planning process, led by the attending physician.  Recommendations may be updated based on patient status, additional functional criteria and insurance authorization. ? ?Follow Up Recommendations Skilled nursing-short term rehab (<3 hours/day) ? ?  ?Assistance Recommended at Discharge Frequent or constant Supervision/Assistance  ?Patient can return home with  the following ? A lot of help with walking and/or transfers;A lot of help with bathing/dressing/bathroom;Assistance with cooking/housework;Direct supervision/assist for medications management;Direct supervision/assist for financial management;Assist for transportation;Help with stairs or ramp for entrance ? ?  ?Equipment Recommendations None recommended by PT  ?Recommendations for Other Services ?    ?  ?Functional Status Assessment Patient has had a recent decline in their functional status and demonstrates the ability to make significant improvements in function in a reasonable and predictable amount of time.  ? ?  ?Precautions / Restrictions Precautions ?Precautions: Fall  ? ?  ? ?Mobility ? Bed Mobility ?Overal bed mobility: Needs Assistance ?Bed Mobility: Supine to Sit ?  ?  ?Supine to sit: Min guard, HOB elevated ?  ?  ?General bed mobility comments: MIn Guard A for safety and use of bed rail ?  ? ?Transfers ?Overall transfer level: Needs assistance ?Equipment used: 2 person hand held assist ?Transfers: Sit to/from Stand ?Sit to Stand: Mod assist, +2 physical assistance ?  ?  ?  ?  ?  ?General transfer comment: Mod A for power up and weight shift forward into standing ?  ? ?Ambulation/Gait ?Ambulation/Gait assistance: Min assist, +2 physical assistance, Mod assist ?Gait Distance (Feet): 10 Feet ?Assistive device: 2 person hand held assist ?Gait Pattern/deviations: Step-through pattern, Decreased stride length, Staggering left, Staggering right ?Gait velocity: decreased ?Gait velocity interpretation: <1.31 ft/sec, indicative of household ambulator ?  ?General Gait Details: pt with significant instability, leaning to L initially then modA of 2 to take steps to sink. shaky steps with lateral lean ? ?  ? ?Balance Overall balance assessment:  Needs assistance ?Sitting-balance support: No upper extremity supported, Feet supported ?Sitting balance-Leahy Scale: Fair ?  ?  ?Standing balance support: Bilateral upper  extremity supported, During functional activity ?Standing balance-Leahy Scale: Poor ?Standing balance comment: limited to <2 min. shaky legs with slight bouncing in standing. ?  ?  ?  ?  ?  ?  ?  ?  ?  ?  ?  ?   ? ? ? ?Pertinent Vitals/Pain Pain Assessment ?Pain Assessment: No/denies pain  ? ? ?Home Living Family/patient expects to be discharged to:: Skilled nursing facility ?  ?  ?  ?  ?  ?  ?  ?  ?  ?Additional Comments: Recently at [facility] for rehab  ?  ?Prior Function Prior Level of Function : Independent/Modified Independent;Driving ?  ?  ?  ?  ?  ?  ?Mobility Comments: Using w/c since recent fall. Walking with therapists ?ADLs Comments: Staff assist with ADLs since recent fall ?  ? ? ?Hand Dominance  ? Dominant Hand: Right ? ?  ?Extremity/Trunk Assessment  ? Upper Extremity Assessment ?Upper Extremity Assessment: Defer to OT evaluation;LUE deficits/detail ?LUE Deficits / Details: L prior wrist fx. Limited finger extension and noted edema ?LUE Coordination: decreased fine motor;decreased gross motor ?  ? ?Lower Extremity Assessment ?Lower Extremity Assessment: Generalized weakness (grossly functional against gravity, limited endurance, unable to maintain standing for > 2 min) ?  ? ?Cervical / Trunk Assessment ?Cervical / Trunk Assessment: Kyphotic  ?Communication  ? Communication: No difficulties  ?Cognition Arousal/Alertness: Awake/alert ?Behavior During Therapy: [facility] for tasks assessed/performed ?Overall Cognitive Status: Impaired/Different from baseline ?Area of Impairment: Problem solving, Awareness, Following commands, Safety/judgement ?  ?  ?  ?  ?  ?  ?  ?  ?  ?  ?  ?Following Commands: Follows one step commands with increased time ?Safety/Judgement: Decreased awareness of deficits, Decreased awareness of safety ?Awareness: Emergent ?Problem Solving: Slow processing, Requires verbal cues ?General Comments: Following one step commands with increased time. Pt able to name 2/3 animals that start with a  "c" (pt reporting cat and cougar and then stating moutain lion). Pt then requiring Max cues to think of one more animal. Poor awareness when her legas began to fatigue ?  ?  ? ?  ?General Comments General comments (skin integrity, edema, etc.): neice present at start of session ? ?  ?   ? ?Assessment/Plan  ?  ?PT Assessment Patient needs continued PT services  ?PT Problem List Decreased strength;Decreased balance;Decreased activity tolerance ? ?   ?  ?PT Treatment Interventions DME instruction;Gait training;Stair training;Functional mobility training;Therapeutic exercise;Therapeutic activities;Balance training;Patient/family education   ? ?PT Goals (Current goals can be found in the Care Plan section)  ?Acute Rehab PT Goals ?Patient Stated Goal: return to rehab ?PT Goal Formulation: With patient ?Time For Goal Achievement: 06/22/21 ?Potential to Achieve Goals: Good ? ?  ?Frequency Min 2X/week ?  ? ? ?Co-evaluation PT/OT/SLP Co-Evaluation/Treatment: Yes ?Reason for Co-Treatment: Complexity of the patient's impairments (multi-system involvement);For patient/therapist safety;To address functional/ADL transfers ?PT goals addressed during session: Mobility/safety with mobility;Balance ?  ?  ? ? ?  ?AM-PAC PT "6 Clicks" Mobility  ?Outcome Measure Help needed turning from your back to your side while in a flat bed without using bedrails?: A Little ?Help needed moving from lying on your back to sitting on the side of a flat bed without using bedrails?: A Little ?Help needed moving to and from a bed to a chair (including a wheelchair)?:  A Lot ?Help needed standing up from a chair using your arms (e.g., wheelchair or bedside chair)?: A Lot ?Help needed to walk in hospital room?: A Lot ?Help needed climbing 3-5 steps with a railing? : Total ?6 Click Score: 13 ? ?  ?End of Session Equipment Utilized During Treatment: Gait belt ?Activity Tolerance: Patient tolerated treatment well ?Patient left: in chair;with call bell/phone  within reach ?Nurse Communication: Mobility status ?PT Visit Diagnosis: Other abnormalities of gait and mobility (R26.89);Muscle weakness (generalized) (M62.81);History of falling (Z91.81) ?  ? ?Time: 1137-11

## 2021-06-08 NOTE — Evaluation (Signed)
Clinical/Bedside Swallow Evaluation ?Patient Details  ?Name: Courtney Braun ?MRN: [id number] ?Date of Birth: 1950/11/12 ? ?Today's Date: 06/08/2021 ?Time: SLP Start Time (ACUTE ONLY): 2440 SLP Stop Time (ACUTE ONLY): 1101 ?SLP Time Calculation (min) (ACUTE ONLY): 16 min ? ?Past Medical History:  ?Past Medical History:  ?Diagnosis Date  ? Anemia   ? Diabetes mellitus without complication ([place])   ? type II  ? History of blood transfusion   ? Hyperlipidemia   ? Hypertension   ? ?Past Surgical History:  ?Past Surgical History:  ?Procedure Laterality Date  ? ESOPHAGOGASTRODUODENOSCOPY N/A 08/06/2016  ? Procedure: ESOPHAGOGASTRODUODENOSCOPY (EGD);  Surgeon: Clarene Essex, MD;  Location: [facility];  Service: Endoscopy;  Laterality: N/A;  ? ESOPHAGOGASTRODUODENOSCOPY (EGD) WITH PROPOFOL N/A 05/09/2018  ? Procedure: ESOPHAGOGASTRODUODENOSCOPY (EGD) WITH PROPOFOL;  Surgeon: Clarene Essex, MD;  Location: [facility];  Service: Endoscopy;  Laterality: N/A;  ? ESOPHAGOGASTRODUODENOSCOPY (EGD) WITH PROPOFOL N/A 11/04/2018  ? Procedure: ESOPHAGOGASTRODUODENOSCOPY (EGD) WITH PROPOFOL;  Surgeon: Clarene Essex, MD;  Location: [facility];  Service: Endoscopy;  Laterality: N/A;  ? GASTRIC VARICES BANDING N/A 05/09/2018  ? Procedure: GASTRIC VARICES BANDING;  Surgeon: Clarene Essex, MD;  Location: [facility];  Service: Endoscopy;  Laterality: N/A;  ? IR THORACENTESIS ASP PLEURAL SPACE W/IMG GUIDE  04/28/2021  ? none    ? TONSILLECTOMY    ? ?HPI:  ?Pt is a 71 y.o. female who presented to the ED with AMS. NGT placed 3/22 due to somnolence. CT head negative. [place] failed and then subsequently passed 3/22. CXR 3/22: Patchy bilateral airspace disease right greater than left. This is likely edema. Overall improvement since 04/29/2021. Swallow evaluation requested 3/23. PMH: Courtney Braun cirrhosis complicated by esophageal varices with previous hemorrhage, chronic portal vein thrombosis, gastric renal shunt, ascites, hepatic hydrothorax, hepatic  encephalopathy, chronic thrombocytopenia.  ?  ?Assessment / Plan / Recommendation  ?Clinical Impression ? Pt was seen for bedside swallow evaluation with her niece present for part of the evaluation. Both parties denied the pt having any history of oropharyngeal dysphagia and reported intake of regular texture solids at baseline. Oral mechanism exam was [facility] and she presented with adequate, natural dentition. She tolerated all solids and liquids without signs or symptoms of oropharyngeal dysphagia. Pt's diet will be advanced to regular texture solids with thin liquids. SLP will follow briefly to ensure tolerance. ?SLP Visit Diagnosis: Dysphagia, unspecified (R13.10) ?   ?Aspiration Risk ? No limitations  ?  ?Diet Recommendation Regular;Thin liquid  ? ?Liquid Administration via: Cup;Straw ?Medication Administration: Whole meds with puree (per pt's request) ?Supervision: Patient able to self feed (set up necessary) ?Compensations: Slow rate;Small sips/bites ?Postural Changes: Seated upright at 90 degrees  ?  ?Other  Recommendations Oral Care Recommendations: Oral care BID   ? ?Recommendations for follow up therapy are one component of a multi-disciplinary discharge planning process, led by the attending physician.  Recommendations may be updated based on patient status, additional functional criteria and insurance authorization. ? ?Follow up Recommendations No SLP follow up  ? ? ?  ?Assistance Recommended at Discharge None  ?Functional Status Assessment Patient has not had a recent decline in their functional status  ?Frequency and Duration min 1 x/week  ?1 week ?  ?   ? ?Prognosis    ? ?  ? ?Swallow Study   ?General Date of Onset: 06/07/21 ?HPI: Pt is a 71 y.o. female who presented to the ED with AMS. NGT placed 3/22 due to somnolence. CT head negative. Yale failed and then  subsequently passed 3/22. CXR 3/22: Patchy bilateral airspace disease right greater than left. This is likely edema. Overall improvement since  04/29/2021. Swallow evaluation requested 3/23. PMH: Courtney Braun cirrhosis complicated by esophageal varices with previous hemorrhage, chronic portal vein thrombosis, gastric renal shunt, ascites, hepatic hydrothorax, hepatic encephalopathy, chronic thrombocytopenia. ?Type of Study: Bedside Swallow Evaluation ?Previous Swallow Assessment: none ?Diet Prior to this Study: Regular;Thin liquids ?Temperature Spikes Noted: No ?Respiratory Status: Room air ?History of Recent Intubation: No ?Behavior/Cognition: Alert;Pleasant mood;Cooperative ?Oral Cavity Assessment: Within Functional Limits ?Oral Care Completed by SLP: No ?Oral Cavity - Dentition: Adequate natural dentition ?Vision: Functional for self-feeding ?Self-Feeding Abilities: Able to feed self ?Patient Positioning: Upright in bed;Postural control adequate for testing ?Baseline Vocal Quality: Normal ?Volitional Cough: Strong ?Volitional Swallow: Able to elicit  ?  ?Oral/Motor/Sensory Function Overall Oral Motor/Sensory Function: Within functional limits   ?Ice Chips Ice chips: Within functional limits   ?Thin Liquid Thin Liquid: Within functional limits ?Presentation: Straw;Cup  ?  ?Nectar Thick Nectar Thick Liquid: Not tested   ?Honey Thick Honey Thick Liquid: Not tested   ?Puree Puree: Within functional limits ?Presentation: Spoon   ?Solid ? ? ?  Solid: Within functional limits ?Presentation: Self Fed  ? ?  ?Rebeca Valdivia I. Hardin Negus, [place], CCC-SLP ?Acute Rehabilitation Services ?Office number [phone] ?Pager [phone] ? ?Horton Marshall ?06/08/2021,11:11 AM ? ? ? ? ? ?

## 2021-06-08 NOTE — TOC Initial Note (Signed)
Transition of Care (TOC) - Initial/Assessment Note  ? ? ?Patient Details  ?Name: Courtney Braun ?MRN: [id number] ?Date of Birth: January 19, 1951 ? ?Transition of Care [facility]) CM/SW Contact:    ?Geralynn Ochs, LCSW ?Phone Number: ?06/08/2021, 3:08 PM ? ?Clinical Narrative:       CSW met with patient to discuss return to [name]. Patient agreeable to return, hopeful to return soon. CSW confirmed that patient can return to [name] and asked [name] to initiate insurance authorization request. CSW to follow.          ? ? ?Expected Discharge Plan: [place] ?Barriers to Discharge: Continued Medical Work up, Ship broker ? ? ?Patient Goals and CMS Choice ?Patient states their goals for this hospitalization and ongoing recovery are:: get back to [name] for rehab ?CMS Medicare.gov Compare Post Acute Care list provided to:: Patient ?Choice offered to / list presented to : Patient ? ?Expected Discharge Plan and Services ?Expected Discharge Plan: [place] ?  ?  ?Post Acute Care Choice: [place] ?Living arrangements for the past 2 months: [place] ?                ?  ?  ?  ?  ?  ?  ?  ?  ?  ?  ? ?Prior Living Arrangements/Services ?Living arrangements for the past 2 months: [place] ?  ?Patient language and need for interpreter reviewed:: No ?Do you feel safe going back to the place where you live?: Yes      ?Need for Family Participation in Patient Care: No (Comment) ?Care giver support system in place?: No (comment) ?  ?Criminal Activity/Legal Involvement Pertinent to Current Situation/Hospitalization: No - Comment as needed ? ?Activities of Daily Living ?  ?  ? ?Permission Sought/Granted ?Permission sought to share information with : Customer service manager ?Permission granted to share information with : Yes, Verbal Permission Granted ?   ? Permission granted to share info w AGENCY: [name] ?   ?   ? ?Emotional  Assessment ?Appearance:: Appears stated age ?Attitude/Demeanor/Rapport: Engaged ?Affect (typically observed): Appropriate ?Orientation: : Oriented to Self, Oriented to Place, Oriented to  Time ?Alcohol / Substance Use: Not Applicable ?Psych Involvement: No (comment) ? ?Admission diagnosis:  Acute hepatic encephalopathy [K76.82] ?Patient Active Problem List  ? Diagnosis Date Noted  ? Acute hepatic encephalopathy 06/07/2021  ? DNR (do not resuscitate) 06/07/2021  ? Septic shock ([place]) 05/09/2021  ? Thrombosis, portal vein 05/09/2021  ? HE (hepatic encephalopathy) 05/09/2021  ? Anemia of chronic disease 05/09/2021  ? Type 2 diabetes mellitus with complication, without long-term current use of insulin ([place]) 05/09/2021  ? Thrombocytopenia ([place]) 05/09/2021  ? Malnutrition of moderate degree 05/01/2021  ? Acute respiratory failure with hypoxia ([place]) 04/29/2021  ? Portal hypertension ([place]) 04/28/2021  ? Ascites 04/28/2021  ? Benign essential HTN 04/28/2021  ? Hx of esophageal varices 04/28/2021  ? Liver cirrhosis secondary to NASH ([place]) 04/27/2021  ? Pleural effusion, right   ? ?PCP:  Mayra Neer, MD ?Pharmacy:  No Pharmacies Listed ? ? ? ?Social Determinants of Health (SDOH) Interventions ?  ? ?Readmission Risk Interventions ?   ? View : No data to display.  ?  ?  ?  ? ? ? ?

## 2021-06-08 NOTE — Plan of Care (Signed)
Pt is alert oriented x 2. Pt denies numbness and tingling. Pt is able to move all extremities. Pt has wrap to left arm. No distress noted. Pt has 2 episodes of lose stools.  ? ?Problem: Education: ?Goal: Knowledge of General Education information will improve ?Description: Including pain rating scale, medication(s)/side effects and non-pharmacologic comfort measures ?Outcome: Progressing ?  ?Problem: Health Behavior/Discharge Planning: ?Goal: Ability to manage health-related needs will improve ?Outcome: Progressing ?  ?Problem: Clinical Measurements: ?Goal: Ability to maintain clinical measurements within normal limits will improve ?Outcome: Progressing ?Goal: Will remain free from infection ?Outcome: Progressing ?Goal: Diagnostic test results will improve ?Outcome: Progressing ?Goal: Respiratory complications will improve ?Outcome: Progressing ?Goal: Cardiovascular complication will be avoided ?Outcome: Progressing ?  ?Problem: Activity: ?Goal: Risk for activity intolerance will decrease ?Outcome: Progressing ?  ?Problem: Nutrition: ?Goal: Adequate nutrition will be maintained ?Outcome: Progressing ?  ?Problem: Coping: ?Goal: Level of anxiety will decrease ?Outcome: Progressing ?  ?Problem: Elimination: ?Goal: Will not experience complications related to bowel motility ?Outcome: Progressing ?Goal: Will not experience complications related to urinary retention ?Outcome: Progressing ?  ?Problem: Pain Managment: ?Goal: General experience of comfort will improve ?Outcome: Progressing ?  ?Problem: Safety: ?Goal: Ability to remain free from injury will improve ?Outcome: Progressing ?  ?Problem: Safety: ?Goal: Ability to remain free from injury will improve ?Outcome: Progressing ?  ?Problem: Skin Integrity: ?Goal: Risk for impaired skin integrity will decrease ?Outcome: Progressing ?  ?

## 2021-06-09 DIAGNOSIS — K7682 Hepatic encephalopathy: Secondary | ICD-10-CM | POA: Diagnosis not present

## 2021-06-09 DIAGNOSIS — E7220 Disorder of urea cycle metabolism, unspecified: Secondary | ICD-10-CM | POA: Diagnosis not present

## 2021-06-09 DIAGNOSIS — K7581 Nonalcoholic steatohepatitis (NASH): Secondary | ICD-10-CM | POA: Diagnosis not present

## 2021-06-09 DIAGNOSIS — D696 Thrombocytopenia, unspecified: Secondary | ICD-10-CM | POA: Diagnosis not present

## 2021-06-09 DIAGNOSIS — K766 Portal hypertension: Secondary | ICD-10-CM | POA: Diagnosis not present

## 2021-06-09 DIAGNOSIS — K7460 Unspecified cirrhosis of liver: Secondary | ICD-10-CM | POA: Diagnosis not present

## 2021-06-09 LAB — BASIC METABOLIC PANEL
Anion gap: 7 (ref 5–15)
BUN: 12 mg/dL (ref 8–23)
CO2: 26 mmol/L (ref 22–32)
Calcium: 8.1 mg/dL — ABNORMAL LOW (ref 8.9–10.3)
Chloride: 104 mmol/L (ref 98–111)
Creatinine, Ser: 0.76 mg/dL (ref 0.44–1.00)
GFR, Estimated: >60 mL/min (ref >60)
Glucose, Bld: 164 mg/dL — ABNORMAL HIGH (ref 70–99)
Potassium: 3.8 mmol/L (ref 3.5–5.1)
Sodium: 137 mmol/L (ref 135–145)

## 2021-06-09 LAB — AMMONIA: Ammonia: 99 umol/L — ABNORMAL HIGH (ref 9–35)

## 2021-06-09 MED ORDER — ACETAMINOPHEN 325 MG PO TABS
650.0000 mg | ORAL_TABLET | Freq: Four times a day (QID) | ORAL | Status: DC | PRN
  Administered 2021-06-09 (×2): 650 mg via ORAL
  Filled 2021-06-09 (×2): qty 2

## 2021-06-09 MED ORDER — SERTRALINE HCL 50 MG PO TABS
50.0000 mg | ORAL_TABLET | Freq: Every day | ORAL | Status: DC
  Administered 2021-06-09 – 2021-06-10 (×2): 50 mg via ORAL
  Filled 2021-06-09 (×2): qty 1

## 2021-06-09 MED ORDER — MELATONIN 3 MG PO TABS
3.0000 mg | ORAL_TABLET | Freq: Every evening | ORAL | Status: DC | PRN
  Administered 2021-06-09: 3 mg via ORAL
  Filled 2021-06-09: qty 1

## 2021-06-09 NOTE — Plan of Care (Signed)
Pt is alert oriented x 3. 2200 dose lactulose held pt has had greater than 3BMS. Pt denies pain. Pt has splint to left arm. Pt has purewick. No distress noted.  ? ?Problem: Education: ?Goal: Knowledge of General Education information will improve ?Description: Including pain rating scale, medication(s)/side effects and non-pharmacologic comfort measures ?Outcome: Progressing ?  ?Problem: Health Behavior/Discharge Planning: ?Goal: Ability to manage health-related needs will improve ?Outcome: Progressing ?  ?Problem: Clinical Measurements: ?Goal: Ability to maintain clinical measurements within normal limits will improve ?Outcome: Progressing ?Goal: Will remain free from infection ?Outcome: Progressing ?Goal: Diagnostic test results will improve ?Outcome: Progressing ?Goal: Respiratory complications will improve ?Outcome: Progressing ?Goal: Cardiovascular complication will be avoided ?Outcome: Progressing ?  ?

## 2021-06-09 NOTE — Progress Notes (Signed)
Occupational Therapy Treatment ?Patient Details ?Name: Courtney Braun ?MRN: [id number] ?DOB: 25-Jul-1950 ?Today's Date: 06/09/2021 ? ? ?History of present illness The pt is a 71 yo female presenting 3/22 with AMS after being found minimally responsive at rehab facility. Pt admitted originally 2/9-2/22 due to abdominal pain, septic shock, and PNA. The pt was d/c to SNF and sustained a fall there on 3/17 resulting in L distal radius fx which was splinted and then she returned to SNF. PMH includes: HTN, portal vein thrombosis, cirrhosis, T2DM, hepatic encephalopathy, history of esophageal varices, malnutrition, and anemia. ?  ?OT comments ? Pt progressing towards acute OT goals. Pt given exercise to work on composite extension L hand. Up to recliner with mod A +1, squat-pivot as pt unable to come to standing position without +2 HHA. Transferred to her right side. D/c recommendation remains appropriate.  ? ?Recommendations for follow up therapy are one component of a multi-disciplinary discharge planning process, led by the attending physician.  Recommendations may be updated based on patient status, additional functional criteria and insurance authorization. ?   ?Follow Up Recommendations ? Skilled nursing-short term rehab (<3 hours/day)  ?  ?Assistance Recommended at Discharge Frequent or constant Supervision/Assistance  ?Patient can return home with the following ? A lot of help with walking and/or transfers;A lot of help with bathing/dressing/bathroom ?  ?Equipment Recommendations ? None recommended by OT  ?  ?Recommendations for Other Services   ? ?  ?Precautions / Restrictions Precautions ?Precautions: Fall  ? ? ?  ? ?Mobility Bed Mobility ?Overal bed mobility: Needs Assistance ?Bed Mobility: Supine to Sit ?  ?  ?Supine to sit: Min guard, HOB elevated ?  ?  ?  ?  ? ?Transfers ?Overall transfer level: Needs assistance ?  ?Transfers: Bed to chair/wheelchair/BSC ?  ?  ?Squat pivot transfers: Mod assist ?  ?  ?   ?General transfer comment: EOB to recliner on right side. ?  ?  ?Balance Overall balance assessment: Needs assistance ?Sitting-balance support: No upper extremity supported, Feet supported ?Sitting balance-Leahy Scale: Fair ?  ?  ?  ?  ?  ?  ?  ?  ?  ?  ?  ?  ?  ?  ?  ?  ?   ? ?ADL either performed or assessed with clinical judgement  ? ?ADL Overall ADL's : Needs assistance/impaired ?  ?  ?  ?  ?  ?  ?  ?  ?  ?  ?  ?  ?Toilet Transfer: Moderate assistance;Squat-pivot;BSC/3in1 ?Toilet Transfer Details (indicate cue type and reason): simulated with EOB to recliner on right side. ?  ?  ?  ?  ?  ?General ADL Comments: Provided pt with exercise to work on composite extension L hand. Pt then squat-pivoted to access recliner with +1 mod A. ?  ? ?Extremity/Trunk Assessment Upper Extremity Assessment ?Upper Extremity Assessment: Generalized weakness;LUE deficits/detail ?LUE Deficits / Details: L prior wrist fx. Limited finger extension and noted edema ?LUE Coordination: decreased fine motor;decreased gross motor ?  ?Lower Extremity Assessment ?Lower Extremity Assessment: Defer to PT evaluation ?  ?  ?  ? ?Vision   ?  ?  ?Perception   ?  ?Praxis   ?  ? ?Cognition Arousal/Alertness: Awake/alert ?Behavior During Therapy: [facility] for tasks assessed/performed ?Overall Cognitive Status: Impaired/Different from baseline ?Area of Impairment: Problem solving, Awareness, Following commands, Safety/judgement ?  ?  ?  ?  ?  ?  ?  ?  ?  ?  ?  ?  ?  ?  ?  ?  ?  ?  ?   ?  Exercises   ? ?  ?Shoulder Instructions   ? ? ?  ?General Comments    ? ? ?Pertinent Vitals/ Pain       Pain Assessment ?Pain Assessment: No/denies pain ? ?Home Living   ?  ?  ?  ?  ?  ?  ?  ?  ?  ?  ?  ?  ?  ?  ?  ?  ?  ?  ? ?  ?Prior Functioning/Environment    ?  ?  ?  ?   ? ?Frequency ? Min 2X/week  ? ? ? ? ?  ?Progress Toward Goals ? ?OT Goals(current goals can now be found in the care plan section) ? Progress towards OT goals: Progressing toward goals ? ?Acute Rehab OT  Goals ?Patient Stated Goal: [name] to finish rehab then home ?OT Goal Formulation: With patient ?Time For Goal Achievement: 06/22/21 ?Potential to Achieve Goals: Good ?ADL Goals ?Pt Will Perform Grooming: with min assist;standing ?Pt Will Perform Upper Body Dressing: with set-up;with supervision;sitting ?Pt Will Perform Lower Body Dressing: with min assist;sit to/from stand ?Pt Will Transfer to Toilet: with min guard assist;stand pivot transfer;bedside commode  ?Plan Discharge plan remains appropriate   ? ?Co-evaluation ? ? ?   ?  ?  ?  ?  ? ?  ?AM-PAC OT "6 Clicks" Daily Activity     ?Outcome Measure ? ? Help from another person eating meals?: A Little ?Help from another person taking care of personal grooming?: A Lot ?Help from another person toileting, which includes using toliet, bedpan, or urinal?: A Lot ?Help from another person bathing (including washing, rinsing, drying)?: A Lot ?Help from another person to put on and taking off regular upper body clothing?: A Little ?Help from another person to put on and taking off regular lower body clothing?: A Lot ?6 Click Score: 14 ? ?  ?End of Session Equipment Utilized During Treatment: Gait belt ? ?OT Visit Diagnosis: Unsteadiness on feet (R26.81);Other abnormalities of gait and mobility (R26.89);Muscle weakness (generalized) (M62.81) ?  ?Activity Tolerance Patient tolerated treatment well ?  ?Patient Left in chair;with call bell/phone within reach;with chair alarm set ?  ?Nurse Communication Mobility status ?  ? ?   ? ?Time: 5284-1324 ?OT Time Calculation (min): 29 min ? ?Charges: OT General Charges ?$OT Visit: 1 Visit ?OT Treatments ?$Self Care/Home Management : 23-37 mins ? ?Tyrone Schimke, OT ?Acute Rehabilitation Services ?Office: [phone] ? ? ?Tyrone Schimke H ?06/09/2021, 2:23 PM ?

## 2021-06-09 NOTE — TOC Progression Note (Signed)
Transition of Care (TOC) - Progression Note  ? ? ?Patient Details  ?Name: Courtney Braun ?MRN: [id number] ?Date of Birth: 10-Jun-1950 ? ?Transition of Care (TOC) CM/SW Contact  ?Geralynn Ochs, LCSW ?Phone Number: ?06/09/2021, 3:42 PM ? ?Clinical Narrative:   CSW following for return to [name]. Authorization still pending but should be approved by end of day, but per MD, not ready for discharge today. Hopeful for tomorrow. CSW confirmed that [name] can readmit patient tomorrow. CSW to follow. ? ? ? ?Expected Discharge Plan: [place] ?Barriers to Discharge: Continued Medical Work up, Ship broker ? ?Expected Discharge Plan and Services ?Expected Discharge Plan: [place] ?  ?  ?Post Acute Care Choice: [place] ?Living arrangements for the past 2 months: [place] ?                ?  ?  ?  ?  ?  ?  ?  ?  ?  ?  ? ? ?Social Determinants of Health (SDOH) Interventions ?  ? ?Readmission Risk Interventions ?   ? View : No data to display.  ?  ?  ?  ? ? ?

## 2021-06-09 NOTE — Progress Notes (Signed)
? ? ? [facility] ?                                                                            ? ? ?Courtney Braun, is a 71 y.o. female, DOB - 01-24-1951, DJT:[id number] ?Admit date - 06/07/2021    ?Outpatient Primary MD for the patient is Mayra Neer, MD ? ?LOS - 0  days ? ? ? ?Brief summary  ? ?AMAMDA CURBOW is a 71 y.o. female with medical history significant for [name] cirrhosis complicated by esophageal varices with previous hemorrhage, chronic portal vein thrombosis, gastric renal shunt, ascites, hepatic hydrothorax, hepatic encephalopathy, chronic thrombocytopenia who is admitted with hepatic encephalopathy. ? ? ?Assessment & Plan  ? ? ?Assessment and Plan: ?* Acute hepatic encephalopathy ?Decompensated NASH hepatic cirrhosis ?History of esophageal varices with prior bleeding ?Portal hypertension ?Patient admitted with acute hepatic encephalopathy.  NG tube placed in ED to deliver medication safely.   ?She removed the NG tube.  ?Ammonia level improved from 127 to 90. Repeat ammonia level still up at 99.  ?Increased lactulose to 30 mg BID. Continue with rifaximin.  ?Target 2 to 3 bowel movements per day.  ?No source of infection found.  ?She is more alert and able to answer simple questions.  ?She will need another 1 to 2 days for the ammonia levels to clear .  ?Continue to monitor.  ?Therapy eval recommending SNF.  ? ? ? ? ?Thrombocytopenia ([place]) ?Chronic thrombocytopenia secondary to hepatic cirrhosis.  No obvious bleeding on admission.   ?Hold pharmacologic VTE prophylaxis. ? ?Thrombosis, portal vein ?Chronic portal vein thrombosis. No anti coagulation due to recurrent falls.  ? ? ?Recurrent falls leading to Acute, impacted, comminuted, and angulated fracture of the distal ?radial metaphysis. ?Therapy evaluations ordered.recommending SNF.  ? ? ? ?Anemia of chronic disease:  ?Hemoglobin around 10, no signs of bleeding.  ?[place].  ? ? ? ?Estimated body mass index is 31.6 kg/m? as calculated  from the following: ?  Height as of 06/02/21: 6' (1.829 m). ?  Weight as of this encounter: 105.7 kg. ? ?Code Status: DNR ?DVT Prophylaxis:  SCDs Start: 06/07/21 1352 ? ? ?Level of Care: Level of care: Med-Surg ?Family Communication: Updated patient's niece over the phone.  ? ?Disposition Plan:     Remains inpatient appropriate:  acute hepatic encephalopathy. Elevated ammonia levels.  ? ?Procedures:  ?None.  ? ?Consultants:   ?None.  ? ?Antimicrobials:  ? ?Anti-infectives (From admission, onward)  ? ? Start     Dose/Rate Route Frequency Ordered Stop  ? 06/08/21 1015  rifaximin (XIFAXAN) tablet 550 mg       ? 550 mg Oral 2 times daily 06/08/21 0920    ? 06/07/21 2200  rifaximin (XIFAXAN) tablet 550 mg  Status:  Discontinued       ? 550 mg Per Tube 2 times daily 06/07/21 1355 06/08/21 0920  ? 06/07/21 1245  rifaximin (XIFAXAN) tablet 550 mg       ? 550 mg Oral  Once 06/07/21 1234 06/07/21 1314  ? ?  ? ? ? ?Medications ? ?Scheduled Meds: ? furosemide  40 mg Oral Daily  ? lactulose  30 g Oral TID  ?  nadolol  20 mg Oral Daily  ? rifaximin  550 mg Oral BID  ? spironolactone  100 mg Oral Daily  ? ?Continuous Infusions: ?PRN Meds:.acetaminophen, ondansetron **OR** ondansetron (ZOFRAN) IV ? ? ? ?Subjective:  ? ?Courtney Braun was seen and examined today.  She is alert and answering simple questions.  ? ?Objective:  ? ?Vitals:  ? 06/09/21 0339 06/09/21 0751 06/09/21 1224 06/09/21 1618  ?BP: 135/64 132/61 130/62 (!) 112/47  ?Pulse: 69 76 65 65  ?Resp:  17 17 20   ?Temp: 97.8 ?F (36.6 ?C) 98.1 ?F (36.7 ?C) 98.4 ?F (36.9 ?C) 98 ?F (36.7 ?C)  ?TempSrc: Oral Oral Oral Oral  ?SpO2: 96% 96% 96% 96%  ?Weight:      ? ? ?Intake/Output Summary (Last 24 hours) at 06/09/2021 1657 ?Last data filed at 06/09/2021 1500 ?Gross per 24 hour  ?Intake --  ?Output 1101 ml  ?Net -1101 ml  ? ? ?Filed Weights  ? 06/07/21 0900 06/08/21 0500  ?Weight: 107.6 kg 105.7 kg  ? ? ? ?Exam. ?General exam: Appears calm and comfortable  ?Respiratory system: Clear to  auscultation. Respiratory effort normal. ?Cardiovascular system: S1 & S2 heard, RRR. No JVD, No pedal edema. ?Gastrointestinal system: Abdomen is nondistended, soft and nontender. Normal bowel sounds heard. ?Central nervous system: Alert and oriented to place and person. No focal deficits.  ?Extremities: no edema.  ?Skin: No rashes,  ?Psychiatry: Mood & affect appropriate.  ? ? ? ?Data Reviewed:  I have personally reviewed following labs and imaging studies ? ? ?CBC ?Lab Results  ?Component Value Date  ? WBC 7.1 06/08/2021  ? RBC 3.26 (L) 06/08/2021  ? HGB 10.4 (L) 06/08/2021  ? HCT 29.8 (L) 06/08/2021  ? MCV 91.4 06/08/2021  ? MCH 31.9 06/08/2021  ? PLT 68 (L) 06/08/2021  ? MCHC 34.9 06/08/2021  ? RDW 16.9 (H) 06/08/2021  ? LYMPHSABS 1.3 06/07/2021  ? MONOABS 0.6 06/07/2021  ? EOSABS 0.2 06/07/2021  ? BASOSABS 0.0 06/07/2021  ? ? ? ?Last metabolic panel ?Lab Results  ?Component Value Date  ? NA 137 06/09/2021  ? K 3.8 06/09/2021  ? CL 104 06/09/2021  ? CO2 26 06/09/2021  ? BUN 12 06/09/2021  ? CREATININE 0.76 06/09/2021  ? GLUCOSE 164 (H) 06/09/2021  ? GFRNONAA >60 06/09/2021  ? CALCIUM 8.1 (L) 06/09/2021  ? PHOS 4.2 05/01/2021  ? PROT 5.5 (L) 06/08/2021  ? ALBUMIN 2.1 (L) 06/08/2021  ? BILITOT 3.3 (H) 06/08/2021  ? ALKPHOS 91 06/08/2021  ? AST 52 (H) 06/08/2021  ? ALT 31 06/08/2021  ? ANIONGAP 7 06/09/2021  ? ? ?CBG (last 3)  ?Recent Labs  ?  06/07/21 ?0934  ?GLUCAP 123*  ? ?  ? ? ?Coagulation Profile: ?Recent Labs  ?Lab 06/07/21 ?8250 06/08/21 ?0134  ?INR 1.8* 2.1*  ? ? ? ? ?Radiology Studies: ?No results found. ? ? ? ? ?Hosie Poisson M.D. ?[facility] ?06/09/2021, 4:57 PM ? ?Available via Epic secure chat 7am-7pm ?After 7 pm, please refer to night coverage provider listed on amion. ? ? ? ?

## 2021-06-09 NOTE — Progress Notes (Signed)
Speech Language Pathology Treatment: Dysphagia  ?Patient Details ?Name: Courtney Braun ?MRN: [id number] ?DOB: [birth year] ?Today's Date: 06/09/2021 ?Time: 3382-5053 ?SLP Time Calculation (min) (ACUTE ONLY): 12 min ? ?Assessment / Plan / Recommendation ?Clinical Impression ? Pt was seen during breakfast for dysphagia treatment and she was cooperative throughout the session. Pt, and nursing reported that the pt has been tolerating the upgraded diet without overt s/sx of aspiration. Pt consumed a meal of bacon, breakfast potatoes, an English muffin, grits, scrambled eggs, and various thin liquids. Pt tolerated all solids and thin liquids via straw using individual and consecutive swallows without symptoms of oropharyngeal dysphagia. It is recommended that the current, regular texture diet be continued. Further skilled SLP services are not clinically indicated at this time.  ?  ?HPI HPI: Pt is a 71 y.o. female who presented to the ED with AMS. NGT placed 3/22 due to somnolence. CT head negative. [place] failed and then subsequently passed 3/22. CXR 3/22: Patchy bilateral airspace disease right greater than left. This is likely edema. Overall improvement since 04/29/2021. Swallow evaluation requested 3/23. PMH: [name] cirrhosis complicated by esophageal varices with previous hemorrhage, chronic portal vein thrombosis, gastric renal shunt, ascites, hepatic hydrothorax, hepatic encephalopathy, chronic thrombocytopenia. ?  ?   ?SLP Plan ? All goals met;Discharge SLP treatment due to (comment) ? ?  ?  ?Recommendations for follow up therapy are one component of a multi-disciplinary discharge planning process, led by the attending physician.  Recommendations may be updated based on patient status, additional functional criteria and insurance authorization. ?  ? ?Recommendations  ?Diet recommendations: Regular;Thin liquid ?Liquids provided via: Cup;Straw ?Medication Administration: Whole meds with puree (per pt's  request) ?Supervision: Patient able to self feed (assitance with set up) ?Compensations: Slow rate ?Postural Changes and/or Swallow Maneuvers: Seated upright 90 degrees  ?   ?    ?   ? ? ? ? Oral Care Recommendations: Oral care BID ?Follow Up Recommendations: No SLP follow up ?Assistance recommended at discharge: None ?SLP Visit Diagnosis: Dysphagia, unspecified (R13.10) ?Plan: All goals met;Discharge SLP treatment due to (comment) ? ? ? ? ?  ?  ?Teshara Moree I. Hardin Negus, [place], CCC-SLP ?Acute Rehabilitation Services ?Office number [phone] ?Pager [phone] ? ? ?Courtney Braun ? ?06/09/2021, 9:37 AM ? ? ? ? ?

## 2021-06-10 DIAGNOSIS — E46 Unspecified protein-calorie malnutrition: Secondary | ICD-10-CM | POA: Diagnosis not present

## 2021-06-10 DIAGNOSIS — S52562A Barton's fracture of left radius, initial encounter for closed fracture: Secondary | ICD-10-CM | POA: Diagnosis not present

## 2021-06-10 DIAGNOSIS — L89610 Pressure ulcer of right heel, unstageable: Secondary | ICD-10-CM | POA: Diagnosis not present

## 2021-06-10 DIAGNOSIS — S6292XD Unspecified fracture of left wrist and hand, subsequent encounter for fracture with routine healing: Secondary | ICD-10-CM | POA: Diagnosis not present

## 2021-06-10 DIAGNOSIS — Z743 Need for continuous supervision: Secondary | ICD-10-CM | POA: Diagnosis not present

## 2021-06-10 DIAGNOSIS — D696 Thrombocytopenia, unspecified: Secondary | ICD-10-CM | POA: Diagnosis not present

## 2021-06-10 DIAGNOSIS — G479 Sleep disorder, unspecified: Secondary | ICD-10-CM | POA: Diagnosis not present

## 2021-06-10 DIAGNOSIS — K769 Liver disease, unspecified: Secondary | ICD-10-CM | POA: Diagnosis not present

## 2021-06-10 DIAGNOSIS — K7581 Nonalcoholic steatohepatitis (NASH): Secondary | ICD-10-CM | POA: Diagnosis not present

## 2021-06-10 DIAGNOSIS — I81 Portal vein thrombosis: Secondary | ICD-10-CM | POA: Diagnosis not present

## 2021-06-10 DIAGNOSIS — I10 Essential (primary) hypertension: Secondary | ICD-10-CM | POA: Diagnosis not present

## 2021-06-10 DIAGNOSIS — D638 Anemia in other chronic diseases classified elsewhere: Secondary | ICD-10-CM | POA: Diagnosis not present

## 2021-06-10 DIAGNOSIS — K7469 Other cirrhosis of liver: Secondary | ICD-10-CM | POA: Diagnosis not present

## 2021-06-10 DIAGNOSIS — E7220 Disorder of urea cycle metabolism, unspecified: Secondary | ICD-10-CM | POA: Diagnosis not present

## 2021-06-10 DIAGNOSIS — Z66 Do not resuscitate: Secondary | ICD-10-CM | POA: Diagnosis not present

## 2021-06-10 DIAGNOSIS — E785 Hyperlipidemia, unspecified: Secondary | ICD-10-CM | POA: Diagnosis not present

## 2021-06-10 DIAGNOSIS — G459 Transient cerebral ischemic attack, unspecified: Secondary | ICD-10-CM | POA: Diagnosis not present

## 2021-06-10 DIAGNOSIS — J189 Pneumonia, unspecified organism: Secondary | ICD-10-CM | POA: Diagnosis not present

## 2021-06-10 DIAGNOSIS — R2689 Other abnormalities of gait and mobility: Secondary | ICD-10-CM | POA: Diagnosis not present

## 2021-06-10 DIAGNOSIS — K7682 Hepatic encephalopathy: Secondary | ICD-10-CM | POA: Diagnosis not present

## 2021-06-10 DIAGNOSIS — Z8719 Personal history of other diseases of the digestive system: Secondary | ICD-10-CM | POA: Diagnosis not present

## 2021-06-10 DIAGNOSIS — J9601 Acute respiratory failure with hypoxia: Secondary | ICD-10-CM | POA: Diagnosis not present

## 2021-06-10 DIAGNOSIS — M6259 Muscle wasting and atrophy, not elsewhere classified, multiple sites: Secondary | ICD-10-CM | POA: Diagnosis not present

## 2021-06-10 DIAGNOSIS — E119 Type 2 diabetes mellitus without complications: Secondary | ICD-10-CM | POA: Diagnosis not present

## 2021-06-10 DIAGNOSIS — Z8673 Personal history of transient ischemic attack (TIA), and cerebral infarction without residual deficits: Secondary | ICD-10-CM | POA: Diagnosis not present

## 2021-06-10 DIAGNOSIS — R279 Unspecified lack of coordination: Secondary | ICD-10-CM | POA: Diagnosis not present

## 2021-06-10 DIAGNOSIS — R251 Tremor, unspecified: Secondary | ICD-10-CM | POA: Diagnosis not present

## 2021-06-10 DIAGNOSIS — E440 Moderate protein-calorie malnutrition: Secondary | ICD-10-CM | POA: Diagnosis not present

## 2021-06-10 DIAGNOSIS — K7460 Unspecified cirrhosis of liver: Secondary | ICD-10-CM | POA: Diagnosis not present

## 2021-06-10 DIAGNOSIS — F32A Depression, unspecified: Secondary | ICD-10-CM | POA: Diagnosis not present

## 2021-06-10 DIAGNOSIS — Z9181 History of falling: Secondary | ICD-10-CM | POA: Diagnosis not present

## 2021-06-10 DIAGNOSIS — R262 Difficulty in walking, not elsewhere classified: Secondary | ICD-10-CM | POA: Diagnosis not present

## 2021-06-10 DIAGNOSIS — D649 Anemia, unspecified: Secondary | ICD-10-CM | POA: Diagnosis not present

## 2021-06-10 DIAGNOSIS — K766 Portal hypertension: Secondary | ICD-10-CM | POA: Diagnosis not present

## 2021-06-10 LAB — CBC
HCT: 30 % — ABNORMAL LOW (ref 36.0–46.0)
Hemoglobin: 10.1 g/dL — ABNORMAL LOW (ref 12.0–15.0)
MCH: 31.5 pg (ref 26.0–34.0)
MCHC: 33.7 g/dL (ref 30.0–36.0)
MCV: 93.5 fL (ref 80.0–100.0)
Platelets: 62 10*3/uL — ABNORMAL LOW (ref 150–400)
RBC: 3.21 MIL/uL — ABNORMAL LOW (ref 3.87–5.11)
RDW: 17.2 % — ABNORMAL HIGH (ref 11.5–15.5)
WBC: 5.1 10*3/uL (ref 4.0–10.5)
nRBC: 0 % (ref 0.0–0.2)

## 2021-06-10 LAB — BASIC METABOLIC PANEL
Anion gap: 3 — ABNORMAL LOW (ref 5–15)
BUN: 15 mg/dL (ref 8–23)
CO2: 29 mmol/L (ref 22–32)
Calcium: 8 mg/dL — ABNORMAL LOW (ref 8.9–10.3)
Chloride: 105 mmol/L (ref 98–111)
Creatinine, Ser: 0.77 mg/dL (ref 0.44–1.00)
GFR, Estimated: >60 mL/min (ref >60)
Glucose, Bld: 119 mg/dL — ABNORMAL HIGH (ref 70–99)
Potassium: 3.4 mmol/L — ABNORMAL LOW (ref 3.5–5.1)
Sodium: 137 mmol/L (ref 135–145)

## 2021-06-10 LAB — AMMONIA: Ammonia: 127 umol/L — ABNORMAL HIGH (ref 9–35)

## 2021-06-10 MED ORDER — POTASSIUM CHLORIDE CRYS ER 20 MEQ PO TBCR
40.0000 meq | EXTENDED_RELEASE_TABLET | Freq: Once | ORAL | Status: AC
  Administered 2021-06-10: 40 meq via ORAL
  Filled 2021-06-10: qty 2

## 2021-06-10 NOTE — Progress Notes (Signed)
Occupational Therapy Treatment ?Patient Details ?Name: Courtney Braun ?MRN: [id number] ?DOB: Jan 14, 1951 ?Today's Date: 06/10/2021 ? ? ?History of present illness The pt is a 71 yo female presenting 3/22 with AMS after being found minimally responsive at rehab facility. Pt admitted originally 2/9-2/22 due to abdominal pain, septic shock, and PNA. The pt was d/c to SNF and sustained a fall there on 3/17 resulting in L distal radius fx which was splinted and then she returned to SNF. PMH includes: HTN, portal vein thrombosis, cirrhosis, T2DM, hepatic encephalopathy, history of esophageal varices, malnutrition, and anemia. ?  ?OT comments ? Ross[place] is making functional progress towards her acute goals. She was able to elevate into standing with mod A and take pivotal steps with min G. Pt tolerated LUE exercises well, with report of some increase in pain at the end of the session. Throughout, pt requires increased time for processing and problem solving and benefits from a non-distracting environment.   ? ?Recommendations for follow up therapy are one component of a multi-disciplinary discharge planning process, led by the attending physician.  Recommendations may be updated based on patient status, additional functional criteria and insurance authorization. ?   ?Follow Up Recommendations ? Skilled nursing-short term rehab (<3 hours/day)  ?  ?Assistance Recommended at Discharge Frequent or constant Supervision/Assistance  ?Patient can return home with the following ? A lot of help with walking and/or transfers;A lot of help with bathing/dressing/bathroom ?  ?Equipment Recommendations ? None recommended by OT  ?  ?   ?Precautions / Restrictions Precautions ?Precautions: Fall  ? ? ?  ? ?Mobility Bed Mobility ?Overal bed mobility: Needs Assistance ?Bed Mobility: Sit to Supine ?  ?  ?  ?Sit to supine: Min assist ?  ?  ?  ? ?Transfers ?Overall transfer level: Needs assistance ?Equipment used: 1 person hand held  assist ?Transfers: Sit to/from Stand, Bed to chair/wheelchair/BSC ?Sit to Stand: Mod assist ?  ?  ?Step pivot transfers: Min guard ?  ?  ?General transfer comment: going toward R ?  ?  ?Balance Overall balance assessment: Needs assistance ?Sitting-balance support: No upper extremity supported, Feet supported ?Sitting balance-Leahy Scale: Fair ?  ?  ?Standing balance support: Single extremity supported ?Standing balance-Leahy Scale: Poor ?  ?  ?  ?  ?  ?  ?  ?  ?  ?  ?  ?  ?   ? ?ADL either performed or assessed with clinical judgement  ? ?ADL Overall ADL's : Needs assistance/impaired ?  ?  ?  ?  ?  ?  ?  ?  ?  ?  ?  ?  ?Toilet Transfer: Moderate assistance;Stand-pivot;BSC/3in1 ?Toilet Transfer Details (indicate cue type and reason): step pivot, mod A to power into standing. once standing, pt is able to step with close min G ?  ?  ?  ?  ?Functional mobility during ADLs: Moderate assistance ?General ADL Comments: LUE exercises completed ?  ? ?Extremity/Trunk Assessment Upper Extremity Assessment ?Upper Extremity Assessment: LUE deficits/detail ?LUE Deficits / Details: L prior wrist fx. Limited finger extension and noted edema ?LUE Coordination: decreased fine motor;decreased gross motor ?  ?Lower Extremity Assessment ?Lower Extremity Assessment: Defer to PT evaluation ?  ?  ?  ? ?Vision   ?Vision Assessment?: No apparent visual deficits ?  ?Perception Perception ?Perception: Not tested ?  ?Praxis Praxis ?Praxis: Not tested ?  ? ?Cognition Arousal/Alertness: Awake/alert ?Behavior During Therapy: [facility] for tasks assessed/performed ?Overall Cognitive Status: Impaired/Different from baseline ?Area  of Impairment: Problem solving, Awareness, Following commands, Safety/judgement ?  ?  ?  ?  ?  ?  ?  ?  ?  ?  ?  ?Following Commands: Follows one step commands with increased time ?Safety/Judgement: Decreased awareness of deficits, Decreased awareness of safety ?Awareness: Emergent ?Problem Solving: Slow processing, Requires  verbal cues ?General Comments: follows one step commands well, multi-step with incr time and minimal cues. slow processing ?  ?  ?   ?Exercises Exercises: Other exercises ?Other Exercises ?Other Exercises: L hand digit AAROM ROM ? ?  ?Shoulder Instructions   ? ? ?  ?General Comments VSS on RA  ? ? ?Pertinent Vitals/ Pain       Pain Assessment ?Pain Assessment: Faces ?Faces Pain Scale: Hurts little more ?Pain Location: LUE ?Pain Descriptors / Indicators: Discomfort, Grimacing ?Pain Intervention(s): Limited activity within patient's tolerance, Monitored during session ? ? ?Frequency ? Min 2X/week  ? ? ? ? ?  ?Progress Toward Goals ? ?OT Goals(current goals can now be found in the care plan section) ? Progress towards OT goals: Progressing toward goals ? ?Acute Rehab OT Goals ?Patient Stated Goal: leave today ?OT Goal Formulation: With patient ?Time For Goal Achievement: 06/22/21 ?Potential to Achieve Goals: Good ?ADL Goals ?Pt Will Perform Grooming: with min assist;standing ?Pt Will Perform Upper Body Dressing: with set-up;with supervision;sitting ?Pt Will Perform Lower Body Dressing: with min assist;sit to/from stand ?Pt Will Transfer to Toilet: with min guard assist;stand pivot transfer;bedside commode  ?Plan Discharge plan remains appropriate   ? ?Co-evaluation ? ? ?   ?  ?  ?  ?  ? ?  ?AM-PAC OT "6 Clicks" Daily Activity     ?Outcome Measure ? ? Help from another person eating meals?: A Little ?Help from another person taking care of personal grooming?: A Little ?Help from another person toileting, which includes using toliet, bedpan, or urinal?: A Lot ?Help from another person bathing (including washing, rinsing, drying)?: A Lot ?Help from another person to put on and taking off regular upper body clothing?: A Little ?Help from another person to put on and taking off regular lower body clothing?: A Lot ?6 Click Score: 15 ? ?  ?End of Session Equipment Utilized During Treatment: Gait belt ? ?OT Visit Diagnosis:  Unsteadiness on feet (R26.81);Other abnormalities of gait and mobility (R26.89);Muscle weakness (generalized) (M62.81) ?  ?Activity Tolerance Patient tolerated treatment well ?  ?Patient Left in bed;with call bell/phone within reach;with bed alarm set ?  ?Nurse Communication Mobility status ?  ? ?   ? ?Time: 9753-0051 ?OT Time Calculation (min): 23 min ? ?Charges: OT General Charges ?$OT Visit: 1 Visit ?OT Treatments ?$Therapeutic Activity: 8-22 mins ?$Therapeutic Exercise: 8-22 mins ? ? ? ?Maryem Shuffler A Sabatino Williard ?06/10/2021, 3:55 PM ?

## 2021-06-10 NOTE — TOC Transition Note (Signed)
Transition of Care (TOC) - CM/SW Discharge Note ? ? ?Patient Details  ?Name: Courtney Braun ?MRN: [id number] ?Date of Birth: 1950/05/20 ? ?Transition of Care (TOC) CM/SW Contact:  ?[place], LCSWA ?Phone Number: ?06/10/2021, 2:31 PM ? ? ?Clinical Narrative:    ? ?SW spoke with Abigail Butts [facility] [phone]) reports able to accept back today. Requested transport after 330pm, pt's family will complete admission paperwork at [facility] 3/26.  ? ?PTAR called.  ? ?Call Report: [phone] ? ?Final next level of care: [place] ?Barriers to Discharge: Barriers Resolved ? ? ?Patient Goals and CMS Choice ?Patient states their goals for this hospitalization and ongoing recovery are:: get back to [name] for rehab ?CMS Medicare.gov Compare Post Acute Care list provided to:: Patient ?Choice offered to / list presented to : Patient ? ?Discharge Placement ?  ?           ?  ?Patient to be transferred to facility by: PTAR ?  ?Patient and family notified of of transfer: 06/10/21 ? ?Discharge Plan and Services ?  ?  ?Post Acute Care Choice: [place]          ?  ?  ?  ?  ?  ?  ?  ?  ?  ?  ? ?Social Determinants of Health (SDOH) Interventions ?  ? ? ?Readmission Risk Interventions ?   ? View : No data to display.  ?  ?  ?  ? ? ? ? ? ?

## 2021-06-10 NOTE — Plan of Care (Signed)
Pt is alert oriented x 4.  Pt c/o pain to left arm, swelling noted, hands warm and able move fingers, prn pain medication given with effective results. Pt has purewick in place. No distress noted.  ? ? ?Problem: Education: ?Goal: Knowledge of General Education information will improve ?Description: Including pain rating scale, medication(s)/side effects and non-pharmacologic comfort measures ?Outcome: Progressing ?  ?Problem: Health Behavior/Discharge Planning: ?Goal: Ability to manage health-related needs will improve ?Outcome: Progressing ?  ?Problem: Clinical Measurements: ?Goal: Ability to maintain clinical measurements within normal limits will improve ?Outcome: Progressing ?Goal: Will remain free from infection ?Outcome: Progressing ?Goal: Diagnostic test results will improve ?Outcome: Progressing ?Goal: Respiratory complications will improve ?Outcome: Progressing ?Goal: Cardiovascular complication will be avoided ?Outcome: Progressing ?  ?Problem: Activity: ?Goal: Risk for activity intolerance will decrease ?Outcome: Progressing ?  ?Problem: Nutrition: ?Goal: Adequate nutrition will be maintained ?Outcome: Progressing ?  ?Problem: Coping: ?Goal: Level of anxiety will decrease ?Outcome: Progressing ?  ?Problem: Elimination: ?Goal: Will not experience complications related to bowel motility ?Outcome: Progressing ?  ?Problem: Elimination: ?Goal: Will not experience complications related to bowel motility ?Outcome: Progressing ?Goal: Will not experience complications related to urinary retention ?Outcome: Progressing ?  ?Problem: Pain Managment: ?Goal: General experience of comfort will improve ?Outcome: Progressing ?  ?Problem: Safety: ?Goal: Ability to remain free from injury will improve ?Outcome: Progressing ?  ?Problem: Skin Integrity: ?Goal: Risk for impaired skin integrity will decrease ?Outcome: Progressing ?  ?

## 2021-06-10 NOTE — Discharge Summary (Signed)
?Physician Discharge Summary ?  ?Patient: Courtney Braun MRN: [id number] DOB: 04-Sep-1950  ?Admit date:     06/07/2021  ?Discharge date: 06/10/21  ?Discharge Physician: Hosie Poisson  ? ?PCP: Mayra Neer, MD  ? ?Recommendations at discharge:  ?Please follow up with Orthopedics as recommended in one week.  ? ?Discharge Diagnoses: ?Principal Problem: ?  Acute hepatic encephalopathy ?Active Problems: ?  Liver cirrhosis secondary to NASH [facility]) ?  Portal hypertension (HCC) ?  Hx of esophageal varices ?  Thrombocytopenia ([place]) ?  Thrombosis, portal vein ?  DNR (do not resuscitate) ? ? ? ?Hospital Course: ?Courtney Braun is a 71 y.o. female with medical history significant for [name] cirrhosis complicated by esophageal varices with previous hemorrhage, chronic portal vein thrombosis, gastric renal shunt, ascites, hepatic hydrothorax, hepatic encephalopathy, chronic thrombocytopenia who is admitted with hepatic encephalopathy. ? ?Assessment and Plan: ? ? ?* Acute hepatic encephalopathy ?Decompensated NASH hepatic cirrhosis ?History of esophageal varices with prior bleeding ?Portal hypertension ?Patient admitted with acute hepatic encephalopathy.  NG tube placed in ED to deliver medication safely.   ?She removed the NG tube.  ?Ammonia level improved from 127 to 90. back to 127 .  ?Target 2 to 3 bowel movements per day.  ?No source of infection found.  ?She is more alert  oriented to place , person and time. She is having frequent bowel movements. Marland Kitchen  ?Therapy eval recommending SNF.  ?  ?  ?  ?  ?Thrombocytopenia ([place]) ?Chronic thrombocytopenia secondary to hepatic cirrhosis.  No obvious bleeding on admission.   ?Hold pharmacologic VTE prophylaxis. ?  ?Thrombosis, portal vein ?Chronic portal vein thrombosis. No anti coagulation due to recurrent falls.  ?  ?  ?Recurrent falls leading to Acute, impacted, comminuted, and angulated fracture of the distal ?radial metaphysis. ?Therapy evaluations ordered.recommending SNF.   ?Recommend outpatient follow up with orthopedics in one week ?  ? Hypokalemia: replaced. ?  ?Anemia of chronic disease:  ?Hemoglobin around 10, no signs of bleeding.  ?[place].  ?  ?  ?  ? ? ?Consultants: none.  ?Procedures performed: none.   ?Disposition: Skilled nursing facility ?Diet recommendation:  ?Discharge Diet Orders (From admission, onward)  ? ?  Start     Ordered  ? 06/10/21 0000  Diet - low sodium heart healthy       ? 06/10/21 1232  ? ?  ?  ? ?  ? ?Regular diet ?DISCHARGE MEDICATION: ?Allergies as of 06/10/2021   ? ?   Reactions  ? Metformin And Related   ? Upset stomach  ? Voltaren [diclofenac]   ? diarrhea  ? ?  ? ?  ?Medication List  ?  ? ?TAKE these medications   ? ?acetaminophen 500 MG tablet ?Commonly known as: TYLENOL ?Take 1,000 mg by mouth every 6 (six) hours as needed for moderate pain or headache. ?  ?cetirizine 10 MG tablet ?Commonly known as: ZYRTEC ?Take 10 mg by mouth daily as needed for allergies. ?  ?ferrous sulfate 325 (65 FE) MG tablet ?Take 325 mg by mouth daily with breakfast. ?  ?furosemide 40 MG tablet ?Commonly known as: LASIX ?Take 1 tablet (40 mg total) by mouth daily. ?  ?Lactulose 20 GM/30ML Soln ?Take 20 mLs (13.3333 g total) by mouth daily. Titrate for 2-3 soft stools/day, increase dose to TID if needed ?What changed: additional instructions ?  ?melatonin 3 MG Tabs tablet ?Take 1 tablet (3 mg total) by mouth at bedtime as needed. ?What changed: reasons to  take this ?  ?multivitamin with minerals Tabs tablet ?Take 1 tablet by mouth daily. ?  ?nadolol 20 MG tablet ?Commonly known as: CORGARD ?Take 1 tablet (20 mg total) by mouth daily. ?  ?nystatin powder ?Commonly known as: MYCOSTATIN/NYSTOP ?Apply 1 application. topically 2 (two) times daily. Apply to groin and vaginal area ?  ?ondansetron 8 MG disintegrating tablet ?Commonly known as: Zofran ODT ?Take 1 tablet (8 mg total) by mouth every 8 (eight) hours as needed for nausea or vomiting. ?  ?oxyCODONE 5 MG immediate release  tablet ?Commonly known as: Roxicodone ?Take 1 tablet (5 mg total) by mouth every 8 (eight) hours as needed for up to 12 doses for severe pain or breakthrough pain. ?  ?pantoprazole 40 MG tablet ?Commonly known as: PROTONIX ?Take 1 tablet (40 mg total) by mouth daily. ?  ?rifaximin 550 MG Tabs tablet ?Commonly known as: XIFAXAN ?Take 1 tablet (550 mg total) by mouth 2 (two) times daily. ?  ?sertraline 50 MG tablet ?Commonly known as: ZOLOFT ?Take 50 mg by mouth daily. ?  ?simvastatin 40 MG tablet ?Commonly known as: ZOCOR ?Take 40 mg by mouth at bedtime. ?  ?spironolactone 100 MG tablet ?Commonly known as: ALDACTONE ?Take 1 tablet (100 mg total) by mouth daily. ?  ? ?  ? ? Contact information for after-discharge care   ? ? Destination   ? ? [facility] Preferred SNF .   ?Service: Skilled Nursing ?Contact information: ?[address] ?[phone] ? ?  ?  ? ?  ?  ? ?  ?  ? ?  ? ?Discharge Exam: ?Filed Weights  ? 06/07/21 0900 06/08/21 0500 06/10/21 0428  ?Weight: 107.6 kg 105.7 kg 108.1 kg  ? ?General exam: Appears calm and comfortable  ?Respiratory system: Clear to auscultation. Respiratory effort normal. ?Cardiovascular system: S1 & S2 heard, RRR. No JVD, murmurs, rubs, gallops or clicks. No pedal edema. ?Gastrointestinal system: Abdomen is nondistended, soft and nontender. No organomegaly or masses felt. Normal bowel sounds heard. ?Central nervous system: Alert and oriented. No focal neurological deficits. ?Extremities: left arm bandaged.  ?Skin: No rashes, lesions or ulcers ?Psychiatry:  Mood & affect appropriate.  ? ? ?Condition at discharge: fair ? ?The results of significant diagnostics from this hospitalization (including imaging, microbiology, ancillary and laboratory) are listed below for reference.  ? ?Imaging Studies: ?DG Wrist Complete Left ? ?Result Date: 06/02/2021 ?CLINICAL DATA:  Fall on outstretched hand. Evaluate for wrist fracture. EXAM: LEFT  WRIST - COMPLETE 3+ VIEW COMPARISON:  None. FINDINGS: There is an oblique comminuted moderately impacted fracture of the distal radial metaphysis with minimal medial apex angulation on frontal view and mild volar apex angulation on lateral view. Up to 2 mm cortical step-off of a dorsal fracture fragment on lateral view. Mildly displaced ulnar styloid acute fracture. Moderate triscaphe joint and mild thumb carpometacarpal joint space narrowing and peripheral osteophytosis. IMPRESSION: Acute, impacted, comminuted, and angulated fracture of the distal radial metaphysis. Electronically Signed   By: Yvonne Kendall M.D.   On: 06/02/2021 14:29  ? ?US Abdomen Complete ? ?Result Date: 06/07/2021 ?CLINICAL DATA:  Cirrhosis.  Ascites. EXAM: ABDOMEN ULTRASOUND COMPLETE COMPARISON:  None. FINDINGS: Gallbladder: 5 mm echogenic focus in the gallbladder is suspicious for a tiny gallstone. Evaluation is somewhat limited by incomplete gallbladder distention. No evidence of gallbladder wall thickening or pericholecystic fluid. No sonographic Murphy sign noted by sonographer. Common bile duct: Diameter: 2 mm, within normal limits. Liver: Diffusely coarsened  echotexture and capsular nodularity, consistent with cirrhosis. No hepatic mass identified. Mild perihepatic ascites noted. Portal vein is patent on color Doppler imaging with normal direction of blood flow towards the liver. IVC: No abnormality visualized. Pancreas: Visualized portion unremarkable. Spleen: Mild splenomegaly, with length of approximately 13.4 cm. No splenic masses identified. Right Kidney: Length: 10.6 cm. Echogenicity within normal limits. No mass or hydronephrosis visualized. Left Kidney: Length: 10.4 cm. Echogenicity within normal limits. No mass or hydronephrosis visualized. Abdominal aorta: No aneurysm visualized. Other findings: Small right pleural effusion also noted. IMPRESSION: Hepatic cirrhosis, with mild perihepatic ascites and small right pleural effusion.  No hepatic mass identified. Probable tiny gallstone. No sonographic evidence of cholecystitis or biliary ductal dilatation. Mild splenomegaly, consistent with portal venous hypertension. Electronically Si

## 2021-06-12 DIAGNOSIS — E46 Unspecified protein-calorie malnutrition: Secondary | ICD-10-CM | POA: Diagnosis not present

## 2021-06-12 DIAGNOSIS — E785 Hyperlipidemia, unspecified: Secondary | ICD-10-CM | POA: Diagnosis not present

## 2021-06-12 DIAGNOSIS — J189 Pneumonia, unspecified organism: Secondary | ICD-10-CM | POA: Diagnosis not present

## 2021-06-12 DIAGNOSIS — I10 Essential (primary) hypertension: Secondary | ICD-10-CM | POA: Diagnosis not present

## 2021-06-12 DIAGNOSIS — G479 Sleep disorder, unspecified: Secondary | ICD-10-CM | POA: Diagnosis not present

## 2021-06-12 DIAGNOSIS — F32A Depression, unspecified: Secondary | ICD-10-CM | POA: Diagnosis not present

## 2021-06-12 DIAGNOSIS — E119 Type 2 diabetes mellitus without complications: Secondary | ICD-10-CM | POA: Diagnosis not present

## 2021-06-12 DIAGNOSIS — D649 Anemia, unspecified: Secondary | ICD-10-CM | POA: Diagnosis not present

## 2021-06-12 DIAGNOSIS — I81 Portal vein thrombosis: Secondary | ICD-10-CM | POA: Diagnosis not present

## 2021-06-15 DIAGNOSIS — I10 Essential (primary) hypertension: Secondary | ICD-10-CM | POA: Diagnosis not present

## 2021-06-15 DIAGNOSIS — F32A Depression, unspecified: Secondary | ICD-10-CM | POA: Diagnosis not present

## 2021-06-15 DIAGNOSIS — K769 Liver disease, unspecified: Secondary | ICD-10-CM | POA: Diagnosis not present

## 2021-06-19 DIAGNOSIS — I10 Essential (primary) hypertension: Secondary | ICD-10-CM | POA: Diagnosis not present

## 2021-06-19 DIAGNOSIS — K7682 Hepatic encephalopathy: Secondary | ICD-10-CM | POA: Diagnosis not present

## 2021-06-19 DIAGNOSIS — K769 Liver disease, unspecified: Secondary | ICD-10-CM | POA: Diagnosis not present

## 2021-06-20 DIAGNOSIS — I10 Essential (primary) hypertension: Secondary | ICD-10-CM | POA: Diagnosis not present

## 2021-06-20 DIAGNOSIS — K769 Liver disease, unspecified: Secondary | ICD-10-CM | POA: Diagnosis not present

## 2021-06-20 DIAGNOSIS — S52562A Barton's fracture of left radius, initial encounter for closed fracture: Secondary | ICD-10-CM | POA: Diagnosis not present

## 2021-06-21 DIAGNOSIS — K7460 Unspecified cirrhosis of liver: Secondary | ICD-10-CM | POA: Diagnosis not present

## 2021-06-21 DIAGNOSIS — D638 Anemia in other chronic diseases classified elsewhere: Secondary | ICD-10-CM | POA: Diagnosis not present

## 2021-06-21 DIAGNOSIS — K7682 Hepatic encephalopathy: Secondary | ICD-10-CM | POA: Diagnosis not present

## 2021-06-21 DIAGNOSIS — D696 Thrombocytopenia, unspecified: Secondary | ICD-10-CM | POA: Diagnosis not present

## 2021-06-22 DIAGNOSIS — L89610 Pressure ulcer of right heel, unstageable: Secondary | ICD-10-CM | POA: Diagnosis not present

## 2021-06-26 DIAGNOSIS — K769 Liver disease, unspecified: Secondary | ICD-10-CM | POA: Diagnosis not present

## 2021-06-26 DIAGNOSIS — E46 Unspecified protein-calorie malnutrition: Secondary | ICD-10-CM | POA: Diagnosis not present

## 2021-06-26 DIAGNOSIS — F32A Depression, unspecified: Secondary | ICD-10-CM | POA: Diagnosis not present

## 2021-06-26 DIAGNOSIS — I10 Essential (primary) hypertension: Secondary | ICD-10-CM | POA: Diagnosis not present

## 2021-06-27 DIAGNOSIS — I10 Essential (primary) hypertension: Secondary | ICD-10-CM | POA: Diagnosis not present

## 2021-06-28 DIAGNOSIS — S6292XD Unspecified fracture of left wrist and hand, subsequent encounter for fracture with routine healing: Secondary | ICD-10-CM | POA: Diagnosis not present

## 2021-06-28 DIAGNOSIS — K769 Liver disease, unspecified: Secondary | ICD-10-CM | POA: Diagnosis not present

## 2021-06-28 DIAGNOSIS — I10 Essential (primary) hypertension: Secondary | ICD-10-CM | POA: Diagnosis not present

## 2021-06-28 DIAGNOSIS — K7682 Hepatic encephalopathy: Secondary | ICD-10-CM | POA: Diagnosis not present

## 2021-06-29 DIAGNOSIS — L89610 Pressure ulcer of right heel, unstageable: Secondary | ICD-10-CM | POA: Diagnosis not present

## 2021-06-30 DIAGNOSIS — F32A Depression, unspecified: Secondary | ICD-10-CM | POA: Diagnosis not present

## 2021-06-30 DIAGNOSIS — K7682 Hepatic encephalopathy: Secondary | ICD-10-CM | POA: Diagnosis not present

## 2021-06-30 DIAGNOSIS — I10 Essential (primary) hypertension: Secondary | ICD-10-CM | POA: Diagnosis not present

## 2021-06-30 DIAGNOSIS — K769 Liver disease, unspecified: Secondary | ICD-10-CM | POA: Diagnosis not present

## 2021-06-30 DIAGNOSIS — S6292XD Unspecified fracture of left wrist and hand, subsequent encounter for fracture with routine healing: Secondary | ICD-10-CM | POA: Diagnosis not present

## 2021-07-06 DIAGNOSIS — I10 Essential (primary) hypertension: Secondary | ICD-10-CM | POA: Diagnosis not present

## 2021-07-06 DIAGNOSIS — K769 Liver disease, unspecified: Secondary | ICD-10-CM | POA: Diagnosis not present

## 2021-07-06 DIAGNOSIS — R251 Tremor, unspecified: Secondary | ICD-10-CM | POA: Diagnosis not present

## 2021-07-06 DIAGNOSIS — S6292XD Unspecified fracture of left wrist and hand, subsequent encounter for fracture with routine healing: Secondary | ICD-10-CM | POA: Diagnosis not present

## 2021-07-06 DIAGNOSIS — L89610 Pressure ulcer of right heel, unstageable: Secondary | ICD-10-CM | POA: Diagnosis not present

## 2021-07-10 DIAGNOSIS — I81 Portal vein thrombosis: Secondary | ICD-10-CM | POA: Diagnosis not present

## 2021-07-10 DIAGNOSIS — K7682 Hepatic encephalopathy: Secondary | ICD-10-CM | POA: Diagnosis not present

## 2021-07-10 DIAGNOSIS — E440 Moderate protein-calorie malnutrition: Secondary | ICD-10-CM | POA: Diagnosis not present

## 2021-07-10 DIAGNOSIS — M6281 Muscle weakness (generalized): Secondary | ICD-10-CM | POA: Diagnosis not present

## 2021-07-10 DIAGNOSIS — M6259 Muscle wasting and atrophy, not elsewhere classified, multiple sites: Secondary | ICD-10-CM | POA: Diagnosis not present

## 2021-07-10 DIAGNOSIS — D696 Thrombocytopenia, unspecified: Secondary | ICD-10-CM | POA: Diagnosis not present

## 2021-07-10 DIAGNOSIS — R262 Difficulty in walking, not elsewhere classified: Secondary | ICD-10-CM | POA: Diagnosis not present

## 2021-07-11 DIAGNOSIS — I10 Essential (primary) hypertension: Secondary | ICD-10-CM | POA: Diagnosis not present

## 2021-07-11 DIAGNOSIS — E785 Hyperlipidemia, unspecified: Secondary | ICD-10-CM | POA: Diagnosis not present

## 2021-07-11 DIAGNOSIS — S6292XD Unspecified fracture of left wrist and hand, subsequent encounter for fracture with routine healing: Secondary | ICD-10-CM | POA: Diagnosis not present

## 2021-07-11 DIAGNOSIS — K7682 Hepatic encephalopathy: Secondary | ICD-10-CM | POA: Diagnosis not present

## 2021-07-11 DIAGNOSIS — E440 Moderate protein-calorie malnutrition: Secondary | ICD-10-CM | POA: Diagnosis not present

## 2021-07-11 DIAGNOSIS — M6281 Muscle weakness (generalized): Secondary | ICD-10-CM | POA: Diagnosis not present

## 2021-07-11 DIAGNOSIS — D696 Thrombocytopenia, unspecified: Secondary | ICD-10-CM | POA: Diagnosis not present

## 2021-07-11 DIAGNOSIS — K769 Liver disease, unspecified: Secondary | ICD-10-CM | POA: Diagnosis not present

## 2021-07-11 DIAGNOSIS — M6259 Muscle wasting and atrophy, not elsewhere classified, multiple sites: Secondary | ICD-10-CM | POA: Diagnosis not present

## 2021-07-11 DIAGNOSIS — R262 Difficulty in walking, not elsewhere classified: Secondary | ICD-10-CM | POA: Diagnosis not present

## 2021-07-11 DIAGNOSIS — R251 Tremor, unspecified: Secondary | ICD-10-CM | POA: Diagnosis not present

## 2021-07-11 DIAGNOSIS — I81 Portal vein thrombosis: Secondary | ICD-10-CM | POA: Diagnosis not present

## 2021-07-12 DIAGNOSIS — E440 Moderate protein-calorie malnutrition: Secondary | ICD-10-CM | POA: Diagnosis not present

## 2021-07-12 DIAGNOSIS — I81 Portal vein thrombosis: Secondary | ICD-10-CM | POA: Diagnosis not present

## 2021-07-12 DIAGNOSIS — D696 Thrombocytopenia, unspecified: Secondary | ICD-10-CM | POA: Diagnosis not present

## 2021-07-12 DIAGNOSIS — R262 Difficulty in walking, not elsewhere classified: Secondary | ICD-10-CM | POA: Diagnosis not present

## 2021-07-12 DIAGNOSIS — K7682 Hepatic encephalopathy: Secondary | ICD-10-CM | POA: Diagnosis not present

## 2021-07-12 DIAGNOSIS — M6259 Muscle wasting and atrophy, not elsewhere classified, multiple sites: Secondary | ICD-10-CM | POA: Diagnosis not present

## 2021-07-12 DIAGNOSIS — M6281 Muscle weakness (generalized): Secondary | ICD-10-CM | POA: Diagnosis not present

## 2021-08-17 DEATH — deceased

## 2022-01-28 IMAGING — MG DIGITAL SCREENING BILAT W/ TOMO W/ CAD
6 of 10 series · 6 of 30 positions shown · non-contrast
Comparison: Previous exam(s).

CLINICAL DATA: Screening.

EXAM:
DIGITAL SCREENING BILATERAL MAMMOGRAM WITH TOMO AND CAD

[L MLO synth-2D]
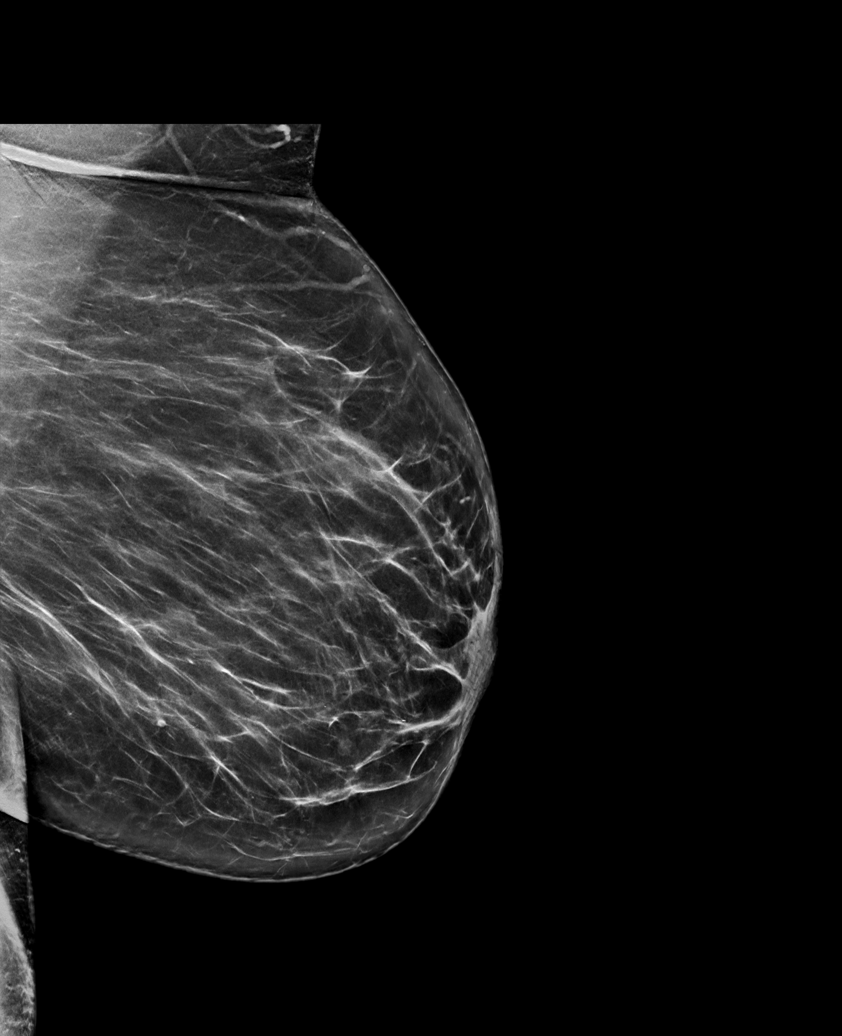

[R MLO synth-2D]
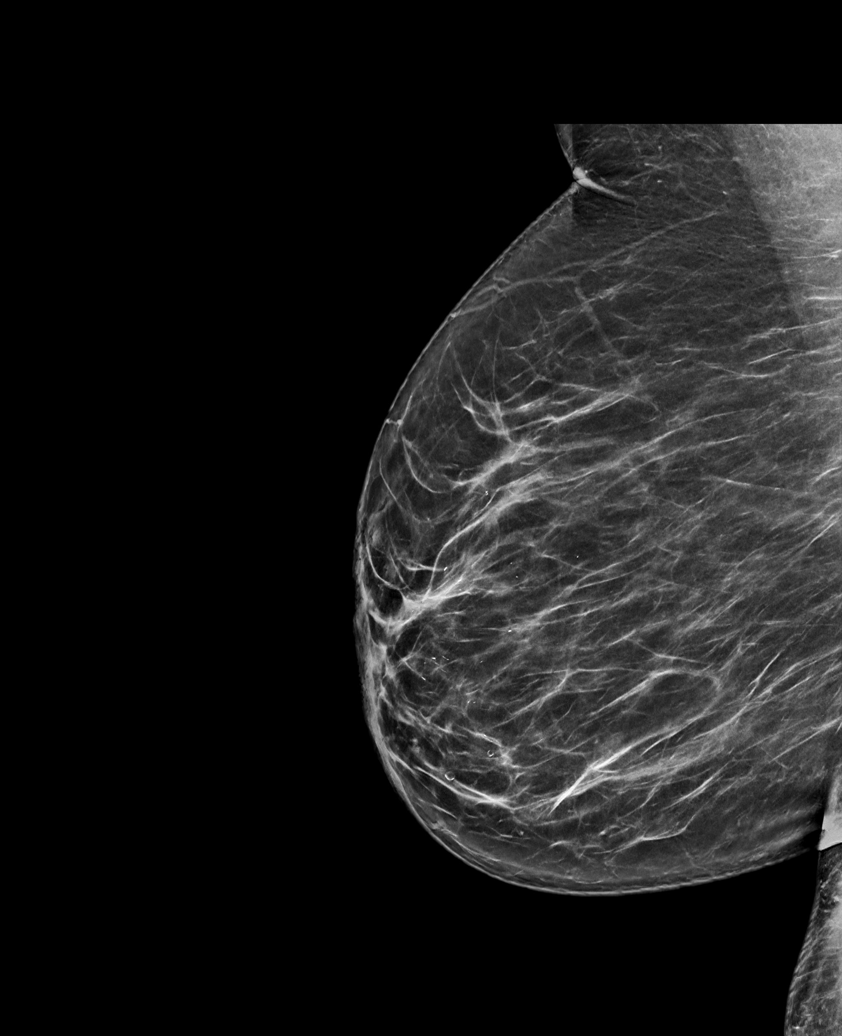

[L CC synth-2D (1 of 2)]
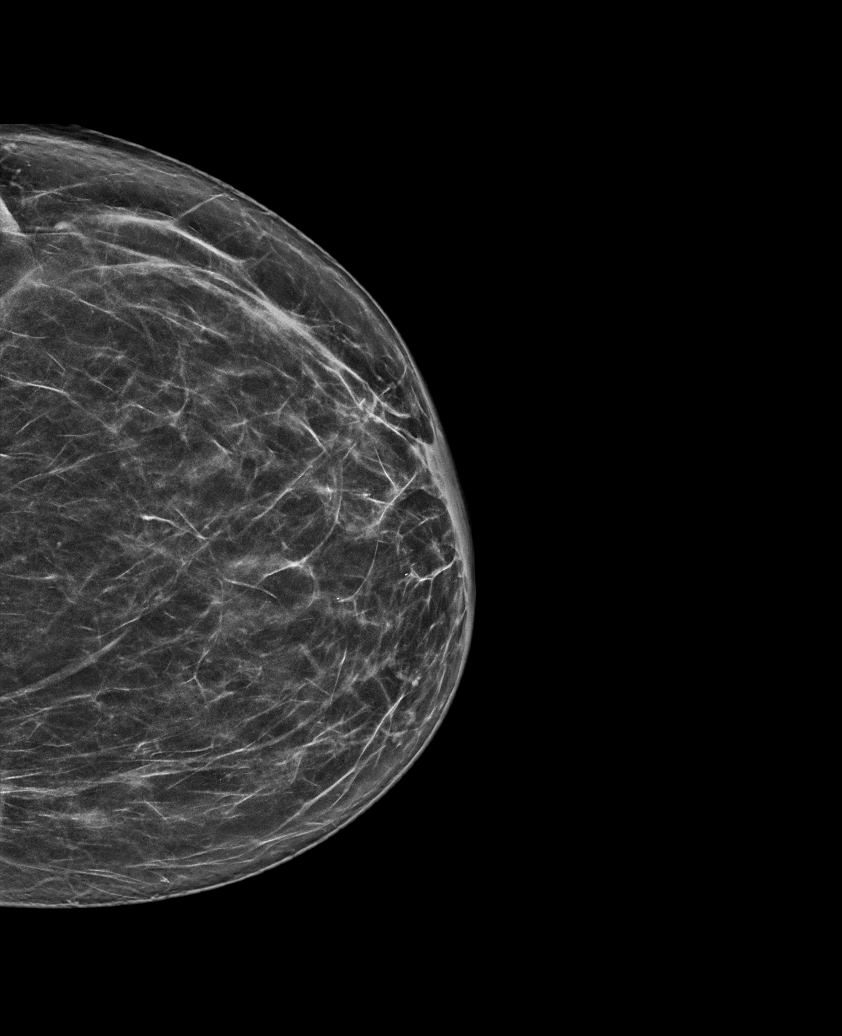

[R CC synth-2D]
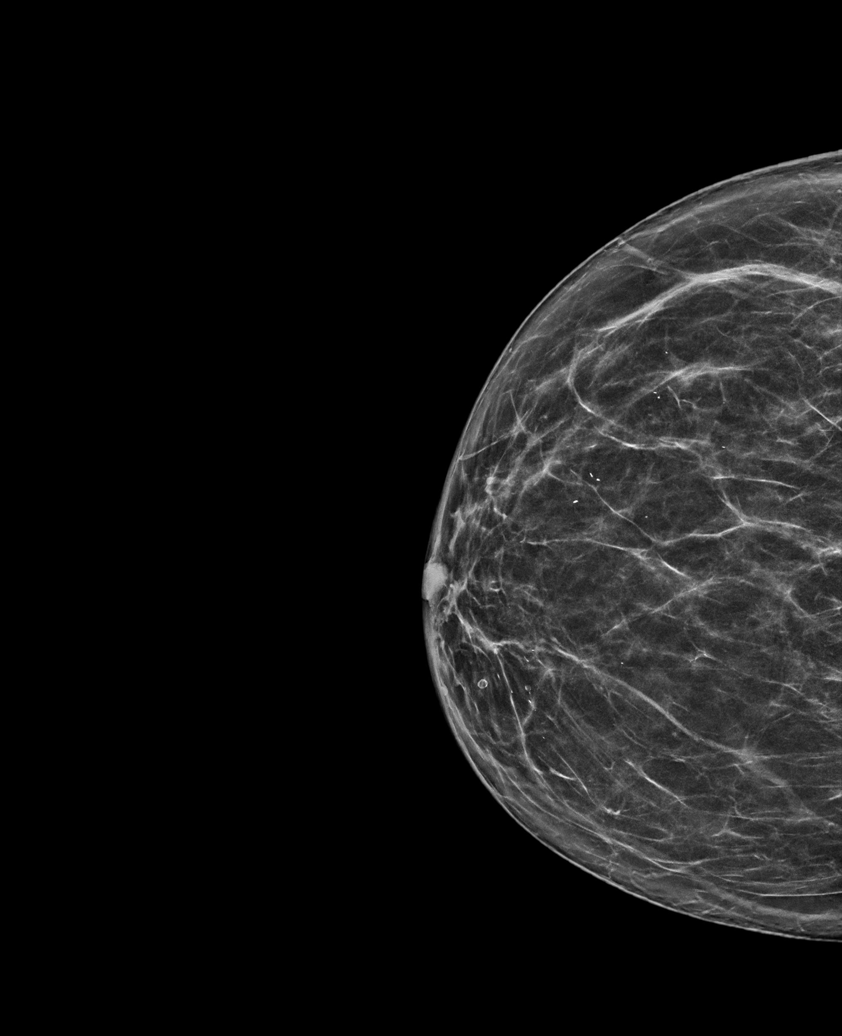

[L CC synth-2D (2 of 2)]
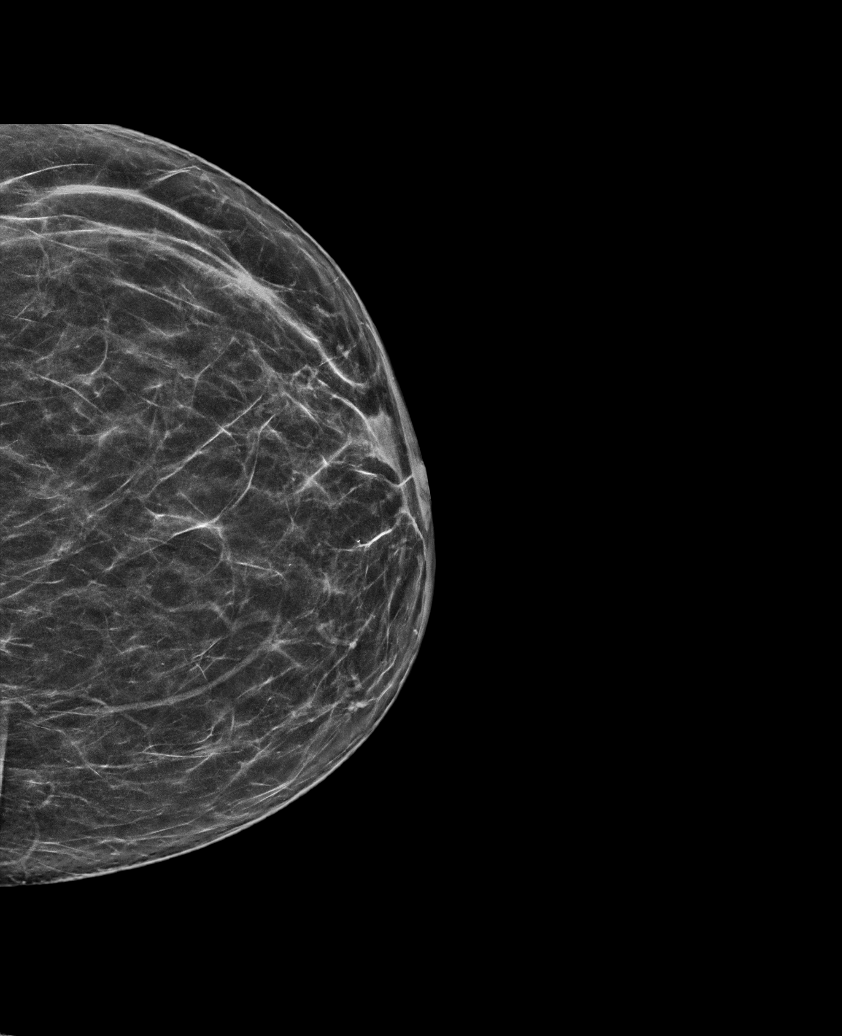

[L MLO tomo · tomo slice 41/82.0]
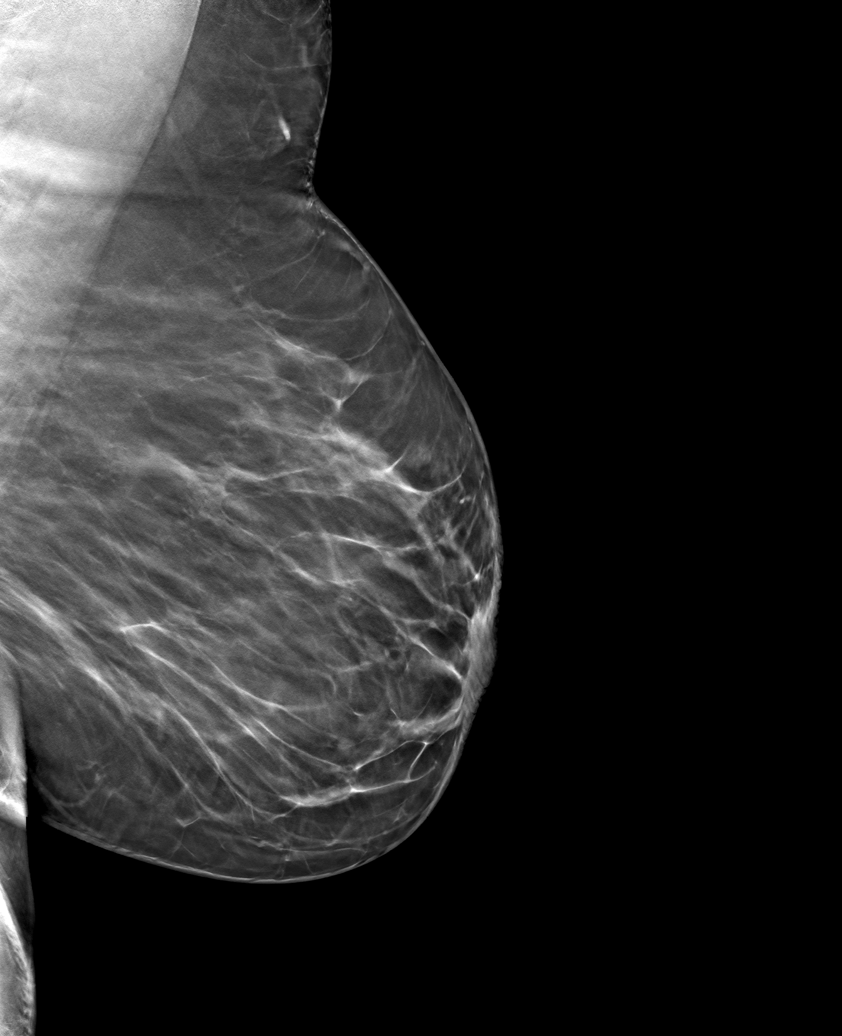

[6 of 30 positions shown; findings below may reference images not displayed]

ACR Breast Density Category b: There are scattered areas of
fibroglandular density.
FINDINGS: There are no findings suspicious for malignancy. Images were
processed with CAD.
IMPRESSION: No mammographic evidence of malignancy. A result letter of this
screening mammogram will be mailed directly to the patient.

RECOMMENDATION:
Screening mammogram in one year. (Code:CN-[id number])

BI-RADS CATEGORY  1: Negative.

## 2023-02-18 IMAGING — US US ABDOMEN LIMITED RUQ/ASCITES
1 series · 14 of 25 positions shown · non-contrast
Comparison: Ultrasound abdomen 07/30/2019, CT abdomen pelvis
08/03/2016

CLINICAL DATA: [place] cirrhosis, HCC screening.  Known gallstones.

EXAM:
ULTRASOUND ABDOMEN LIMITED RIGHT UPPER QUADRANT

[Series 1: us abdomen limited ruq/ascites · 0.30mm/px · 14 of 38 slices shown]
[im 1/38]
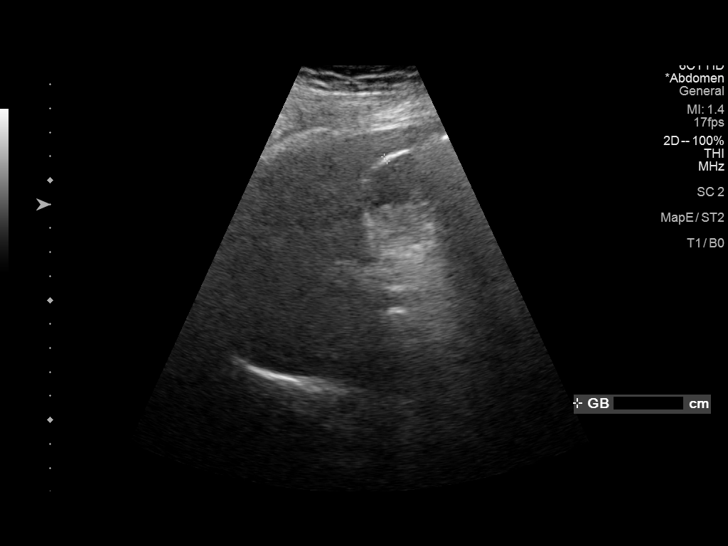
[im 4/38]
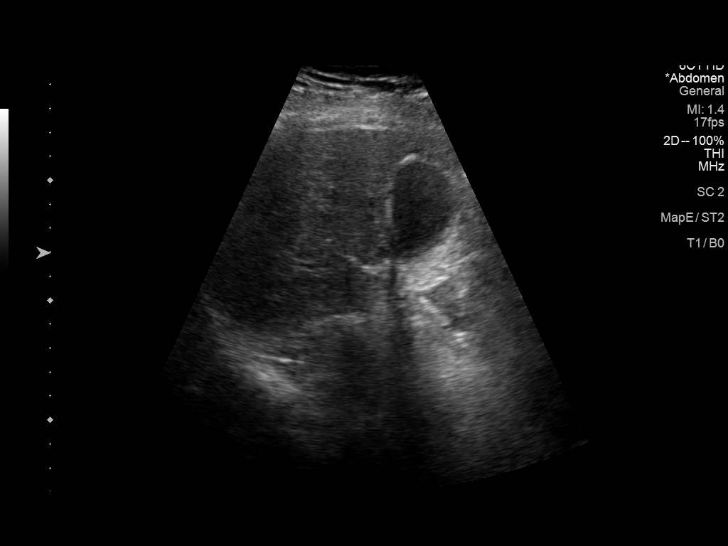
[im 7/38]
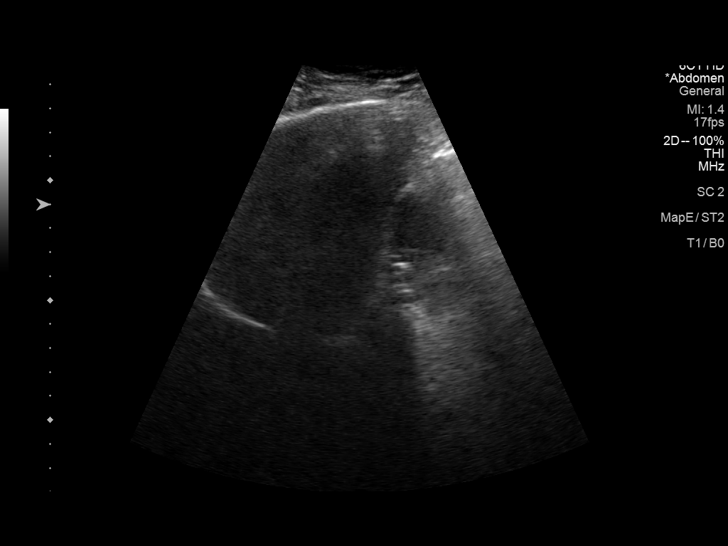
[im 10/38]
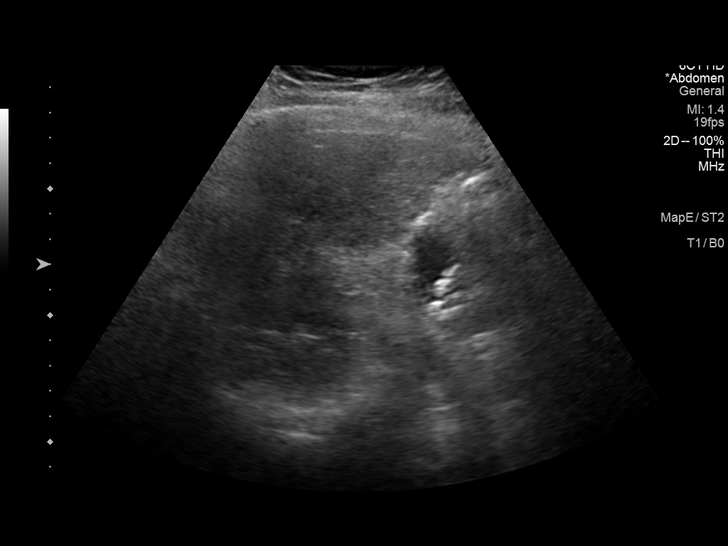
[im 13/38]
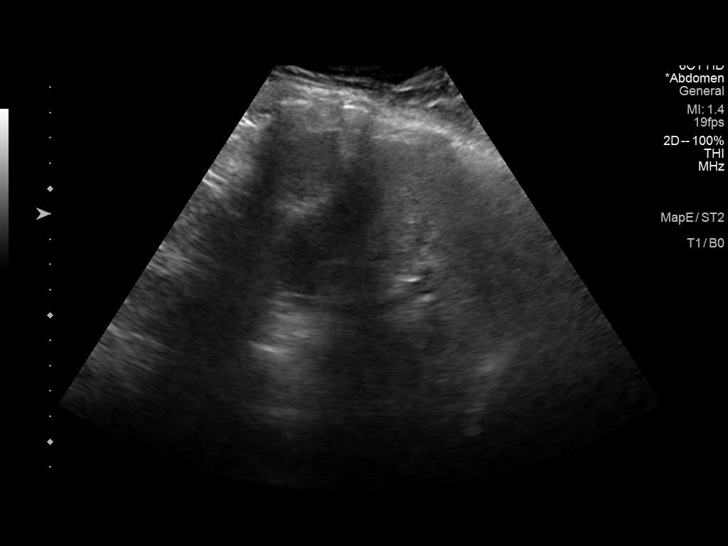
[im 14/38]
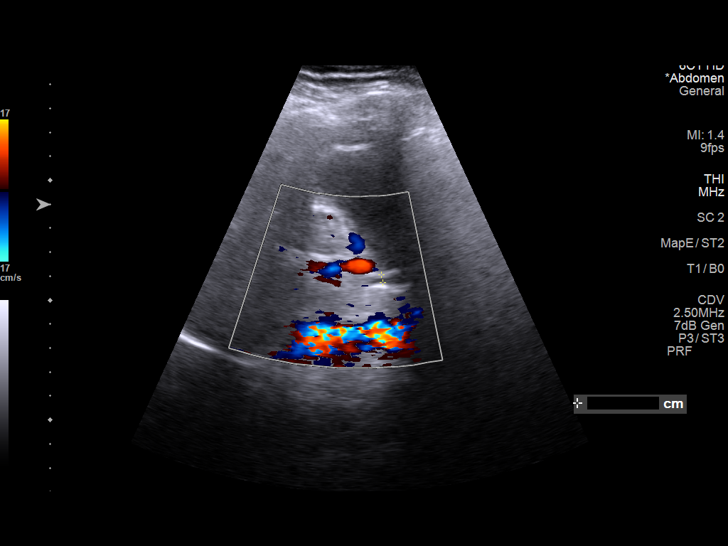
[im 17/38]
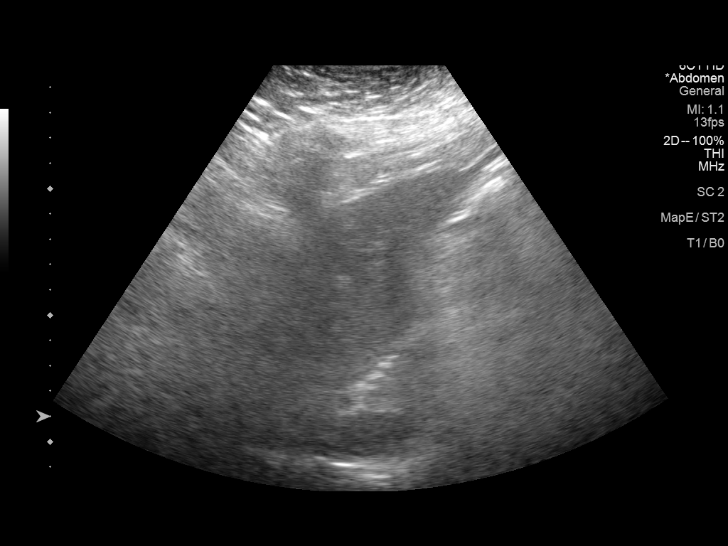
[im 21/38]
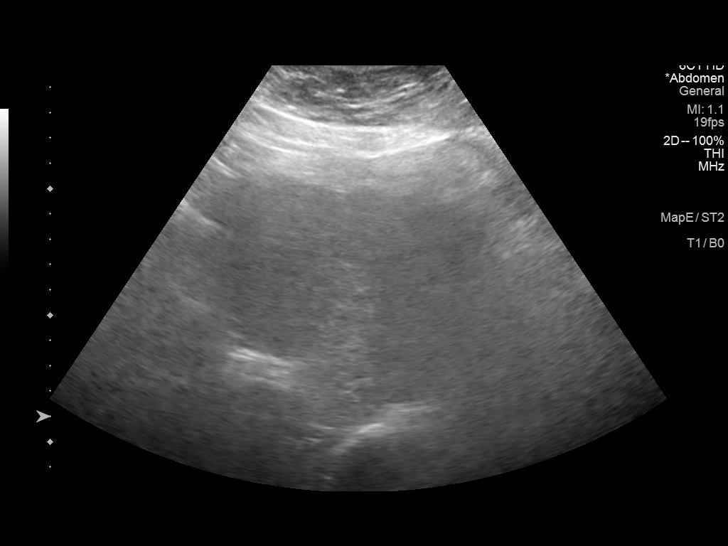
[im 24/38]
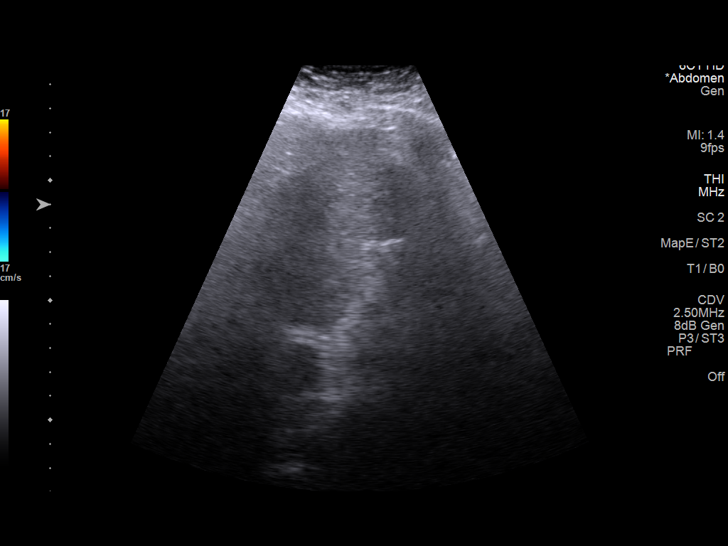
[im 25/38]
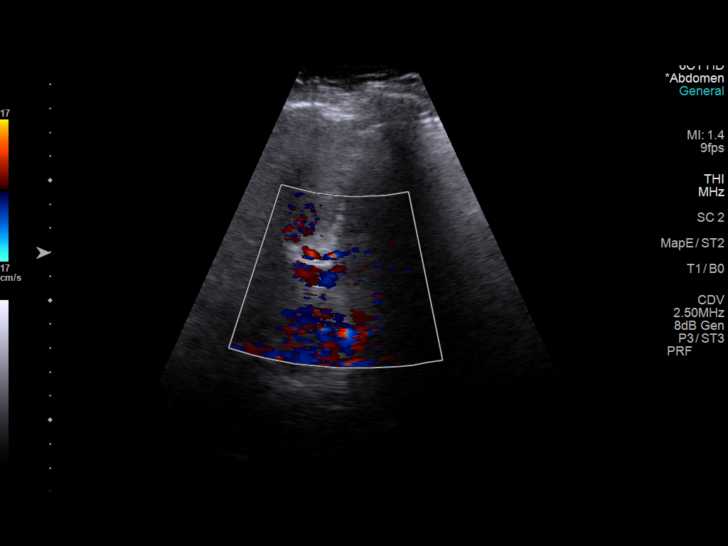
[im 28/38]
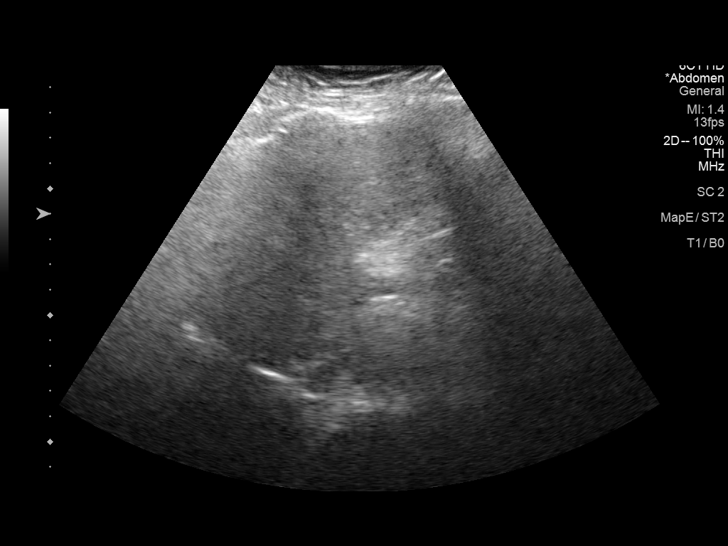
[im 31/38]
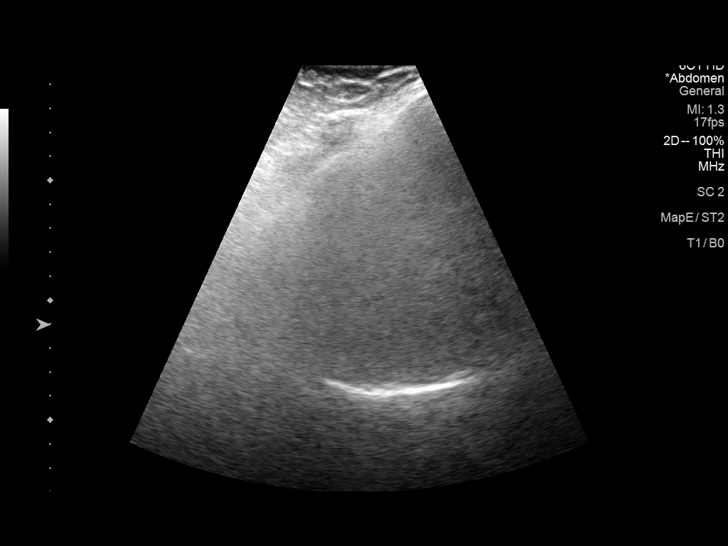
[im 34/38]
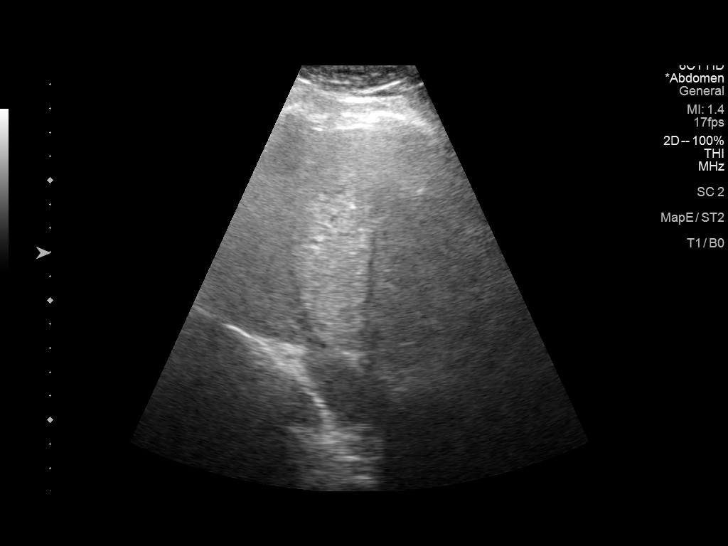
[im 38/38]
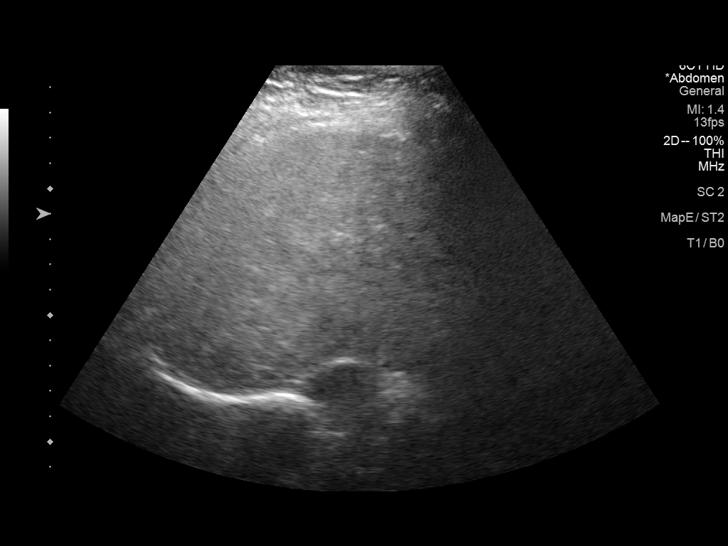

[14 of 25 positions shown; findings below may reference images not displayed]

FINDINGS: Gallbladder:

Calcified gallstones within the gallbladder lumen. No gallbladder
wall thickening visualized. No sonographic Murphy sign noted by
sonographer.

Common bile duct:

Diameter: 3 mm

Liver:

Nodular hepatic contour. No focal lesion identified. Heterogeneous
parenchymal echogenicity. Portal vein is patent on color Doppler
imaging with normal direction of blood flow towards the liver.

Other: Markedly limited evaluation due to gas and body habitus.
IMPRESSION: 1. Cirrhosis with markedly limited evaluated evaluation for focal
hepatic lesion. Recommend MRI liver protocol for further evaluation.
2. Cholelithiasis no findings of acute cholecystitis or
choledocholithiasis.

These results will be called to the ordering clinician or
representative by the Radiologist Assistant, and communication
documented in the PACS or [REDACTED].
